# Patient Record
Sex: Female | Born: 1955 | Race: White | Hispanic: No | Marital: Married | Smoking: Former smoker
Health system: Southern US, Community
[De-identification: ages and names within clinical notes are randomized; demographics above are authoritative.]

## PROBLEM LIST (undated history)

## (undated) DIAGNOSIS — I4891 Unspecified atrial fibrillation: Secondary | ICD-10-CM

## (undated) DIAGNOSIS — J449 Chronic obstructive pulmonary disease, unspecified: Secondary | ICD-10-CM

---

## 2006-07-10 ENCOUNTER — Emergency Department: Payer: Self-pay | Admitting: Emergency Medicine

## 2006-08-14 ENCOUNTER — Emergency Department: Payer: Self-pay | Admitting: Emergency Medicine

## 2007-07-04 ENCOUNTER — Ambulatory Visit: Payer: Self-pay | Admitting: Emergency Medicine

## 2007-07-14 ENCOUNTER — Ambulatory Visit: Payer: Self-pay | Admitting: Internal Medicine

## 2007-10-04 ENCOUNTER — Ambulatory Visit: Payer: Self-pay | Admitting: Emergency Medicine

## 2007-10-07 ENCOUNTER — Ambulatory Visit: Payer: Self-pay | Admitting: Internal Medicine

## 2007-10-09 ENCOUNTER — Emergency Department: Payer: Self-pay | Admitting: Unknown Physician Specialty

## 2007-10-21 ENCOUNTER — Other Ambulatory Visit: Payer: Self-pay

## 2007-10-21 ENCOUNTER — Inpatient Hospital Stay: Payer: Self-pay | Admitting: Internal Medicine

## 2007-10-25 ENCOUNTER — Ambulatory Visit: Payer: Self-pay | Admitting: Gynecology

## 2007-11-05 ENCOUNTER — Ambulatory Visit: Payer: Self-pay | Admitting: Pulmonary Disease

## 2007-11-05 ENCOUNTER — Ambulatory Visit (HOSPITAL_COMMUNITY): Admission: RE | Admit: 2007-11-05 | Discharge: 2007-11-05 | Payer: Self-pay | Admitting: Obstetrics & Gynecology

## 2007-11-13 ENCOUNTER — Ambulatory Visit: Payer: Self-pay | Admitting: Pulmonary Disease

## 2007-11-28 DIAGNOSIS — R0989 Other specified symptoms and signs involving the circulatory and respiratory systems: Secondary | ICD-10-CM

## 2007-11-28 DIAGNOSIS — R0609 Other forms of dyspnea: Secondary | ICD-10-CM | POA: Insufficient documentation

## 2007-11-28 DIAGNOSIS — J45909 Unspecified asthma, uncomplicated: Secondary | ICD-10-CM | POA: Insufficient documentation

## 2007-11-28 DIAGNOSIS — E039 Hypothyroidism, unspecified: Secondary | ICD-10-CM | POA: Insufficient documentation

## 2007-12-18 ENCOUNTER — Ambulatory Visit: Payer: Self-pay | Admitting: Internal Medicine

## 2008-01-28 ENCOUNTER — Encounter (INDEPENDENT_AMBULATORY_CARE_PROVIDER_SITE_OTHER): Payer: Self-pay | Admitting: Gynecology

## 2008-01-28 ENCOUNTER — Inpatient Hospital Stay (HOSPITAL_COMMUNITY): Admission: RE | Admit: 2008-01-28 | Discharge: 2008-01-30 | Payer: Self-pay | Admitting: Gynecology

## 2008-01-28 ENCOUNTER — Ambulatory Visit: Payer: Self-pay | Admitting: Gynecology

## 2008-02-07 ENCOUNTER — Ambulatory Visit: Payer: Self-pay | Admitting: Gynecology

## 2008-02-14 ENCOUNTER — Ambulatory Visit: Payer: Self-pay | Admitting: Gynecology

## 2008-03-02 ENCOUNTER — Ambulatory Visit: Payer: Self-pay | Admitting: Internal Medicine

## 2008-04-15 ENCOUNTER — Ambulatory Visit: Payer: Self-pay | Admitting: Family Medicine

## 2008-06-04 IMAGING — CR DG CHEST 2V
1 series · 2 of 2 positions shown · non-contrast
Comparison: none

REASON FOR EXAM: short of breath
COMMENTS:

[Series 1: view not recorded · 0.17mm/px · 2 of 2 slices shown]
[im 1/2]
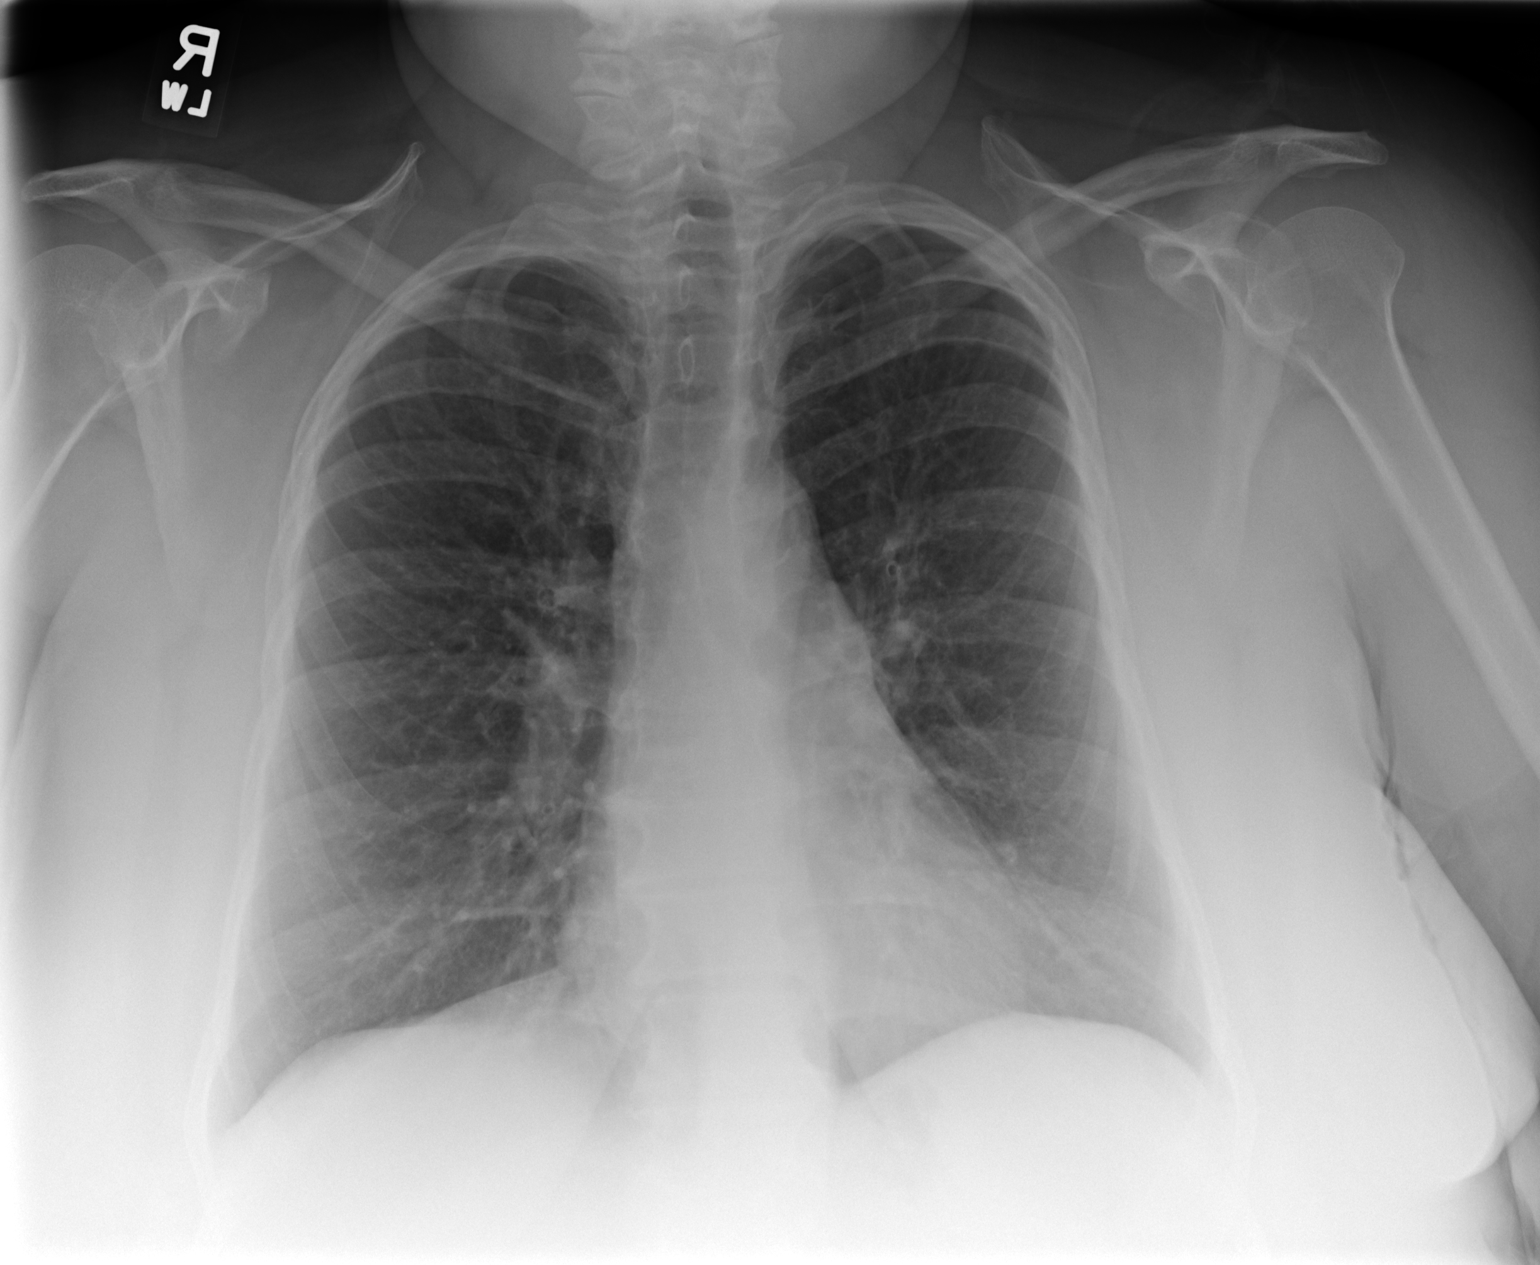
[im 2/2]
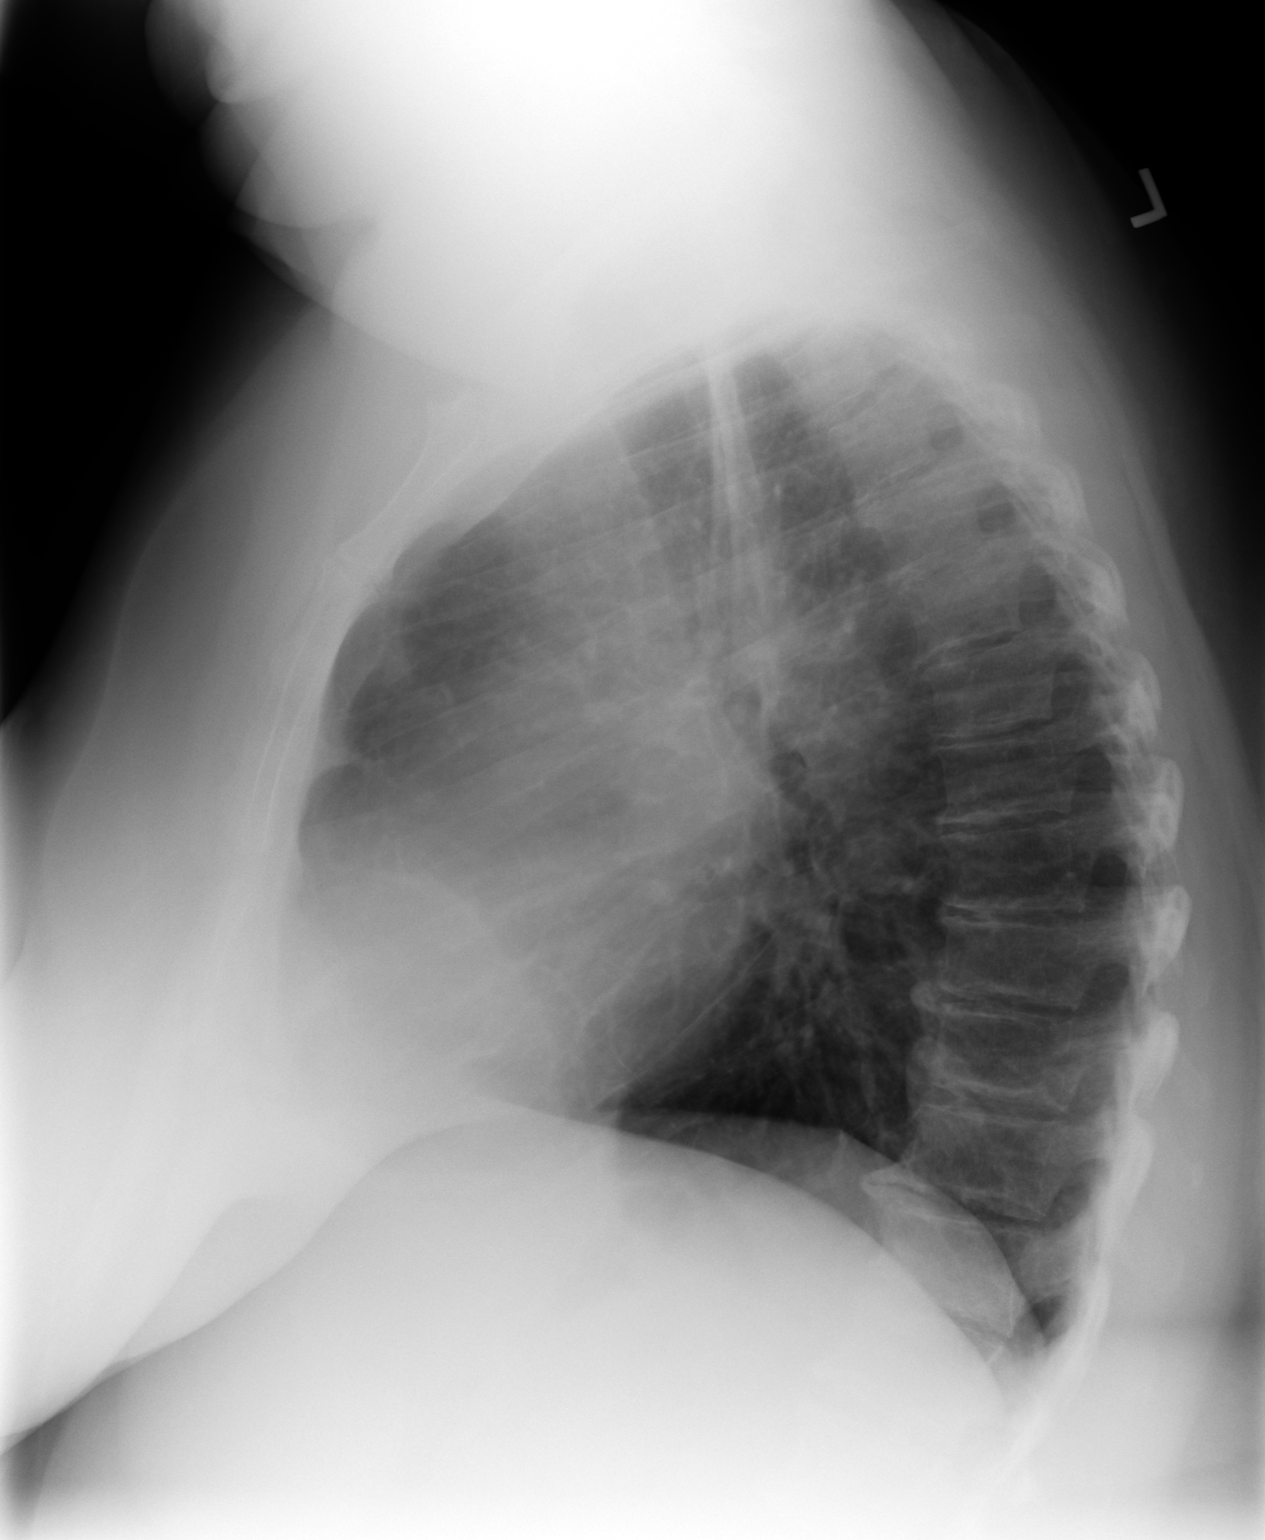

[2 of 2 positions shown; findings below may reference images not displayed]

PROCEDURE:     MDR - MDR CHEST PA(OR AP) AND LATERAL  - March 02, 2008 [DATE]

RESULT:     There is no previous exam for comparison. There is mild
hyperinflation. There are degenerative changes in the spine. The lungs are
clear. The heart and pulmonary vessels are normal. The bony and mediastinal
structures are unremarkable. There is no effusion. There is no pneumothorax
or evidence of congestive failure.
IMPRESSION: No acute cardiopulmonary disease.

## 2008-06-22 ENCOUNTER — Ambulatory Visit: Payer: Self-pay | Admitting: Family Medicine

## 2008-07-21 ENCOUNTER — Ambulatory Visit: Payer: Self-pay | Admitting: Internal Medicine

## 2009-05-17 ENCOUNTER — Emergency Department: Payer: Self-pay | Admitting: Emergency Medicine

## 2010-09-29 ENCOUNTER — Ambulatory Visit: Payer: Self-pay | Admitting: Family Medicine

## 2011-03-08 ENCOUNTER — Ambulatory Visit: Payer: Self-pay | Admitting: Internal Medicine

## 2011-04-26 NOTE — Op Note (Signed)
Tasha [name], Tasha [name]           ACCOUNT NO.:  [id number]   MEDICAL RECORD NO.:  [id number]          PATIENT TYPE:  INP   LOCATION:  [id number]                          FACILITY:  [facility]   PHYSICIAN:  Willey Blade, MD  DATE OF BIRTH:  16-Mar-1956   DATE OF PROCEDURE:  01/28/2008  DATE OF DISCHARGE:                               OPERATIVE REPORT   PREOPERATIVE DIAGNOSES:  Chronic pelvic pain and metrorrhagia.   POSTOPERATIVE DIAGNOSES:  Chronic pelvic pain and metrorrhagia.  Bilateral endometriosis of ovaries and tubes.  Appendiceal to right  ovarian adhesions.  Right ovarian cystic masses.   PROCEDURE:  Supracervical hysterectomy, bilateral salpingo-oophorectomy  and appendectomy.  Peritoneal washings.   SURGEON:  Dr. Burke Keels.   ASSISTANT:  Dr. Kalman Shan.   ESTIMATED BLOOD LOSS:  -200 mL.   COMPLICATIONS:  None immediate.   SPECIMEN:  Uterus, portion of cervix, right and left tubes, and ovaries  and appendix to pathology.   OPERATIVE FINDINGS:  Upon opening the abdomen, there was no ascitic  fluid noted.  Two multicystic lesions coming off the right ovary were  noted measuring 5 x 5 cm each.  There was endometriotic lesions on the  surface of the right ovary.  The left ovary demonstrated endometrioma  which had opened during the course of the procedure.  The appendix was  adherent to the right ovary and had to be dissected away.  Due to the  patient's significantly elevated BMI, a supracervical hysterectomy was  performed; due to restrictions in safe exposure deep in the pelvis and  the risk of heavy bleeding that could not be controlled, had the  procedure continued to a total abdominal hysterectomy.  Upper abdomen  was normal.  Gallbladder previously removed.  Periaortic and pelvic  lymph nodes were palpated and found to be normal.  Both ureters were  identified throughout their pelvic course during the procedure.  The two  ovarian cystic lesions did not demonstrate surface  excrescences and  appeared to be clear, and frozen section demonstrated fibroadenoma.  This was performed on the right ovary.  The left ovary was an  endometrioma and pathologist deferred frozen section.  The uterus  appeared to be slightly enlarged.   OPERATIVE PROCEDURE:  The patient was prepped and draped in usual  fashion and placed in the supine position.  Betadine solution used for  antiseptic, and the patient was catheterized prior to procedure.  After  adequate general anesthesia, a vertical infraumbilical incision was made  and the abdomen opened.  Self-retaining retractors using a Bookwalter  were utilized.  At this point, the round ligaments were clamped, cut and  ligated with 0 Vicryl suture.  The lateral peritoneal reflection was  opened and the ureters identified.  Both infundibulopelvic ligaments  were bilaterally clamped, cut and ligated with 0 Vicryl suture.  At this  point, the anterior and posterior peritoneal reflections were opened.  Bladder was developed in a standard Richardson fashion.  The uterine  vasculature was clamped and ligated with 0 Vicryl suture.  As described  above due to restriction in visualization, a supracervical hysterectomy  was performed.  The cervix was oversewn with 0 Vicryl running  interlocking suture.  Bleeding points hemostatically checked.  Copious  irrigation utilized.  No active bleeding noted.   Appendectomy performed by clamping and cutting and tying the  mesoappendix to the base.  At this point, the base of the appendix was  tied with 0 Vicryl suture x2, and afterwards, the appendix was cut above  said suture knots, leaving a small tip.  Tip was cleansed with iodine  and then copious irrigation with lactated Ringer's followed.  No active  bleeding in the mesoappendix was noted.  Reinspection of the pelvis  revealed no active bleeding.  All irrigant removed.  Closure of the  fascia with a double loop of 0 PDS running from either end  to midline.  A Jackson-Pratt drain was utilized for the subcutaneous layer.  Subcutaneous layer closed with 0 plain catgut suture and skin staples  for the skin.  Instrument and sponge count were correct.  The patient  tolerated the procedure well and returned to the post-anesthesia  recovery room in excellent condition.      Willey Blade, MD  Electronically Signed     SHB/MEDQ  D:  01/28/2008  T:  01/29/2008  Job:  [id number]

## 2011-04-26 NOTE — Assessment & Plan Note (Signed)
Tasha [name]                             PULMONARY OFFICE NOTE   OLUWASEUN, TANDE                    MRN:          [id number]  DATE:11/05/2007                            DOB:          11-10-1956    HISTORY OF PRESENT ILLNESS:  The patient is a 55 year old white female,  whom I have been asked to see for preop pulmonary clearance for total  abdominal hysterectomy.  The patient has a long history of smoking as  well as asthma and also morbid obesity.  By the patient's history, she  had an episode of pneumonia approximately 3 weeks ago and was  hospitalized at [facility].  We do have a copy of an x-ray that showed a  right middle lobe infiltrate per report.  The patient feels that she is  much improved and is almost back to her usual baseline.  She is having  no fevers, chills, sweats, or purulence.  The patient, at baseline, has  less than 1 block dyspnea on exertion at a moderate pace.  She does not  get short of breath vacuuming her house but will bringing groceries in  from the car.  She has no significant cough or mucus production at this  time.  She does have chronic lower extremity edema for which she is on  diuretics.   PAST MEDICAL HISTORY:  1. Asthma.  2. History of multiple orthopedic procedures.  3. History of hypothyroidism for which she is on replacement.   CURRENT MEDICATIONS:  1. Advair 250/50, 1 b.i.d.  2. Ipratropium and albuterol nebulizer treatments q.i.d. p.r.n.  3. Nexium 40 mg daily.  4. Lasix 40 mg b.i.d.  5. Synthroid of unknown dose daily.   She has no known drug allergies.   SOCIAL HISTORY:  She is separated.  She has a history of smoking 1-2  packs per day for approximately 18 years.  She is currently smoking 1/2  pack a day of cigarettes.  She lives alone.   FAMILY HISTORY:  Remarkable for her father having asthma and otherwise  noncontributory in first-degree relatives.   REVIEW OF SYSTEMS:  As per history of  present illness.  Also, see  patient intake form documented in the chart.   PHYSICAL EXAM:  GENERAL:  She is a morbidly obese white female in no  acute distress.  Blood pressure is 128/74, pulse 82, temperature 97.8, weight 344 pounds.  She is 5 feet 1 inch tall.  O2 saturation on room air is 96%.  HEENT:  Pupils equal, round, and reactive to light and accommodation.  Extraocular muscles are intact.  Nares are patent without discharge but  narrowed.  Oropharynx is clear.  NECK:  Supple without JVD or lymphadenopathy.  There is no palpable  thyromegaly.  CHEST:  Clear to auscultation except faint basilar crackles.  CARDIAC:  Regular rate and rhythm.  ABDOMEN:  Large but nontender with good bowel sounds.  GENITAL/RECTAL/BREAST EXAM:  Not done and not indicated.  LOWER EXTREMITIES:  2+ edema.  Pulses are intact distally.  NEUROLOGIC:  Alert and oriented with no obvious motor deficits.  IMPRESSION:  Dyspnea on exertion secondary to morbid obesity and  probable underlying obstructive lung disease.  In terms of her  postoperative risks, I am most concerned about her morbid obesity more  so than her smoking and possible obstructive lung disease.  I do think  she needs PFTs to give me some idea as to her risks from a pulmonary  standpoint.  I have explained to the patient that it is absolutely  essential that she quit smoking 2 weeks prior to any form of general  anesthesia or other surgeries.  I have also asked her to work  aggressively on losing weight.  Again, I think her morbid obesity is  probably more of a risk than her actual pulmonary status.   PLAN:  1. Full PFTs as soon as possible.  2. Work on weight loss.  3. Stop smoking at least 2 weeks prior to surgery.  4. The patient will obviously need early out-of-bed, work with      incentive spirometry, as well as aggressive deep venous thrombosis      prophylaxis.  5. The patient will follow up as soon as her PFTs are done.      Kathee Delton, MD,FCCP  Electronically Signed    [facility]/MedQ  DD: 11/05/2007  DT: 11/05/2007  Job #: [id number]   cc:   Willey Blade, MD

## 2011-04-26 NOTE — H&P (Signed)
Tasha, [name]           ACCOUNT NO.:  [id number]   MEDICAL RECORD NO.:  [id number]          PATIENT TYPE:  AMB   LOCATION:  [place]                           FACILITY:  [facility]   PHYSICIAN:  Willey Blade, MD  DATE OF BIRTH:  [birth year]   DATE OF ADMISSION:  12/20/2007  DATE OF DISCHARGE:                              HISTORY & PHYSICAL   REASON FOR ADMISSION:  Leiomyomatous uterus and menometrorrhagia.   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  The patient states  that her menses last anywhere from 7-14 days over a 30-day cycle.  Because of her hypertension and heavy smoking history, she is not a  candidate for oral contraceptives.  She takes no medication to enhance  her bleeding propensity and has no personal family history of bleeding  diatheses.  Endometrial biopsy reveals no evidence of neoplasia or  hyperplasia and ultrasound confirms a 14-16-week leiomyomatous uterus.   OB/GYN HISTORY:  The patient has had three vaginal deliveries in the  past and currently has no method of contraception.   MEDICAL HISTORY:  The patient has a known asthma and hypertension   PAST SURGICAL HISTORY:  Right arm surgery, right foot surgery and open  cholecystectomy and skin graft on her right arm.   ALLERGIES:  None.   CURRENT MEDICATIONS:  1. Nexium 40 mg daily.  2. Albuterol inhaler 2 puffs IV 6 hours as needed.  3. Furosemide 40 mg per day.  4. Advair inhaler use this p.r.n.  5. Prednisone dose unknown.  She takes one daily on a tapered dose.  6. Ventolin inhaler 1-2 puffs up to four times a day.  7. Hydrochlorothiazide 50 mg daily.   SOCIAL HISTORY:  The patient is separated. She smokes about a half-pack  of cigarettes a day down from two packs daily. She  drinks rarely alcohol  and does not abuse illicit drugs.   FAMILY HISTORY:  Positive for coronary artery disease and her mother  having had a myocardial infarction in her 71s.   PHYSICAL EXAMINATION:  A pleasant-appearing female in no acute distress.  Weight is 342 pounds, blood pressure 160/90, heart rate is 70 and  regular.  HEENT:  Grossly normal.  BREASTS:  Examined without mass or discharge, thickenings or tenderness.  CHEST:  Clear to percussion and auscultation.  CARDIOVASCULAR:  Exam without murmurs or enlargements.  Regular rate and  rhythm.  Extremity, lymphatic, skin, neurological and musculoskeletal  systems  within normal limits.  ABDOMEN:  Soft without gross hepatosplenomegaly. Palpable  lower pelvic  mass consistent with leiomyomata at about 16 weeks size. Pelvic exam  normal Pap smear.  External genitalia, vulva and vagina normal.  Cervix smooth without  erosions or lesions.  The uterus is about 16 weeks in size, difficult to  evaluate due to her size.  Both adnexa palpable, again difficult to  evaluate but otherwise appears to be  within normal limits.   CONSULTATIONS:  The patient was referred to Dr. Verlon Setting from [place] for clearance for surgery.  The patient underwent  a technetium nuclear cardiology report with an overall impression of a  low-risk stress nuclear study, no evidence of inducible ischemia and  normal left ventricular systolic function with an ejection fraction of  63%. The patient denies chest pain on exertion.  She also underwent  pulmonary function testing which demonstrated mild air flow obstruction  with no change bronchodilators. Lung volumes were normal with mild air  trapping.   The patient was advised by pulmonary to stop smoking and also was asked  to refrain from smoking postoperatively.   IMPRESSION:  1. Abnormal uterine bleeding.  2. Leiomyomatous uterus.   PLAN:  The patient is not a candidate for  endometrial ablation due to an  enlarged uterus and is not a candidate for oral contraception secondary  to her hypertension and smoking history. In addition, she declined would  not be a candidate for Mirena intrauterine device due to her uterine  size.  The patient accepted and agreed and was cleared for a total  abdominal hysterectomy and bilateral salpingo-oophorectomy.  The nature  of said procedure discussed in detail. The risks including possible  injuries to ureter, bowel and bladder, hemorrhage possibly requiring a  blood transfusion, infection, cardiopulmonary complications  intraoperatively or postoperatively and infection were discussed and  understood by said patient.  The patient verbalized understanding of  said procedure, nature of risks and benefits.      Willey Blade, MD  Electronically Signed     SHB/MEDQ  D:  12/17/2007  T:  12/17/2007  Job:  [id number] [id number]

## 2011-04-26 NOTE — Discharge Summary (Signed)
Tasha [name], [name]           ACCOUNT NO.:  [id number]   MEDICAL RECORD NO.:  [id number]          PATIENT TYPE:  INP   LOCATION:  [id number]                          FACILITY:  [facility]   PHYSICIAN:  Willey Blade, MD  DATE OF BIRTH:  03-08-1956   DATE OF ADMISSION:  01/28/2008  DATE OF DISCHARGE:  01/30/2008                               DISCHARGE SUMMARY   REASON FOR HOSPITALIZATION:  Chronic pelvic pain, menometrorrhagia.   IN-HOSPITAL PROCEDURES:  1. Supracervical hysterectomy, bilateral salpingo-oophorectomy.  2. Appendectomy.   FINAL DIAGNOSIS:  Chronic pelvic pain, menometrorrhagia, left ovarian  endometrioma, right ovarian cysts and appendiceal and right ovarian  adhesive disease.   HOSPITAL COURSE:  This patient is a 55 year old Caucasian female who  underwent the aforementioned procedure on January 28, 2008.  Of note is  that the patient demonstrated evidence for two large cystic masses  measuring about 4 to 5 cm attached to the right ovary.  The frozen  section return as benign tissue without evidence for borderline or  malignant tumor.  An endometrioma was noted in the left ovary after an  inadvertent rupture and dark chocolate cystic material was noted.  Her  appendix was affixed to the right ovary and was therefore removed.  Her  postoperative course was uneventful.  She was afebrile.  Creatinine 0.87  postoperatively and her H and H was 36.2 and 12.1.  She had minimal  Jackson-Pratt drainage from the subcutaneous site.  The abdomen was  soft.  The incisions were dry.  Calves without tenderness.  The patient  was passing flatus at the time of discharge.   The discharge planning included sending the patient home with Percocet  5/325 1-2 every 4-6 hours and Keflex  500 mg twice daily.  She will continue her home medications including  Lasix 40 mg daily, Nexium 40 mg daily, Advair 250/50 daily, Albuterol,  Ipratropium nebulizer treatments as needed, K-Lor/Con 20 mEq  daily,  Hydroxyzine 500 mg daily, __________ 30 mg daily.  The patient was  advised to contact the office for temperature elevation above 100.4  degrees Fahrenheit, increasing abdominal or incisional pain, increasing  Jackson-Pratt drainage, vaginal bleeding and/or any other concerns.  She  is scheduled to return to the [facility] office on the 26th of  February for removal of her Jackson-Pratt drain and staples.  Patient  verbalized understanding of all instructions and understood instructions  as well.      Willey Blade, MD  Electronically Signed     SHB/MEDQ  D:  01/30/2008  T:  01/30/2008  Job:  [id number]

## 2011-05-09 ENCOUNTER — Ambulatory Visit: Payer: Self-pay | Admitting: Internal Medicine

## 2011-05-09 ENCOUNTER — Ambulatory Visit: Payer: Self-pay | Admitting: Family Medicine

## 2011-07-19 ENCOUNTER — Ambulatory Visit: Payer: Self-pay | Admitting: Internal Medicine

## 2011-08-03 ENCOUNTER — Ambulatory Visit: Payer: Self-pay | Admitting: Family Medicine

## 2011-08-03 ENCOUNTER — Inpatient Hospital Stay: Payer: Self-pay | Admitting: Internal Medicine

## 2011-09-02 LAB — COMPREHENSIVE METABOLIC PANEL
ALT: 17
AST: 16
Alkaline Phosphatase: 42
BUN: 10
CO2: 25
Calcium: 9.1
Chloride: 102
GFR calc Af Amer: >60
GFR calc non Af Amer: >60
Sodium: 136
Total Protein: 7.1

## 2011-09-02 LAB — CBC
HCT: 36.2
MCHC: 33.2
MCHC: 33.4
RBC: 5.29 — ABNORMAL HIGH
RDW: 16.8 — ABNORMAL HIGH
RDW: 16.6 — ABNORMAL HIGH
WBC: 10

## 2011-09-02 LAB — BASIC METABOLIC PANEL
BUN: 10
Creatinine, Ser: 0.87
GFR calc non Af Amer: >60

## 2011-09-02 LAB — HCG, SERUM, QUALITATIVE: Preg, Serum: NEGATIVE

## 2011-09-02 LAB — TYPE AND SCREEN: ABO/RH(D): A POS

## 2012-10-28 ENCOUNTER — Emergency Department: Payer: Self-pay | Admitting: Emergency Medicine

## 2012-12-02 ENCOUNTER — Emergency Department: Payer: Self-pay | Admitting: Emergency Medicine

## 2012-12-02 LAB — CBC
HCT: 39.7 % (ref 35.0–47.0)
HGB: 12.6 g/dL (ref 12.0–16.0)
MCHC: 31.7 g/dL — ABNORMAL LOW (ref 32.0–36.0)
RDW: 16.2 % — ABNORMAL HIGH (ref 11.5–14.5)

## 2012-12-02 LAB — BASIC METABOLIC PANEL
Calcium, Total: 9.3 mg/dL (ref 8.5–10.1)
Co2: 27 mmol/L (ref 21–32)
EGFR (Non-African Amer.): 55 — ABNORMAL LOW
Potassium: 4.2 mmol/L (ref 3.5–5.1)
Sodium: 139 mmol/L (ref 136–145)

## 2013-03-28 ENCOUNTER — Inpatient Hospital Stay: Payer: Self-pay | Admitting: Internal Medicine

## 2013-03-28 LAB — DIFFERENTIAL: Monocytes: 3 %

## 2013-03-28 LAB — URINALYSIS, COMPLETE
Bilirubin,UR: NEGATIVE
Ph: 5 (ref 4.5–8.0)
RBC,UR: 4 /HPF (ref 0–5)
Squamous Epithelial: 24
WBC UR: 14 /HPF (ref 0–5)

## 2013-03-28 LAB — COMPREHENSIVE METABOLIC PANEL
Anion Gap: 10 (ref 7–16)
Bilirubin,Total: 0.6 mg/dL (ref 0.2–1.0)
Calcium, Total: 8.9 mg/dL (ref 8.5–10.1)
Co2: 24 mmol/L (ref 21–32)
EGFR (African American): 21 — ABNORMAL LOW
EGFR (Non-African Amer.): 18 — ABNORMAL LOW
Osmolality: 274 (ref 275–301)
SGPT (ALT): 9 U/L — ABNORMAL LOW (ref 12–78)
Sodium: 134 mmol/L — ABNORMAL LOW (ref 136–145)

## 2013-03-28 LAB — CK TOTAL AND CKMB (NOT AT ARMC)
CK, Total: 345 U/L — ABNORMAL HIGH (ref 21–215)
CK-MB: 1.9 ng/mL (ref 0.5–3.6)

## 2013-03-28 LAB — CBC
HCT: 34.5 % — ABNORMAL LOW (ref 35.0–47.0)
MCH: 24.4 pg — ABNORMAL LOW (ref 26.0–34.0)
RBC: 4.44 10*6/uL (ref 3.80–5.20)
RDW: 16.8 % — ABNORMAL HIGH (ref 11.5–14.5)

## 2013-03-28 LAB — TROPONIN I: Troponin-I: <0.02 ng/mL

## 2013-03-29 LAB — CBC WITH DIFFERENTIAL/PLATELET
Basophil #: 0 10*3/uL (ref 0.0–0.1)
Eosinophil %: 0.4 %
MCHC: 31.2 g/dL — ABNORMAL LOW (ref 32.0–36.0)
MCV: 78 fL — ABNORMAL LOW (ref 80–100)
Monocyte %: 2.3 %
Platelet: 311 10*3/uL (ref 150–440)
RBC: 4.05 10*6/uL (ref 3.80–5.20)
WBC: 29.6 10*3/uL — ABNORMAL HIGH (ref 3.6–11.0)

## 2013-03-29 LAB — BASIC METABOLIC PANEL
Anion Gap: 8 (ref 7–16)
Calcium, Total: 8.5 mg/dL (ref 8.5–10.1)
Co2: 26 mmol/L (ref 21–32)
EGFR (African American): 20 — ABNORMAL LOW
EGFR (Non-African Amer.): 17 — ABNORMAL LOW
Potassium: 4.1 mmol/L (ref 3.5–5.1)

## 2013-03-30 LAB — BASIC METABOLIC PANEL
Anion Gap: 14 (ref 7–16)
Calcium, Total: 7.7 mg/dL — ABNORMAL LOW (ref 8.5–10.1)
Chloride: 103 mmol/L (ref 98–107)
Co2: 21 mmol/L (ref 21–32)
Creatinine: 2.27 mg/dL — ABNORMAL HIGH (ref 0.60–1.30)
EGFR (African American): 27 — ABNORMAL LOW
EGFR (Non-African Amer.): 24 — ABNORMAL LOW
Glucose: 159 mg/dL — ABNORMAL HIGH (ref 65–99)
Sodium: 138 mmol/L (ref 136–145)

## 2013-03-30 LAB — CBC WITH DIFFERENTIAL/PLATELET
Basophil #: 0 10*3/uL (ref 0.0–0.1)
Basophil %: 0 %
Lymphocyte #: 1.5 10*3/uL (ref 1.0–3.6)
Lymphocyte %: 4.8 %
MCH: 24.7 pg — ABNORMAL LOW (ref 26.0–34.0)
Monocyte %: 2 %
RDW: 17.3 % — ABNORMAL HIGH (ref 11.5–14.5)
WBC: 31.5 10*3/uL — ABNORMAL HIGH (ref 3.6–11.0)

## 2013-03-30 LAB — URINE CULTURE

## 2013-04-03 LAB — CULTURE, BLOOD (SINGLE)

## 2013-05-15 ENCOUNTER — Ambulatory Visit: Payer: Self-pay | Admitting: Family Medicine

## 2013-08-28 ENCOUNTER — Encounter: Payer: Self-pay | Admitting: Surgery

## 2013-12-10 ENCOUNTER — Ambulatory Visit: Payer: Self-pay | Admitting: Internal Medicine

## 2014-12-08 ENCOUNTER — Ambulatory Visit: Payer: Self-pay | Admitting: Family Medicine

## 2015-01-08 ENCOUNTER — Encounter: Payer: Self-pay | Admitting: Surgery

## 2015-01-12 ENCOUNTER — Encounter: Payer: Self-pay | Admitting: Surgery

## 2015-01-13 ENCOUNTER — Encounter: Payer: Self-pay | Admitting: Surgery

## 2015-02-10 ENCOUNTER — Encounter: Payer: Self-pay | Admitting: Surgery

## 2015-04-03 NOTE — H&P (Signed)
PATIENT NAME:  Tasha Long, Tasha Long MR#:  [id number] DATE OF BIRTH:  Dec 06, 1956  DATE OF ADMISSION:  03/28/2013  CHIEF COMPLAINT: The patient presented to her primary care doctor's office and she was sent over here due to acute kidney injury, increased shortness of breath.   REFERRING PHYSICIAN:  Dr. Renard Hamper   PRIMARY CARE PHYSICIAN:  [facility] HISTORY OF PRESENT ILLNESS: The patient is a 59 year old female with history of multiple problems including chronic obstructive pulmonary disease, chronic respiratory failure, anxiety disorder, morbid obesity, sleep apnea, GERD, hypertension, who comes with history of having diarrhea for the past 2 nights, 5 times last night for which she was concerned of being dehydrated. She started having some significant shortness of breath and cough with sputum last night, so she went to visit her primary care physician. During the primary care physician evaluation, it was determined the patient was dehydrated and had acute kidney injury for which she was referred to the ER. Here in the ER, the patient is telling me that  for the past 24 hours to 48 hours, she has been feeling more short of breath with increased cough and significant sputum, which is thick and she has not been able to see it because she swallows, but it feel really thick and difficult to cough up. The patient has no fever, but she is feeling occasional chills and she is concerned because she has not urinated much within the past 48 hours. The patient denies any significant chest pain. She is here in company of her daughter and she is a very poor historian. She is hard to hear, but overall she is very cooperative. I was asked to admit the patient because of mostly severe symptoms of hypoxemia. Her oxygen saturation apparently at the clinic was on the low 80s. Here, the lowest we are seeing is around 90 on the monitor while I am talking to her. She is on 4 liters of oxygen at home. She is supposed to be taking  0.5 liters of oxygen continuously, but the patient does not. She only uses it occasionally at night. She states she has been very compliant with the CPAP.   REVIEW OF SYSTEMS: CONSTITUTIONAL: Denies any fever, but the patient is having occasional chills. No significant weakness or weight loss or weight gain.  EYES: No blurry vision, inflammation or changes in eyesight.  ENT: No tinnitus. No ear pain. No significant difficulty swallowing.   RESPIRATORY: Positive cough. Positive sputum, positive wheezing. Negative hemoptysis. Positive dyspnea. Apparently the patient cannot get out of bed without being short of breath, huffing and puffing all the time. She gets around with no walker or cane, but she is always short of breath.  CARDIOVASCULAR:  No orthopnea, no chest pain. No palpitations. No syncopal episodes. No significant edema, although the patient has chronic lymphedema. She has not noticed any changes in that.   GASTROINTESTINAL: No nausea, vomiting. Positive diarrhea, 5 times last night. No abdominal pain. No jaundice. No rectal bleeding or melena.  GENITOURINARY: No dysuria or hematuria. Positive decreased urinary output.  GYNECOLOGIC: No breast masses. No vaginal bleeding.  ENDOCRINE: No polyuria, polydipsia or polyphagia. No cold or heat intolerance.  HEMATOLOGIC AND LYMPHATIC: No anemia, easy bruising or bleeding.  INTEGUMENTARY:  No rashes or petechiae. She does have some changes of chronic vascular disease and lymphedema of the lower extremities.  MUSCULOSKELETAL: No significant gout, knee pain or edema of joints.  NEUROLOGIC: No numbness, tingling. No CVA or transient ischemic attack.  PSYCHIATRIC: No insomnia, depression or agitation   PAST MEDICAL HISTORY: 1.  Hearing loss.  2.  GERD.  3.  Hypothyroidism.  4.  Osteoarthritis.  5.  Chronic back pain.  6.  Vascular insufficiency.  7.  Morbid obesity.  8.  Hypertension.  9.  Chronic obstructive pulmonary disease.  10.  Chronic  respiratory failure, supposed to be on oxygen 1.5 liters continuously.  11.  Hyperlipidemia.  12.  Lower extremity lymphedema.  13.  Tobacco abuse.  14.  Irritable bowel syndrome.  15.  Obstructive sleep apnea.  16.  Bipolar disorder.  17.  Chronic kidney disease stage III,  18.  Anxiety.    PAST SURGICAL HISTORY: 1.  Appendectomy.  2.  Cholecystectomy.  3.  Hysterectomy.  4.  Right arm surgery.   ALLERGIES: THE PATIENT STATES SHE IS ALLERGIC TO ASPIRIN, MORPHINE.   SOCIAL HISTORY: The patient continues to smoke. She smokes 1/2 pack a day. Counseling for smoking cessation was given for over 5 minutes. The patient states that she is going to quit today. She states she does not require any help at this moment. Denies any alcohol use. Denies any illicit drug use. The patient is on home oxygen, but she does not use it, she has a CPAP. She uses it every night. The patient ambulates without any walkers or canes. She denies any falls, and she is disabled.  Her daughter states she has been disabled since she was a little.   FAMILY HISTORY: Positive for hypertension, but no MIs or coronary artery disease or cancer.   CURRENT MEDICATIONS:  1.  Albuterol, Atrovent nebs.  2.  Symbicort 160/4.5 mg 2 puffs twice daily.  3.  Cholestyramine.  4.  Voltaren 75 mg 2 times a day.  5.  Synthroid 50 mcg once daily.  6.  Lisinopril/hydrochlorothiazide 20/25 once a day.  7.  Omeprazole 20 mg once a day.  8.  Paroxetine 40 mg once a day.  9.  Spiriva 18 mcg once a day.  10.  Trazodone 150 mg once a day.  11.  Triamcinolone cream for skin.     PHYSICAL EXAMINATION: VITAL SIGNS: Blood pressure 89/44, respirations of 36, pulse 115, temperature 98, oxygen saturation 92% on 4 liters nasal cannula, but it was down to the mid-80's to primary care.   GENERAL:  Alert, oriented x3. Positive respiratory distress with tachypnea. Positive occasional use of accessory muscles, especially when she talks. The patient is  cooperative and very hard to hear.  HEENT:  Pupils are equal and reactive. Extraocular movements are intact. Anicteric sclerae. Pink conjunctivae. Mucosa are dry. No oral lesions. No thrush. No oropharyngeal exudates.  NECK: Supple. No JVD. No thyromegaly. No adenopathy. Obese. Trachea central. No palpable masses. No rigidity.  CARDIOVASCULAR: Regular rate and rhythm. No murmurs, rubs or gallops are appreciated at this moment, but the patient is tachycardic. No displacement of PMI, although it is difficult to palpate due to severe obesity. No tenderness to palpation anterior chest wall.  LUNGS: Decreased respiratory sounds on bilateral bases. This is likely due to body habitus, but  I can hear some rales, mostly on the right middle lobe. There is also some rhonchi, diffuse in both lungs. Positive use of accessory muscles.  ABDOMEN: Soft, nontender, nondistended. No hepatosplenomegaly, obese abdomen.  GENITAL: Negative for external lesions.  EXTREMITIES: Positive chronic lymphedema, no pitting edema. Normal pulses +2. There are some changes of the skin of the lower extremities  related to lymphedema with  lichenification and also due to chronic venous insufficiency.  NEUROLOGIC: Cranial nerves II through XII intact. Strength is 5/5 on 4 extremities.  PSYCHIATRIC: Mood is normal without any signs of agitation. The patient is alert and oriented x 3. Very hard to hear, difficult communication, but overall she communicates well with her daughter and she "translates."  LYMPHATICS: Negative for lymphadenopathy in neck or supraclavicular areas.  MUSCULOSKELETAL: No significant joint effusions or exudates in joints.   LABORATORY, DIAGNOSTIC AND RADIOLOGICAL DATA:  EKG: Normal sinus rhythm. No ST depression or elevation.   Chest x-ray: Difficult to evaluate due to body habitus, although the patient has significant patient changes that are maybe equivalent to beginning infiltrate of the left lower lung, could be  also atelectasis.   Glucose 98, BUN 28, creatinine 2.8, sodium 134, potassium 4.0. GFR is around 20. Albumin 2.2, AST 18, alkaline phosphatase is 44, total CK 345. Troponin 0.02. White count 33,000. The patient states she has not gotten any steroids in months. Hemoglobin is 10.9, hematocrit 34, platelets 367.   ASSESSMENT AND PLAN: A 59 year old female with history of chronic obstructive pulmonary disease, obstructive sleep apnea, morbid obesity, hypertension, gastroesophageal reflux disease, hypothyroidism, hearing loss, comes with worsening shortness of breath, diarrhea and acute kidney injury.  1.  Systemic inflammatory response syndrome, likely due to pneumonia elevated white blood count is evidence of it with  33,000 white blood cells.  She is also tachypneic, tachycardic and hypotensive with blood pressures at the beginning in the 40s/ 60s. IV fluids have been given and now are better. The patient started on broad-spectrum antibiotics. Blood cultures, sputum cultures, urine cultures have been taken.  2.  Acute on chronic respiratory failure. The patient is supposed to be taking 1.5 liters of oxygen for baseline now, but she is not taking it. The patient is now on 4 liters of oxygen. Her oxygen saturations are around 90. We will titrate to keep oxygen saturations between 88 to 92, due to possible chronic retention.  3.  Pneumonia. The patient has severe chronic obstructive pulmonary disease, elevation of white blood cells. She has been given Levaquin but based on risk factors, I am going to add on Zosyn. She is getting  blood sputum and blood cultures, and we are going to do streptococcus antibodies of urine. Continue nebulizers and pulmonary started. 4.  Chronic obstructive pulmonary disease. We are going to add on steroids for this patient and continue Symbicort.  5.  Acute kidney injury. The patient needs to stop taking Voltaren.  IV fluids given. The patient was hypotensive, now her blood pressure  is better. We continue NS at 90 an hour. The patient already got 1 liter.  6.  Obstructive sleep apnea. Continue CPAP.  7.  Hypertension. Continue to watch at this moment. I am going to hold on her medications, which are an ACE inhibitor and thiazide diuretics for what this could worsen her kidney injury. 8. Diarrhea. The patient has beginning of diarrhea. We are going to get white blood cells on a stool and Clostridium difficile testing right now. We are going to hold on her cholestyramine as this could cause worsening of diarrhea.   CODE STATUS: The patient is no code DO NOT RESUSCITATE. She does not have any documents, but she would like to do that. So, we asked social worker to help [place] with documentation.   TIME SPENT: I spent about 50 minutes with this admission.     ____________________________  Sink, MD rsg:cc  D: 03/28/2013 18:24:01 ET T: 03/28/2013 19:18:35 ET JOB#: [id number]  cc: Felt Sink, MD, <Dictator> Mai Longnecker America Brown MD ELECTRONICALLY SIGNED 03/29/2013 13:39

## 2015-04-03 NOTE — Discharge Summary (Signed)
PATIENT NAME:  Tasha Long, Tasha Long MR#:  [id number] DATE OF BIRTH:  1956-06-01  DATE OF ADMISSION:  03/28/2013 DATE OF DISCHARGE:  03/30/2013  DISCHARGE DIAGNOSES:  1. Pneumonia.  2. Acute on chronic respiratory failure due to chronic obstructive pulmonary disease exacerbation.  3. Acute on chronic renal failure due to dehydration, improving.  4. Leukocytosis.  5. Morbid obesity.   CODE STATUS ON DISCHARGE: Do not resuscitate.  MEDICATION ON DISCHARGE: 1. Ventolin 2 puff inhalation 4 times a day. 2. Spiriva 18 mcg inhalation capsule once a day. 3. Symbicort 2 puff inhalation 2 times a day.  4. Albuterol and ipratropium 3 mL inhalation 4 times a day.  5. Prilosec 20 mg oral delayed-release capsule once a day.  6. Trazodone 150 mg oral tablet once a day.  7. Triamcinolone apply topically to affected area 2 times a day.  8. Synthroid 50 mcg 1 tablet once a day. 9. Paxil 40 mg oral tablet once a day. 10. Questran 4 grams oral powder for reconstitution once a day. 11. Diclofenac sodium 75 mg oral delayed-release tablet 2 times a day. 12. Prednisone 10 mg oral tablet, start 60 mg daily and taper by 10 mg until complete. 14. Cephazolin 500 mg oral tablet 2 times a day for 4 days. 15. Nicotine patch 21 mg 1 patch transdermal once a day for 15 days.   DIET ON DISCHARGE: Low sodium, low fat, low cholesterol diet.   DIET CONSISTENCY: Regular.   ACTIVITY LIMITATION: As tolerated.   TIMEFRAME TO FOLLOWUP: In 1 to 2 weeks with primary care physician about kidney function.   PRIMARY CARE PHYSICIAN: [name], MD   HISTORY OF PRESENTING ILLNESS: A 59 year old female with multiple medical problems, chronic obstructive pulmonary disease, chronic respiratory failure, anxiety, morbid obesity, sleep apnea, gastroesophageal reflux disease, hypertension, who came with history of having diarrhea for 2 nights, 5 times on the previous night. She was concerned of being dehydrated. She started having  some significant shortness of breath, cough with sputum, so she went to visit her primary care physician. During her primary care physician visit, she was dehydrated and had kidney injuries. She was referred to the Emergency Room, feeling more shortness of breath and cough and significant sputum which was thick. She was feeling occasional chills and was not urinating much for the last 48 hours before presentation. Denied any significant chest pain. Oxygen saturation in the clinic was lower 80. She was advised to have supplemental oxygen at home, but she was not using it, and she was only using CPAP at night at home.   HOSPITAL COURSE AND STAY:  1. Systemic inflammatory response syndrome due to pneumonia, evident by elevated white cell count, tachypnea, tachycardia. She received IV fluids. Blood pressure became stable and broad-spectrum antibiotic to cover for pneumonia.  2. Acute on chronic respiratory failure. She was initially on 4 liters oxygen supplementation, which came down to 2 to 3 liters after treatment of pneumonia.  3. Pneumonia. She was on Zosyn, and after improvement, discharged on cephazolin.   4. Chronic obstructive pulmonary disease. She was on IV steroids and Symbicort and inhalers. 5. Acute kidney injury due to dehydration secondary to pneumonia. IV fluid was given, and her kidney function was improving slowly and gradually. She was advised to follow with her primary care physician after discharge for kidney function. 6. Obstructive sleep apnea. Continued CPAP in the hospital at night.  7. Hypertension. No medication for hypertension and as she was hypotensive and she had  kidney injury.  8. Diarrhea. This complaint resolved after presentation in the hospital, and no intervention was done for that.   IMPORTANT LABORATORY RESULTS IN THE HOSPITAL: On presentation, her creatinine was 2.81, which went up to 2.94 and came down to 2.27 after hydration. Total white cell count was 33,000, which  came down to 31,000 on discharge. Hemoglobin was 10.9, came down to 9, possibly due to dehydration and dilation. Platelet count remained stable at 367. Blood cultures negative. Urine culture was suggestive of possible contamination. She had 14 WBCs on urinalysis and 1+ leukocyte esterase. Chest x-ray: Left lower lobe atelectasis, left pleural effusion and borderline cardiomegaly on presentation. On the day of discharge, probable left lower lobe pneumonia.  TOTAL TIME SPENT ON THIS DISCHARGE: 45 minutes.   ____________________________ Ceasar Lund Anselm Jungling, MD vgv:OSi D: 04/01/2013 20:42:37 ET T: 04/02/2013 06:04:56 ET JOB#: [id number]  cc: Ceasar Lund. Anselm Jungling, MD, <Dictator> [name], MD Vaughan Basta MD ELECTRONICALLY SIGNED 04/06/2013 22:46

## 2015-04-20 ENCOUNTER — Ambulatory Visit: Admission: RE | Admit: 2015-04-20 | Payer: Self-pay | Source: Ambulatory Visit | Admitting: Gastroenterology

## 2015-04-20 ENCOUNTER — Encounter: Admission: RE | Payer: Self-pay | Source: Ambulatory Visit

## 2015-04-20 SURGERY — COLONOSCOPY
Anesthesia: Monitor Anesthesia Care

## 2015-04-29 ENCOUNTER — Ambulatory Visit: Payer: Medicaid Other | Admitting: Occupational Therapy

## 2015-05-20 ENCOUNTER — Ambulatory Visit: Payer: Medicaid Other | Attending: Family Medicine | Admitting: Occupational Therapy

## 2015-05-20 DIAGNOSIS — I890 Lymphedema, not elsewhere classified: Secondary | ICD-10-CM | POA: Diagnosis not present

## 2015-05-20 NOTE — Therapy (Signed)
[place] MAIN [facility] SERVICES [address] [place], [place], [address] Phone: [phone]   Fax:  [phone]  Occupational Therapy Evaluation  Patient Details  Name: Tasha Long MRN: [id number] Date of Birth: [birth year] Referring Provider:  Lawanna Kobus, MD  Encounter Date: 05/20/2015      OT End of Session - 05/20/15 1808    Visit Number 1   Number of Visits 3   Date for OT Re-Evaluation 08/18/15   OT Start Time [id number]   OT Stop Time 1520   OT Time Calculation (min) 75 min   Activity Tolerance Patient tolerated treatment well   Behavior During Therapy [facility] for tasks assessed/performed      No past medical history on file.  No past surgical history on file.  There were no vitals filed for this visit.  Visit Diagnosis:  Lymphedema      Subjective Assessment - 05/20/15 1730    Subjective  Pt presents with daughter, Tasha Long, for OT lymphedema evaluation. Pt reports onset of BLE when she was a child of ~ 89 yo. She reports her mother had a hx of severe leg swelling, and both daughters have swelling in their legs.   Patient is accompained by: Family member   Pertinent History hx non healing LLE wounds   Patient Stated Goals get the swelling in my legs down and keep it from getting worse so I dont get an infection or another wound.   Currently in Pain? No/denies             LYMPHEDEMA/ONCOLOGY QUESTIONNAIRE - 05/20/15 1800    What other symptoms do you have   Are you Having Heaviness or Tightness Yes   Are you having pitting edema No   Do you have infections Yes   Is there Decreased scar mobility Yes   Other Symptoms elephantiasis, hx lymphorrhea and non healing BLE wounds   Lymphedema Stage   Stage STAGE 3 ELEPHANTIASIS   Lymphedema Assessments   Lymphedema Assessments Lower extremities                       OT Education - 05/20/15 1804    Education provided Yes   Person(s) Educated  Patient;Child(ren)   Methods Explanation;Demonstration;Tactile cues;Verbal cues;Handout   Comprehension Verbalized understanding;Need further instruction;Tactile cues required             OT Long Term Goals - 05/20/15 1826    OT LONG TERM GOAL #1   Title Pt min A w/ LE precautions by DC  as evidenced by ability to teach back using printed resource and VC by DC ( 3 vsists)   Baseline Dependent   Time 3   Period Months   Status New   OT LONG TERM GOAL #2   Title Caregiver independent with assiting Pt to perform simple self MLD by DC ( 3 visits) to necessary for optimal LE self care.   Baseline Dependent   Time 3   Period Months   Status New   OT LONG TERM GOAL #3   Title Pt to perfrom LE self care, including LE pumping ther ex and skin care daily as instructed for 3 months to build optimal LE self care habits and routines.    Baseline Dependent   Time 3   Period Months   Status New   OT LONG TERM GOAL #4   Title Pt to tolerate custom compression garments full time daily wwithin 2 weeks  of issue date.   Baseline Dependent   Time 3   Period Months   Status New               Plan - 05/24/2015 1814    Clinical Impression Statement Pt presents with sever, stage 3, BLE lymphedema/ Elephantiasis which limits ambulation and functional mobility at home and in the community, limits ability to perform self care and most instrumental ADLs, and limits ability for family and community participation. Pt is currently utilizing a sequential pneumatic pum p daily for 1 hr on each leg as prescribed by her referring vascular MD, but as been unable to access critical custom compression garments/ devices and access to Complete Decongestive Therapy due to financial hardshhip and limited insurance benefits. 1Pt will benefit from skilled Occupational Therapy for lymphedema care to reduce swelling, limit infection risk, improve ambulation, functional mobility and B/IADL performance.  Without skilled  OT for LE care, this chronic, progressive condition is expected to worsen and Pt will likely experience further functional decline.   Pt will benefit from skilled therapeutic intervention in order to improve on the following deficits (Retired) Decreased activity tolerance;Decreased knowledge of precautions;Decreased knowledge of use of DME;Decreased mobility;Decreased skin integrity;Difficulty walking;Impaired flexibility;Obesity;Impaired perceived functional ability;Improper body mechanics;Increased edema;Other (comment);Pain   Rehab Potential Fair   Clinical Impairments Affecting Rehab Potential high infection risk   OT Frequency Monthly   OT Duration Other (comment)   OT Treatment/Interventions Self-care/ADL training;DME and/or AE instruction;Patient/family education;Therapist, nutritional;Therapeutic exercise   Plan 1.  3 visits total as allowed by Medicaid to include 1.) return visit to teach Pt and caregiver to perform simple self MLD and to reinforces education for LE precautions and LE pumping therex taught today. 2 Follow up visit after vendor fiits w/ recommended custom, [employer] "cut and sew" compression garments- capri panti ovewr BLE knee highs and custom toe caps. Vendor is researching funding and will notify me ASAP if benefits will cover.          G-Codes - 05-24-15 1719    Functional Limitation Self care   Self Care Current Status [phone]) At least 80 percent but less than 100 percent impaired, limited or restricted   Self Care Goal Status [id number]) At least 40 percent but less than 60 percent impaired, limited or restricted      Problem List Patient Active Problem List   Diagnosis Date Noted  . HYPOTHYROIDISM 11/28/2007  . OBESITY, MORBID 11/28/2007  . ASTHMA 11/28/2007  . DYSPNEA ON EXERTION 11/28/2007    Tasha Long 2015-05-24, 6:32 PM  [place] MAIN [facility] SERVICES [address] [place], [place], [address] Phone:  [phone]   Fax:  [phone]

## 2015-05-20 NOTE — Patient Instructions (Addendum)
1. Ipeccable daily skin care to limit infection risk. Bathe daily w/ soap and water and apply low pH lotion/ cream, aka Eucerine 3 x daily to improve hydration. 2. Perform lymphedema pumping there ex 2 x daily, 10 reps each exercise in sequence bilaterally. See Pt EDU 3. Continue to use pneumatic pump as directed by provider 4. Caregiver to assist w/ simple self-MLD to BLE daily as directed 5. eport any sign/ symptoms of suspected infections to MD immediately. DC MLD and compression until 72 s/p antibiotic started PRN  ex

## 2015-06-17 ENCOUNTER — Ambulatory Visit: Payer: Medicaid Other | Admitting: Occupational Therapy

## 2015-10-13 ENCOUNTER — Encounter: Payer: Medicaid Other | Attending: Surgery | Admitting: Surgery

## 2015-10-13 DIAGNOSIS — F17200 Nicotine dependence, unspecified, uncomplicated: Secondary | ICD-10-CM | POA: Insufficient documentation

## 2015-10-13 DIAGNOSIS — K219 Gastro-esophageal reflux disease without esophagitis: Secondary | ICD-10-CM | POA: Insufficient documentation

## 2015-10-13 DIAGNOSIS — F419 Anxiety disorder, unspecified: Secondary | ICD-10-CM | POA: Insufficient documentation

## 2015-10-13 DIAGNOSIS — I10 Essential (primary) hypertension: Secondary | ICD-10-CM | POA: Diagnosis not present

## 2015-10-13 DIAGNOSIS — I2510 Atherosclerotic heart disease of native coronary artery without angina pectoris: Secondary | ICD-10-CM | POA: Diagnosis not present

## 2015-10-13 DIAGNOSIS — L97211 Non-pressure chronic ulcer of right calf limited to breakdown of skin: Secondary | ICD-10-CM | POA: Insufficient documentation

## 2015-10-13 DIAGNOSIS — L97221 Non-pressure chronic ulcer of left calf limited to breakdown of skin: Secondary | ICD-10-CM | POA: Diagnosis not present

## 2015-10-13 DIAGNOSIS — M1990 Unspecified osteoarthritis, unspecified site: Secondary | ICD-10-CM | POA: Diagnosis not present

## 2015-10-13 DIAGNOSIS — G4730 Sleep apnea, unspecified: Secondary | ICD-10-CM | POA: Diagnosis not present

## 2015-10-13 DIAGNOSIS — Z993 Dependence on wheelchair: Secondary | ICD-10-CM | POA: Diagnosis not present

## 2015-10-13 DIAGNOSIS — J449 Chronic obstructive pulmonary disease, unspecified: Secondary | ICD-10-CM | POA: Insufficient documentation

## 2015-10-13 DIAGNOSIS — J45909 Unspecified asthma, uncomplicated: Secondary | ICD-10-CM | POA: Diagnosis not present

## 2015-10-13 DIAGNOSIS — I890 Lymphedema, not elsewhere classified: Secondary | ICD-10-CM | POA: Insufficient documentation

## 2015-10-13 DIAGNOSIS — Z880 Allergy status to penicillin: Secondary | ICD-10-CM | POA: Insufficient documentation

## 2015-10-14 NOTE — Progress Notes (Addendum)
Tasha Long, Tasha Long ([id number]) Visit Report for 10/13/2015 Chief Complaint Document Details Tasha Long, Tasha Long 10/13/2015 10:15 Patient Name: Date of Service: Tasha Long Medical Record Patient Account Number: [id number] Number: Treating RN: Montey Hora Jan 19, 1956 (58 y.o. Other Clinician: Date of Birth/Sex: Female) Treating Eastin Swing Primary Care Physician: [name] Physician/Extender: Referring Physician: Suella Grove in Treatment: 0 Information Obtained from: Patient Chief Complaint She has bilateral lower extremities swelling has been there for several years. she was seen here at the wound center between January and March 2016. She has been very noncompliant about her treatment and now has recurrent problems. She is morbidly obese and has gradually developed bilateral lower extremity massive lymphedema. She is wheelchair bound. Her caregiver is concerned regarding some blisters on both lower legs. Electronic Signature(s) Signed: 10/13/2015 11:18:38 Long By: Christin Fudge MD, FACS Entered By: Christin Fudge on 10/13/2015 11:18:37 Mccombs, Sakai D. ([id number]) -------------------------------------------------------------------------------- HPI Details Tasha Long, Tasha Long 10/13/2015 10:15 Patient Name: Date of Service: Tasha Long Medical Record Patient Account Number: [id number] Number: Treating RN: Montey Hora 1956-03-04 (58 y.o. Other Clinician: Date of Birth/Sex: Female) Treating Jhayla Podgorski Primary Care Physician: [name] Physician/Extender: Referring Physician: Suella Grove in Treatment: 0 History of Present Illness Location: bilateral lower extremities Quality: the patient does have intermittent throbbing pain in her lower extremities Severity: the patient has severe bilateral lower extremity lymphedema Duration: she's had these problems for many years Modifying Factors: the patient is morbidly obese Associated Signs and Symptoms: she  denies any fevers. She does have associated open wounds on her lower extremities HPI Description: The patient is a 60 year old obese female with bilateral lower extremity lymphedema. She also has associated open wounds over the anterior aspects of both lower legs. The last time she was seen here in the wound clinic was in March 2016 and she was supposed to get lymphedema pumps.The lymphedema pumps are not with her yet but the paperwork has started. She has much less weeping and pain. he has no fever or any other issues. she has also been seen by her PCP and been put on a couple of antibiotics for what seems like upper respiratory tract infection. She also may be having an element of CHF. Electronic Signature(s) Signed: 10/13/2015 11:20:29 Long By: Christin Fudge MD, FACS Entered By: Christin Fudge on 10/13/2015 11:20:29 ESTELA, BECKENDORF ([id number]) -------------------------------------------------------------------------------- Physical Exam Details Tasha Long, Tasha Long 10/13/2015 10:15 Patient Name: Date of Service: Tasha Long Medical Record Patient Account Number: [id number] Number: Treating RN: Montey Hora 1956-09-12 (58 y.o. Other Clinician: Date of Birth/Sex: Female) Treating Janai Brannigan Primary Care Physician: [name] Physician/Extender: Referring Physician: Suella Grove in Treatment: 0 Constitutional . Pulse regular. Respirations normal and unlabored. Afebrile. . Eyes Nonicteric. Reactive to light. Ears, Nose, Mouth, and Throat Lips, teeth, and gums WNL.Marland Kitchen Moist mucosa without lesions . Neck supple and nontender. No palpable supraclavicular or cervical adenopathy. Normal sized without goiter. Respiratory WNL. No retractions.. Breath sounds WNL, No rubs, rales, rhonchi, or wheeze.. Cardiovascular . Pedal Pulses WNL. ABI could not be measured due to her massive lymphedema.. she has got massive stage III lymphedema. Lymphatic No adneopathy. No adenopathy. No  adenopathy. Musculoskeletal Adexa without tenderness or enlargement.. Digits and nails w/o clubbing, cyanosis, infection, petechiae, ischemia, or inflammatory conditions.. Integumentary (Hair, Skin) No suspicious lesions. No crepitus or fluctuance. No peri-wound warmth or erythema. No masses.Marland Kitchen Psychiatric Judgement and insight Intact.. No evidence of depression, anxiety, or agitation.. Notes the patient has bilateral stage III lymphedema and has some micro-ulcerations on the anterior lateral  part of both lower extremities. There is no evidence of cellulitis or any other evidence of purulent drainage. Electronic Signature(s) Signed: 10/13/2015 11:30:25 Long By: Christin Fudge MD, FACS Entered By: Christin Fudge on 10/13/2015 11:30:23 Tasha Long, Tasha Long ([id number]) -------------------------------------------------------------------------------- Physician Orders Details Tasha Long, Tasha Long 10/13/2015 10:15 Patient Name: Date of Service: Tasha Long Medical Record Patient Account Number: [id number] Number: Treating RN: Montey Hora Apr 05, 1956 (58 y.o. Other Clinician: Date of Birth/Sex: Female) Treating Elma Shands Primary Care Physician: [name] Physician/Extender: Referring Physician: Suella Grove in Treatment: 0 Verbal / Phone Orders: Yes Clinician: Montey Hora Read Back and Verified: Yes Diagnosis Coding ICD-10 Coding Code Description I89.0 Lymphedema, not elsewhere classified E66.01 Morbid (severe) obesity due to excess calories Wound Cleansing Wound #7 Left,Circumferential Lower Leg o Clean wound with Normal Saline. Wound #8 Right,Circumferential Lower Leg o Clean wound with Normal Saline. Anesthetic Wound #7 Left,Circumferential Lower Leg o Topical Lidocaine 4% cream applied to wound bed prior to debridement Wound #8 Right,Circumferential Lower Leg o Topical Lidocaine 4% cream applied to wound bed prior to debridement Primary Wound Dressing Wound  #7 Left,Circumferential Lower Leg o Aquacel Ag Wound #8 Right,Circumferential Lower Leg o Aquacel Ag Secondary Dressing Wound #7 Left,Circumferential Lower Leg o ABD pad Wound #8 Right,Circumferential Lower Leg o ABD pad Kennerly, Ece D. ([id number]) Dressing Change Frequency Wound #7 Left,Circumferential Lower Leg o Change dressing every week Wound #8 Right,Circumferential Lower Leg o Change dressing every week Follow-up Appointments Wound #7 Left,Circumferential Lower Leg o Return Appointment in 1 week. Wound #8 Right,Circumferential Lower Leg o Return Appointment in 1 week. Edema Control Wound #7 Left,Circumferential Lower Leg o Unna Boots Bilaterally Wound #8 Right,Circumferential Lower Leg o Unna Boots Bilaterally Additional Orders / Instructions o Other: - Please see your PCP ASAP for your lungs Electronic Signature(s) Signed: 10/13/2015 11:57:04 Long By: Christin Fudge MD, FACS Signed: 10/13/2015 5:15:29 PM By: Montey Hora Entered By: Montey Hora on 10/13/2015 11:16:53 Tasha Long, [name] ([id number]) -------------------------------------------------------------------------------- Problem List Details Tasha Long, Tasha Long 10/13/2015 10:15 Patient Name: Date of Service: Tasha Long Medical Record Patient Account Number: [id number] Number: Treating RN: Montey Hora January 13, 1956 (58 y.o. 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 ([id number]) -------------------------------------------------------------------------------- Progress Note Details Tasha Long, Tasha Long 10/13/2015 10:15 Patient Name: Date of Service: Tasha Long Medical Record Patient Account Number: [id number] Number: Treating RN: Montey Hora November 15, 1956 (58 y.o. Other Clinician: Date of Birth/Sex: Female) Treating Carmon Brigandi Primary Care Physician: [name] Physician/Extender: Referring Physician: Suella Grove in Treatment: 0 Subjective Chief Complaint Information obtained from Patient She has bilateral lower extremities swelling has been there for several years. she was seen here at the wound center between January and March 2016. She has been very noncompliant about her treatment and now has recurrent problems. She is morbidly obese and has gradually developed bilateral lower extremity massive lymphedema. She is wheelchair bound. Her caregiver is concerned regarding some blisters on both  lower legs. History of Present Illness (HPI) The following HPI elements were documented for the patient's wound: Location: bilateral lower extremities Quality: the patient does have intermittent throbbing pain in her lower extremities Severity: the patient has severe bilateral lower extremity lymphedema Duration: she's had these problems for many years Modifying Factors: the patient is morbidly obese Associated Signs and Symptoms: she denies any fevers. She does have associated open wounds on her lower extremities The patient is a 59 year old obese female with bilateral lower extremity lymphedema. She also has associated open wounds over the anterior aspects of both lower legs. The  last time she was seen here in the wound clinic was in March 2016 and she was supposed to get lymphedema pumps.The lymphedema pumps are not with her yet but the paperwork has started. She has much less weeping and pain. he has no fever or any other issues. she has also been seen by her PCP and been put on a couple of antibiotics for what seems like upper respiratory tract infection. She also may be having an element of CHF. Wound History Patient presents with 2 open wounds that have been present for approximately 2-3 weeks. Patient has been treating wounds in the following manner: open to air, doxycycine, clindamycin. Laboratory tests have not been performed in the last month. Patient reportedly has not tested positive for an antibiotic resistant organism. Patient reportedly has not tested positive for osteomyelitis. Patient reportedly has not had testing performed to evaluate circulation in the legs. Patient experiences the following problems associated with their wounds: infection, swelling. Tasha Long, Tasha Long ([id number]) Patient History Information obtained from Patient. Allergies amoxicillin, Zithromax Z-Pak Family History Cancer - Father, Hypertension - Mother, Thyroid Problems - Mother, Child, No family history of Diabetes, Heart Disease, Hereditary Spherocytosis, Kidney Disease, Lung Disease, Seizures, Stroke, Tuberculosis. Social History Current every day smoker, Marital Status - Separated, Alcohol Use - Never, Drug Use - No History, Caffeine Use - Daily - mountain dew. Medical And Surgical History Notes Ear/Nose/Mouth/Throat hard of hearing Gastrointestinal gerd Medications inhaler inhalation unspecified inhalation nebulizer inhalation solution inhalation oxygen nasal gas nasal synthroid oral 1 1 tablet oral Spiriva with HandiHaler 18 mcg and inhalation capsules inhalation 1 1 capsule, w/inhalation device inhalation lisinopril 20 mg tablet oral 1 1 tablet  oral Symbicort 160 mcg-4.5 mcg/actuation HFA aerosol inhaler inhalation HFA aerosol inhaler inhalation Objective Constitutional Pulse regular. Respirations normal and unlabored. Afebrile. Vitals Time Taken: 10:29 Long, Height: 62 in, Source: Stated, Weight: 375 lbs, Source: Stated, BMI: 68.6, Temperature: 97.5 F, Pulse: 75 bpm, Respiratory Rate: 26 breaths/min, Blood Pressure: 134/59 mmHg. Tasha Long, [name] ([id number]) Eyes Nonicteric. Reactive to light. Ears, Nose, Mouth, and Throat Lips, teeth, and gums WNL.Marland Kitchen Moist mucosa without lesions . Neck supple and nontender. No palpable supraclavicular or cervical adenopathy. Normal sized without goiter. Respiratory WNL. No retractions.. Breath sounds WNL, No rubs, rales, rhonchi, or wheeze.. Cardiovascular Pedal Pulses WNL. ABI could not be measured due to her massive lymphedema.. she has got massive stage III lymphedema. Lymphatic No adneopathy. No adenopathy. No adenopathy. Musculoskeletal Adexa without tenderness or enlargement.. Digits and nails w/o clubbing, cyanosis, infection, petechiae, ischemia, or inflammatory conditions.Marland Kitchen Psychiatric Judgement and insight Intact.. No evidence of depression, anxiety, or agitation.. General Notes: the patient has bilateral stage III lymphedema and has some micro-ulcerations on the anterior lateral part of both lower extremities. There is no evidence of cellulitis or any other evidence of purulent drainage. Integumentary (Hair, Skin) No suspicious lesions. No crepitus or fluctuance. No peri-wound  warmth or erythema. No masses.. Wound #7 status is Open. Original cause of wound was Gradually Appeared. The wound is located on the Left,Circumferential Lower Leg. The wound measures 10cm length x 27cm width x 0.1cm depth; 212.058cm^2 area and 21.206cm^3 volume. The wound is limited to skin breakdown. There is no tunneling or undermining noted. There is a large amount of serous drainage noted. The  wound margin is flat and intact. There is large (67-100%) pink granulation within the wound bed. There is no necrotic tissue within the wound bed. The periwound skin appearance did not exhibit: Callus, Crepitus, Excoriation, Fluctuance, Friable, Induration, Localized Edema, Rash, Scarring, Dry/Scaly, Maceration, Moist, Atrophie Blanche, Cyanosis, Ecchymosis, Hemosiderin Staining, Mottled, Pallor, Rubor, Erythema. Periwound temperature was noted as No Abnormality. The periwound has tenderness on palpation. Wound #8 status is Open. Original cause of wound was Gradually Appeared. The wound is located on the Right,Circumferential Lower Leg. The wound measures 11cm length x 44cm width x 0.1cm depth; 380.133cm^2 area and 38.013cm^3 volume. The wound is limited to skin breakdown. There is no tunneling or undermining noted. There is a large amount of serous drainage noted. The wound margin is flat and intact. There is large (67-100%) pink granulation within the wound bed. There is no necrotic tissue within the wound bed. The periwound skin appearance did not exhibit: Callus, Crepitus, Excoriation, Fluctuance, Goldwasser, Tais D. ([id number]) Friable, Induration, Localized Edema, Rash, Scarring, Dry/Scaly, Maceration, Moist, Atrophie Blanche, Cyanosis, Ecchymosis, Hemosiderin Staining, Mottled, Pallor, Rubor, Erythema. Periwound temperature was noted as No Abnormality. The periwound has tenderness on palpation. 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 She is recently been on antibiotics for upper respiratory infection and has taken  both clindamycin and doxycycline recently and may still be on it. It is extremely hard to communicate with the patient and hopefully we will have her daughter at the bedside next week. I understand her lymphedema pumps have not yet arrived for some reason but they're actively in the process of getting them. I have recommended silver alginate and Unna's boots to both lower extremities. We will see her back next week. Plan Wound Cleansing: Wound #7 Left,Circumferential Lower Leg: Clean wound with Normal Saline. Wound #8 Right,Circumferential Lower Leg: Clean wound with Normal Saline. Anesthetic: Wound #7 Left,Circumferential Lower Leg: Topical Lidocaine 4% cream applied to wound bed prior to debridement Wound #8 Right,Circumferential Lower Leg: Tasha Long, DETORRES. ([id number]) Topical Lidocaine 4% cream applied to wound bed prior to debridement Primary Wound Dressing: Wound #7 Left,Circumferential Lower Leg: Aquacel Ag Wound #8 Right,Circumferential Lower Leg: Aquacel Ag Secondary Dressing: Wound #7 Left,Circumferential Lower Leg: ABD pad Wound #8 Right,Circumferential Lower Leg: ABD pad Dressing Change Frequency: Wound #7 Left,Circumferential Lower Leg: Change dressing every week Wound #8 Right,Circumferential Lower Leg: Change dressing every week Follow-up Appointments: Wound #7 Left,Circumferential Lower Leg: Return Appointment in 1 week. Wound #8 Right,Circumferential Lower Leg: Return Appointment in 1 week. 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 She is recently been on antibiotics for upper respiratory infection and has taken both clindamycin and doxycycline  recently and may still be on it. It is extremely hard to communicate with the patient and hopefully we will have her daughter at the bedside next week. I understand her lymphedema pumps have not yet arrived for some reason but they're actively in the process of getting them. I have recommended silver alginate and Unna's boots to both lower extremities. We will see her back next week. Tasha Long, LIPS ([id number]) Electronic Signature(s) Signed: 10/15/2015 3:38:57 PM By: Christin Fudge MD, FACS Previous Signature: 10/13/2015 11:32:38 Long Version By: Christin Fudge MD, FACS Entered By: Christin Fudge on 10/15/2015 15:38:57 Reddin, Tacori DMarland Kitchen ([id number]) -------------------------------------------------------------------------------- ROS/PFSH Details Topp, Savi 10/13/2015 10:15 Patient Name: Date of Service: Tasha Long Medical Record Patient Account Number: [id number] Number: Treating RN: Montey Hora Aug 07, 1956 (58 y.o. Other Clinician: Date of Birth/Sex: Female) Treating Oz Gammel Primary Care Physician: [name] Physician/Extender: Referring Physician: Suella Grove in Treatment: 0 Information Obtained From Patient Wound History Do you currently have one or more open woundso Yes How many open wounds do you currently haveo 2 Approximately how long have you had your woundso 2-3 weeks How have you been treating your wound(s) until nowo open to air, doxycycine, clindamycin Has your wound(s) ever healed and then re-openedo No Have you had any lab work done in the past montho No Have you tested positive for an antibiotic resistant organism No (MRSA, VRE)o Have you tested positive for osteomyelitis (bone infection)o No Have you had any tests for circulation on your legso No Have you had other problems associated with your woundso Infection, Swelling Eyes Medical History: Negative for: Cataracts; Glaucoma; Optic Neuritis Ear/Nose/Mouth/Throat Medical  History: Positive for: Middle ear problems Past Medical History Notes: hard of hearing Hematologic/Lymphatic Medical History: Positive for: Lymphedema Respiratory Medical History: Positive for: Asthma; Chronic Obstructive Pulmonary Disease (COPD); Sleep Apnea Cardiovascular ALLIRA, BUXBAUM ([id number]) Medical History: Positive for: Coronary Artery Disease; Hypertension Gastrointestinal Medical History: Past Medical History Notes: gerd Endocrine Medical History: Negative for: Type I Diabetes; Type II Diabetes Immunological Medical History: Negative for: Lupus Erythematosus; Raynaudos; Scleroderma Integumentary (Skin) Medical History: Negative for: History of Burn; History of pressure wounds Musculoskeletal Medical History: Positive for: Osteoarthritis Neurologic Medical History: Negative for: Dementia; Neuropathy; Quadriplegia; Paraplegia; Seizure Disorder Oncologic Medical History: Negative for: Received Chemotherapy; Received Radiation Psychiatric Medical History: Positive for: Confinement Anxiety HBO Extended History Items Ear/Nose/Mouth/Throat: Middle ear problems Family and Social History Cancer: Yes - Father; Diabetes: No; Heart Disease: No; Hereditary Spherocytosis: No; Hypertension: Yes - Mother; Kidney Disease: No; Lung Disease: No; Seizures: No; Stroke: No; Thyroid Problems: Yes - Mother, Child; Tuberculosis: No; Current every day smoker; Marital Status - Separated; Alcohol Use: ZACORIA, REZNICEK D. ([id number]) Never; Drug Use: No History; Caffeine Use: Daily - mountain dew; Financial Concerns: No; Food, Clothing or Shelter Needs: No; Support System Lacking: No; Transportation Concerns: No; Advanced Directives: No; Patient does not want information on Advanced Directives; Living Will: No Physician Affirmation I have reviewed and agree with the above information. Electronic Signature(s) Signed: 10/13/2015 11:02:25 Long By: Christin Fudge MD,  FACS Signed: 10/13/2015 5:15:29 PM By: Montey Hora Entered By: Christin Fudge on 10/13/2015 11:02:24 Setters, Georgeanna Lea ([id number]) -------------------------------------------------------------------------------- SuperBill Details Patient Name: Alric Ran D. Date of Service: 10/13/2015 Medical Record Number: [id number] Patient Account Number: [id number] Date of Birth/Sex: [birth year] (59 y.o. 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 Physician Procedures CPT4 Code Description: [id number] - WC PHYS LEVEL 4 - EST PT ICD-10 Description Diagnosis I89.0 Lymphedema, not elsewhere classified E66.01 Morbid (severe) obesity due to excess calories L97.211 Non-pressure chronic ulcer of right calf limited to  L97.221 Non-pressure chronic ulcer of left calf limited to Modifier: breakdown of s breakdown of sk Quantity: 1 kin in Electronic Signature(s) Signed: 10/13/2015 11:33:08 Long By: Christin Fudge MD, FACS Entered By: Christin Fudge on 10/13/2015 11:33:07

## 2015-10-14 NOTE — Progress Notes (Signed)
Tasha Long, Tasha Long ([id number]) Visit Report for 10/13/2015 Abuse/Suicide Risk Screen Details Tasha Long, Tasha Long 10/13/2015 10:15 Patient Name: Date of Service: Tasha Long Medical Record Patient Account Number: [id number] Number: Treating RN: Montey Hora 1956-04-02 (58 y.o. Other Clinician: Date of Birth/Sex: Female) Treating Britto, Errol Primary Care Physician: [name] Physician/Extender: Referring Physician: Suella Grove in Treatment: 0 Abuse/Suicide Risk Screen Items Answer ABUSE/SUICIDE RISK SCREEN: Has anyone close to you tried to hurt or harm you recentlyo No Do you feel uncomfortable with anyone in your familyo No Has anyone forced you do things that you didnot want to doo No Do you have any thoughts of harming yourselfo No Patient displays signs or symptoms of abuse and/or neglect. No Electronic Signature(s) Signed: 10/13/2015 5:15:29 PM By: Montey Hora Entered By: Montey Hora on 10/13/2015 10:35:33 Tasha Long, Tasha Long Kitchen ([id number]) -------------------------------------------------------------------------------- Activities of Daily Living Details Tasha Long, Tasha Long 10/13/2015 10:15 Patient Name: Date of Service: Tasha Long Medical Record Patient Account Number: [id number] Number: Treating RN: Montey Hora September 02, 1956 (58 y.o. Other Clinician: Date of Birth/Sex: Female) Treating Britto, Errol Primary Care Physician: [name] Physician/Extender: Referring Physician: Suella Grove in Treatment: 0 Activities of Daily Living Items Answer Activities of Daily Living (Please select one for each item) Drive Automobile Not Able Take Medications Completely Able Use Telephone Completely Able Care for Appearance Completely Able Use Toilet Completely Able Bath / Shower Need Assistance Dress Self Need Assistance Feed Self Completely Able Walk Need Assistance Get In / Out Bed Completely [place] for Self Need Assistance Electronic Signature(s) Signed: 10/13/2015 5:15:29 PM By: Montey Hora Entered By: Montey Hora on 10/13/2015 10:36:16 Tasha Long ([id number]) -------------------------------------------------------------------------------- Education Assessment Details Tasha Long, Tasha Long 10/13/2015 10:15 Patient Name: Date of Service: Tasha Long Medical Record Patient Account Number: [id number] Number: Treating RN: Montey Hora 10/15/1956 (58 y.o. Other Clinician: Date of Birth/Sex: Female) Treating Britto, Errol Primary Care Physician: [name] Physician/Extender: Referring Physician: Suella Grove in Treatment: 0 Primary Learner Assessed: Patient Learning Preferences/Education Level/Primary Language Learning Preference: Explanation, Demonstration Highest Education Level: High School Preferred Language: English Cognitive Barrier Assessment/Beliefs Language Barrier: No Translator Needed: No Memory Deficit: No Emotional Barrier: No Cultural/Religious Beliefs Affecting Medical No Care: Physical Barrier Assessment Impaired Vision: No Impaired Hearing: Yes HOH Decreased Hand dexterity: No Knowledge/Comprehension Assessment Knowledge Level: Medium Comprehension Level: Medium Ability to understand written Medium instructions: Ability to understand verbal Medium instructions: Motivation Assessment Anxiety Level: Calm Cooperation: Cooperative Education Importance: Acknowledges Need Interest in Health Problems: Asks Questions Perception: Coherent Willingness to Engage in Self- Medium Management Activities: Tasha Long, Tasha Long ([id number]) Readiness to Engage in Self- Management Activities: Electronic Signature(s) Signed: 10/13/2015 5:15:29 PM By: Montey Hora Entered By: Montey Hora on 10/13/2015 10:36:46 Tasha Long, Tasha [name]  ([id number]) -------------------------------------------------------------------------------- Fall Risk Assessment Details Tasha Long, Tasha Long 10/13/2015 10:15 Patient Name: Date of Service: Tasha Long Medical Record Patient Account Number: [id number] Number: Treating RN: Montey Hora 10/05/1956 (58 y.o. Other Clinician: Date of Birth/Sex: Female) Treating Britto, Errol Primary Care Physician: [name] Physician/Extender: Referring Physician: Suella Grove in Treatment: 0 Fall Risk Assessment Items FALL RISK ASSESSMENT: History of falling - immediate or within 3 months 0 No Secondary diagnosis 0 No Ambulatory aid None/bed rest/wheelchair/nurse 0 No Crutches/cane/walker 15 Yes Furniture 0 No IV Access/Saline Lock 0 No Gait/Training Normal/bed rest/immobile 0 No Weak 10 Yes Impaired 0 No Mental Status Oriented to own ability 0 Yes Electronic Signature(s) Signed: 10/13/2015 5:15:29 PM By: Montey Hora Entered By: Tasha Long on 10/13/2015 10:37:14 Tasha Long, Tasha [name] ([id number]) -------------------------------------------------------------------------------- Foot Assessment Details Tasha Long, Tasha Long 10/13/2015 10:15 Patient Name: Date of Service: Tasha Long Medical Record Patient Account Number: [id number] Number: Treating RN: Montey Hora 1956-04-18 (58 y.o. Other Clinician: Date of Birth/Sex: Female) Treating Britto, Errol Primary Care Physician: [name] Physician/Extender: Referring Physician: Suella Grove in Treatment: 0 Foot Assessment Items Site Locations + = Sensation present, - = Sensation absent, C = Callus, U = Ulcer R = Redness, W = Warmth, M = Maceration, PU = Pre-ulcerative lesion F = Fissure, S = Swelling, Long = Dryness Assessment Right: Left: Other Deformity: No No Prior Foot Ulcer: No No Prior Amputation: No No Charcot Joint: No No Ambulatory Status: Ambulatory With Help Assistance Device: Wheelchair Gait: Steady Electronic  Signature(s) Signed: 10/13/2015 5:15:29 PM By: Tasha Long [name] ([id number]) Entered By: Montey Hora on 10/13/2015 10:37:45 Tasha Long, Tasha Long [name] ([id number]) -------------------------------------------------------------------------------- Nutrition Risk Assessment Details Tasha Long, Tasha Long 10/13/2015 10:15 Patient Name: Date of Service: Tasha Long Medical Record Patient Account Number: [id number] Number: Treating RN: Montey Hora 03-Nov-1956 (58 y.o. Other Clinician: Date of Birth/Sex: Female) Treating Britto, Errol Primary Care Physician: [name] Physician/Extender: Referring Physician: Suella Grove in Treatment: 0 Height (in): 62 Weight (lbs): 375 Body Mass Index (BMI): 68.6 Nutrition Risk Assessment Items NUTRITION RISK SCREEN: I have an illness or condition that made me change the kind and/or 0 No amount of food I eat I eat fewer than two meals per day 0 No I eat few fruits and vegetables, or milk products 0 No I have three or more drinks of beer, liquor or wine almost every day 0 No I have tooth or mouth problems that make it hard for me to eat 0 No I don't always have enough money to buy the food I need 0 No I eat alone most of the time 0 No I take three or more different prescribed or over-the-counter drugs a 1 Yes day Without wanting to, I have lost or gained 10 pounds in the last six 0 No months I Long not always physically able to shop, cook and/or feed myself 0 No Nutrition Protocols Good Risk Protocol 0 No interventions needed Moderate Risk Protocol Electronic Signature(s) Signed: 10/13/2015 5:15:29 PM By: Montey Hora Entered By: Montey Hora on 10/13/2015 10:37:22

## 2015-10-14 NOTE — Progress Notes (Addendum)
Tasha Long, Tasha Long ([id number]) Visit Report for 10/13/2015 Allergy List Details Patient Name: Tasha Long, Tasha [name] Date of Service: 10/13/2015 10:15 AM Medical Record Number: [id number] Patient Account Number: [id number] Date of Birth/Sex: May 16, 1956 (59 y.o. Female) Treating RN: Montey Hora Primary Care Physician: [name] Other Clinician: Referring Physician: Treating Physician/Extender: Frann Rider in Treatment: 0 Allergies Active Allergies amoxicillin Zithromax Z-Pak Allergy Notes Electronic Signature(s) Signed: 10/13/2015 5:15:29 PM By: Montey Hora Entered By: Montey Hora on 10/13/2015 10:33:25 Hockett, Tasha Long ([id number]) -------------------------------------------------------------------------------- Arrival Information Details Patient Name: Tasha Ran D. Date of Service: 10/13/2015 10:15 AM Medical Record Number: [id number] Patient Account Number: [id number] Date of Birth/Sex: October 05, 1956 (59 y.o. Female) Treating RN: Montey Hora Primary Care Physician: [name] Other Clinician: Referring Physician: Treating Physician/Extender: Frann Rider in Treatment: 0 Visit Information Patient Arrived: Wheel Chair Arrival Time: 10:27 Accompanied By: self Transfer Assistance: None Patient Identification Verified: Yes Secondary Verification Process Yes Completed: History Since Last Visit Added or deleted any medications: No Any new allergies or adverse reactions: No Had a fall or experienced change in activities of daily living that may affect risk of falls: No Signs or symptoms of abuse/neglect since last visito No Hospitalized since last visit: No Electronic Signature(s) Signed: 10/13/2015 5:15:29 PM By: Montey Hora Entered By: Montey Hora on 10/13/2015 10:28:20 [name], [name] ([id number]) -------------------------------------------------------------------------------- Clinic Level of Care Assessment  Details Patient Name: Tasha Ran D. Date of Service: 10/13/2015 10:15 AM Medical Record Number: [id number] Patient Account Number: [id number] Date of Birth/Sex: May 25, 1956 (59 y.o. Female) Treating RN: Montey Hora Primary Care Physician: [name] Other Clinician: Referring Physician: Treating Physician/Extender: Frann Rider in Treatment: 0 Clinic Level of Care Assessment Items TOOL 1 Quantity Score []  - Use when EandM and Procedure is performed on INITIAL visit 0 ASSESSMENTS - Nursing Assessment / Reassessment X - General Physical Exam (combine w/ comprehensive assessment (listed just 1 20 below) when performed on new pt. evals) X - Comprehensive Assessment (HX, ROS, Risk Assessments, Wounds Hx, etc.) 1 25 ASSESSMENTS - Wound and Skin Assessment / Reassessment []  - Dermatologic / Skin Assessment (not related to wound area) 0 ASSESSMENTS - Ostomy and/or Continence Assessment and Care []  - Incontinence Assessment and Management 0 []  - Ostomy Care Assessment and Management (repouching, etc.) 0 PROCESS - Coordination of Care X - Simple Patient / Family Education for ongoing care 1 15 []  - Complex (extensive) Patient / Family Education for ongoing care 0 X - Staff obtains Programmer, systems, Records, Test Results / Process Orders 1 10 []  - Staff telephones HHA, Nursing Homes / Clarify orders / etc 0 []  - Routine Transfer to another Facility (non-emergent condition) 0 []  - Routine Hospital Admission (non-emergent condition) 0 X - New Admissions / Biomedical engineer / Ordering NPWT, Apligraf, etc. 1 15 []  - Emergency Hospital Admission (emergent condition) 0 PROCESS - Special Needs []  - Pediatric / Minor Patient Management 0 []  - Isolation Patient Management 0 [name], [name] ([id number]) []  - Hearing / Language / Visual special needs 0 []  - Assessment of Community assistance (transportation, D/C planning, etc.) 0 []  - Additional assistance / Altered mentation  0 []  - Support Surface(s) Assessment (bed, cushion, seat, etc.) 0 INTERVENTIONS - Miscellaneous []  - External ear exam 0 []  - Patient Transfer (multiple staff / Civil Service fast streamer / Similar devices) 0 []  - Simple Staple / Suture removal (25 or less) 0 []  - Complex Staple / Suture removal (26 or more) 0 []  - Hypo/Hyperglycemic Management (do not check  if billed separately) 0 []  - Ankle / Brachial Index (ABI) - do not check if billed separately 0 Has the patient been seen at the hospital within the last three years: Yes Total Score: 85 Level Of Care: New/Established - Level 3 Electronic Signature(s) Signed: 10/13/2015 5:15:29 PM By: Montey Hora Entered By: Montey Hora on 10/13/2015 11:17:18 [name], [name] ([id number]) -------------------------------------------------------------------------------- Encounter Discharge Information Details Patient Name: Tasha Ran D. Date of Service: 10/13/2015 10:15 AM Medical Record Number: [id number] Patient Account Number: [id number] Date of Birth/Sex: 02-10-1956 (59 y.o. Female) Treating RN: Montey Hora Primary Care Physician: [name] Other Clinician: Referring Physician: Treating Physician/Extender: Frann Rider in Treatment: 0 Encounter Discharge Information Items Discharge Pain Level: 0 Discharge Condition: Stable Ambulatory Status: Wheelchair Discharge Destination: Home Transportation: Private Auto Accompanied By: brother Schedule Follow-up Appointment: Yes Medication Reconciliation completed and provided to Patient/Care No Daivion Pape: Provided on Clinical Summary of Care: 10/13/2015 Form Type Recipient Paper Patient [facility] Electronic Signature(s) Signed: 10/14/2015 11:38:42 AM By: Ruthine Dose Previous Signature: 10/13/2015 11:57:55 AM Version By: Montey Hora Previous Signature: 10/13/2015 11:44:18 AM Version By: Ruthine Dose Entered By: Ruthine Dose on 10/14/2015 11:38:42 [name], [name]  ([id number]) -------------------------------------------------------------------------------- Lower Extremity Assessment Details Patient Name: Tasha Ran D. Date of Service: 10/13/2015 10:15 AM Medical Record Number: [id number] Patient Account Number: [id number] Date of Birth/Sex: [birth year] (59 y.o. Female) Treating RN: Montey Hora Primary Care Physician: [name] Other Clinician: Referring Physician: Treating Physician/Extender: Frann Rider in Treatment: 0 Edema Assessment Assessed: [Left: No] [Right: No] Edema: [Left: Yes] [Right: Yes] Calf Left: Right: Point of Measurement: 35 cm From Medial Instep 76.3 cm 82 cm Ankle Left: Right: Point of Measurement: 6 cm From Medial Instep 32 cm 33.5 cm Vascular Assessment Pulses: Posterior Tibial Palpable: [Left:Yes] [Right:Yes] Doppler: [Left:Monophasic] [Right:Monophasic] Dorsalis Pedis Palpable: [Left:Yes] [Right:Yes] Doppler: [Left:Monophasic] [Right:Monophasic] Extremity colors, hair growth, and conditions: Extremity Color: [Left:Red] [Right:Red] Hair Growth on Extremity: [Left:Yes] [Right:Yes] Temperature of Extremity: [Left:Warm] [Right:Warm] Capillary Refill: [Left:< 3 seconds] [Right:< 3 seconds] Toe Nail Assessment Left: Right: Thick: No No Discolored: No No Deformed: No No Improper Length and Hygiene: No No Notes no ABI R/T leg size and lymphedema VIKKIE, Tasha Long ([id number]) Electronic Signature(s) Signed: 10/13/2015 5:15:29 PM By: Montey Hora Entered By: Montey Hora on 10/13/2015 10:58:09 Milanes, Gena D. ([id number]) -------------------------------------------------------------------------------- Multi Wound Chart Details Patient Name: Tasha Ran D. Date of Service: 10/13/2015 10:15 AM Medical Record Number: [id number] Patient Account Number: [id number] Date of Birth/Sex: [birth year] (58 y.o. Female) Treating RN: Montey Hora Primary Care Physician: [name] Other Clinician: Referring Physician: Treating Physician/Extender: Frann Rider in Treatment: 0 Vital Signs Height(in): 62 Pulse(bpm): 75 Weight(lbs): 375 Blood Pressure 134/59 (mmHg): Body Mass Index(BMI): 69 Temperature(F): 97.5 Respiratory Rate 26 (breaths/min): Photos: [7:No Photos] [8:No Photos] [N/A:N/A] Wound Location: [7:Left, Circumferential Lower Leg] [8:Right, Circumferential Lower Leg] [N/A:N/A] Wounding Event: [7:Gradually Appeared] [8:Gradually Appeared] [N/A:N/A] Primary Etiology: [7:Lymphedema] [8:Lymphedema] [N/A:N/A] Comorbid History: [7:Middle ear problems, Lymphedema, Asthma, Chronic Obstructive Pulmonary Disease (COPD), Sleep Apnea, Coronary Artery Disease, Hypertension, Osteoarthritis, Confinement Anxiety] [8:Middle ear problems, Lymphedema, Asthma, Chronic  Obstructive Pulmonary Disease (COPD), Sleep Apnea, Coronary Artery Disease, Hypertension, Osteoarthritis, Confinement Anxiety] [N/A:N/A] Date Acquired: [7:09/28/2015] [8:09/28/2015] [N/A:N/A] Weeks of Treatment: [7:0] [8:0] [N/A:N/A] Wound Status: [7:Open] [8:Open] [N/A:N/A] Clustered Wound: [7:Yes] [8:Yes] [N/A:N/A] Measurements L x W x D 10x27x0.1 [8:11x44x0.1] [N/A:N/A] (cm) Area (cm) : [7:212.058] [id number] [N/A:N/A] Volume (cm) : [7:21.206] [8:38.013] [N/A:N/A] Classification: [7:Partial Thickness] [8:Partial Thickness] [N/A:N/A] Exudate Amount: [7:Large] [8:Large] [N/A:N/A] Exudate Type: [7:Serous] [  8:Serous] [N/A:N/A] Exudate Color: [7:amber] [8:amber] [N/A:N/A] Wound Margin: [7:Flat and Intact] [8:Flat and Intact] [N/A:N/A] Granulation Amount: [7:Large (67-100%)] [8:Large (67-100%)] [N/A:N/A] Granulation Quality: [7:Pink] [8:Pink] [N/A:N/A] Necrotic Amount: [7:None Present (0%)] [8:None Present (0%)] [N/A:N/A] Exposed Structures: Fascia: No Fascia: No N/A Fat: No Fat: No Tendon: No Tendon: No Muscle: No Muscle: No Joint: No Joint: No Bone: No Bone: No Limited  to Skin Limited to Skin Breakdown Breakdown Epithelialization: None None N/A Periwound Skin Texture: Edema: No Edema: No N/A Excoriation: No Excoriation: No Induration: No Induration: No Callus: No Callus: No Crepitus: No Crepitus: No Fluctuance: No Fluctuance: No Friable: No Friable: No Rash: No Rash: No Scarring: No Scarring: No Periwound Skin Maceration: No Maceration: No N/A Moisture: Moist: No Moist: No Dry/Scaly: No Dry/Scaly: No Periwound Skin Color: Atrophie Blanche: No Atrophie Blanche: No N/A Cyanosis: No Cyanosis: No Ecchymosis: No Ecchymosis: No Erythema: No Erythema: No Hemosiderin Staining: No Hemosiderin Staining: No Mottled: No Mottled: No Pallor: No Pallor: No Rubor: No Rubor: No Temperature: No Abnormality No Abnormality N/A Tenderness on Yes Yes N/A Palpation: Wound Preparation: Ulcer Cleansing: Ulcer Cleansing: N/A Rinsed/Irrigated with Rinsed/Irrigated with Saline Saline Topical Anesthetic Topical Anesthetic Applied: Other: lidocaine Applied: Other: lidocaine 4% 4% Treatment Notes Electronic Signature(s) Signed: 10/13/2015 5:15:29 PM By: Montey Hora Entered By: Montey Hora on 10/13/2015 11:15:34 Callan, Tasha Detour. ([id number]) -------------------------------------------------------------------------------- [place] Details Patient Name: Tasha Ran D. Date of Service: 10/13/2015 10:15 AM Medical Record Number: [id number] Patient Account Number: [id number] Date of Birth/Sex: 1956/06/08 (59 y.o. Female) Treating RN: Montey Hora Primary Care Physician: [name] Other Clinician: Referring Physician: Treating Physician/Extender: Frann Rider in Treatment: 0 Active Inactive Electronic Signature(s) Signed: 10/26/2015 5:01:16 PM By: Montey Hora Signed: 11/09/2015 5:17:02 PM By: Gretta Cool RN, BSN, Kim RN, BSN Previous Signature: 10/13/2015 5:15:29 PM Version By: Montey Hora Entered  By: Gretta Cool RN, BSN, Kim on 10/22/2015 09:49:30 Lofaro, Tasha Long ([id number]) -------------------------------------------------------------------------------- Patient/Caregiver Education Details Patient Name: Tasha Ran D. Date of Service: 10/13/2015 10:15 AM Medical Record Number: [id number] Patient Account Number: [id number] Date of Birth/Gender: 02-Oct-1956 (59 y.o. Female) Treating RN: Montey Hora Primary Care Physician: [name] Other Clinician: Referring Physician: Treating Physician/Extender: Frann Rider in Treatment: 0 Education Assessment Education Provided To: Patient Education Topics Provided Venous: Controlling Swelling with Multilayered Compression Wraps, Other: do not remove compression Handouts: wraps, we will remove next visit Methods: Explain/Verbal, Printed Responses: State content correctly Electronic Signature(s) Signed: 10/13/2015 11:58:30 AM By: Montey Hora Entered By: Montey Hora on 10/13/2015 11:58:30 Kosiba, [name] ([id number]) -------------------------------------------------------------------------------- Wound Assessment Details Patient Name: Tasha Ran D. Date of Service: 10/13/2015 10:15 AM Medical Record Number: [id number] Patient Account Number: [id number] Date of Birth/Sex: [birth year] (59 y.o. Female) Treating RN: Montey Hora Primary Care Physician: [name] Other Clinician: Referring Physician: Treating Physician/Extender: Frann Rider in Treatment: 0 Wound Status Wound Number: 7 Primary Lymphedema Etiology: Wound Location: Left Lower Leg - Circumfernential Wound Open Status: Wounding Event: Gradually Appeared Comorbid Middle ear problems, Lymphedema, Date Acquired: 09/28/2015 History: Asthma, Chronic Obstructive Pulmonary Weeks Of Treatment: 0 Disease (COPD), Sleep Apnea, Clustered Wound: Yes Coronary Artery Disease, Hypertension, Osteoarthritis, Confinement  Anxiety Photos Wound Measurements Length: (cm) 10 % Reduction in Width: (cm) 27 % Reduction in Depth: (cm) 0.1 Epithelializati Area: (cm) 212.058 Tunneling: Volume: (cm) 21.206 Undermining: Area: 0% Volume: 0% on: None No No Wound Description Classification: Partial Thickness Swindell, [name] ([id number]) Foul Odor After Cleansing: No Wound Margin: Flat and Intact Exudate Amount: Large Exudate Type: Serous  Exudate Color: amber Wound Bed Granulation Amount: Large (67-100%) Exposed Structure Granulation Quality: Pink Fascia Exposed: No Necrotic Amount: None Present (0%) Fat Layer Exposed: No Tendon Exposed: No Muscle Exposed: No Joint Exposed: No Bone Exposed: No Limited to Skin Breakdown Periwound Skin Texture Texture Color No Abnormalities Noted: No No Abnormalities Noted: No Callus: No Atrophie Blanche: No Crepitus: No Cyanosis: No Excoriation: No Ecchymosis: No Fluctuance: No Erythema: No Friable: No Hemosiderin Staining: No Induration: No Mottled: No Localized Edema: No Pallor: No Rash: No Rubor: No Scarring: No Temperature / Pain Moisture Temperature: No Abnormality No Abnormalities Noted: No Tenderness on Palpation: Yes Dry / Scaly: No Maceration: No Moist: No Wound Preparation Ulcer Cleansing: Rinsed/Irrigated with Saline Topical Anesthetic Applied: Other: lidocaine 4%, Electronic Signature(s) Signed: 10/13/2015 12:18:04 PM By: Montey Hora Entered By: Montey Hora on 10/13/2015 12:18:04 Haughey, Tasha Long ([id number]) -------------------------------------------------------------------------------- Wound Assessment Details Patient Name: Tasha Ran D. Date of Service: 10/13/2015 10:15 AM Medical Record Number: [id number] Patient Account Number: [id number] Date of Birth/Sex: [birth year] (59 y.o. Female) Treating RN: Montey Hora Primary Care Physician: [name] Other Clinician: Referring Physician: Treating  Physician/Extender: Frann Rider in Treatment: 0 Wound Status Wound Number: 8 Primary Lymphedema Etiology: Wound Location: Right Lower Leg - Circumfernential Wound Open Status: Wounding Event: Gradually Appeared Comorbid Middle ear problems, Lymphedema, Date Acquired: 09/28/2015 History: Asthma, Chronic Obstructive Pulmonary Weeks Of Treatment: 0 Disease (COPD), Sleep Apnea, Clustered Wound: Yes Coronary Artery Disease, Hypertension, Osteoarthritis, Confinement Anxiety Photos Wound Measurements Length: (cm) 11 % Reduction in Width: (cm) 44 % Reduction in Depth: (cm) 0.1 Epithelializati Area: (cm) 380.133 Tunneling: Volume: (cm) 38.013 Undermining: Area: 0% Volume: 0% on: None No No Wound Description Classification: Partial Thickness Corvera, [name] ([id number]) Foul Odor After Cleansing: No Wound Margin: Flat and Intact Exudate Amount: Large Exudate Type: Serous Exudate Color: amber Wound Bed Granulation Amount: Large (67-100%) Exposed Structure Granulation Quality: Pink Fascia Exposed: No Necrotic Amount: None Present (0%) Fat Layer Exposed: No Tendon Exposed: No Muscle Exposed: No Joint Exposed: No Bone Exposed: No Limited to Skin Breakdown Periwound Skin Texture Texture Color No Abnormalities Noted: No No Abnormalities Noted: No Callus: No Atrophie Blanche: No Crepitus: No Cyanosis: No Excoriation: No Ecchymosis: No Fluctuance: No Erythema: No Friable: No Hemosiderin Staining: No Induration: No Mottled: No Localized Edema: No Pallor: No Rash: No Rubor: No Scarring: No Temperature / Pain Moisture Temperature: No Abnormality No Abnormalities Noted: No Tenderness on Palpation: Yes Dry / Scaly: No Maceration: No Moist: No Wound Preparation Ulcer Cleansing: Rinsed/Irrigated with Saline Topical Anesthetic Applied: Other: lidocaine 4%, Electronic Signature(s) Signed: 10/13/2015 12:18:44 PM By: Montey Hora Entered By:  Montey Hora on 10/13/2015 12:18:44 Pridmore, Shipman. ([id number]) -------------------------------------------------------------------------------- Vitals Details Patient Name: Tasha Ran D. Date of Service: 10/13/2015 10:15 AM Medical Record Number: [id number] Patient Account Number: [id number] Date of Birth/Sex: [birth year] (59 y.o. Female) Treating RN: Montey Hora Primary Care Physician: [name] Other Clinician: Referring Physician: Treating Physician/Extender: Frann Rider in Treatment: 0 Vital Signs Time Taken: 10:29 Temperature (F): 97.5 Height (in): 62 Pulse (bpm): 75 Source: Stated Respiratory Rate (breaths/min): 26 Weight (lbs): 375 Blood Pressure (mmHg): 134/59 Source: Stated Reference Range: 80 - 120 mg / dl Body Mass Index (BMI): 68.6 Electronic Signature(s) Signed: 10/13/2015 5:15:29 PM By: Montey Hora Entered By: Montey Hora on 10/13/2015 10:31:08

## 2015-10-20 ENCOUNTER — Ambulatory Visit: Payer: Medicaid Other | Admitting: Surgery

## 2016-03-25 ENCOUNTER — Inpatient Hospital Stay: Payer: Medicaid Other

## 2016-03-25 ENCOUNTER — Encounter: Payer: Self-pay | Admitting: Student

## 2016-03-25 ENCOUNTER — Inpatient Hospital Stay
Admission: EM | Admit: 2016-03-25 | Discharge: 2016-04-15 | DRG: 291 | Disposition: A | Discharge status: Other Acute Inpatient Hospital | Payer: Medicaid Other | Attending: Pulmonary Disease | Admitting: Pulmonary Disease

## 2016-03-25 ENCOUNTER — Emergency Department: Payer: Medicaid Other

## 2016-03-25 ENCOUNTER — Inpatient Hospital Stay
Admit: 2016-03-25 | Discharge: 2016-03-25 | Disposition: A | Discharge status: Home / Self Care | Payer: Medicaid Other | Attending: Pulmonary Disease | Admitting: Pulmonary Disease

## 2016-03-25 DIAGNOSIS — F419 Anxiety disorder, unspecified: Secondary | ICD-10-CM | POA: Diagnosis present

## 2016-03-25 DIAGNOSIS — Z1612 Extended spectrum beta lactamase (ESBL) resistance: Secondary | ICD-10-CM | POA: Diagnosis present

## 2016-03-25 DIAGNOSIS — N390 Urinary tract infection, site not specified: Secondary | ICD-10-CM | POA: Diagnosis present

## 2016-03-25 DIAGNOSIS — F439 Reaction to severe stress, unspecified: Secondary | ICD-10-CM | POA: Diagnosis present

## 2016-03-25 DIAGNOSIS — Z751 Person awaiting admission to adequate facility elsewhere: Secondary | ICD-10-CM

## 2016-03-25 DIAGNOSIS — K219 Gastro-esophageal reflux disease without esophagitis: Secondary | ICD-10-CM | POA: Diagnosis present

## 2016-03-25 DIAGNOSIS — R1310 Dysphagia, unspecified: Secondary | ICD-10-CM | POA: Diagnosis present

## 2016-03-25 DIAGNOSIS — I878 Other specified disorders of veins: Secondary | ICD-10-CM | POA: Diagnosis present

## 2016-03-25 DIAGNOSIS — R718 Other abnormality of red blood cells: Secondary | ICD-10-CM | POA: Diagnosis present

## 2016-03-25 DIAGNOSIS — K580 Irritable bowel syndrome with diarrhea: Secondary | ICD-10-CM | POA: Diagnosis present

## 2016-03-25 DIAGNOSIS — I469 Cardiac arrest, cause unspecified: Secondary | ICD-10-CM

## 2016-03-25 DIAGNOSIS — Z90710 Acquired absence of both cervix and uterus: Secondary | ICD-10-CM | POA: Diagnosis not present

## 2016-03-25 DIAGNOSIS — J9600 Acute respiratory failure, unspecified whether with hypoxia or hypercapnia: Secondary | ICD-10-CM

## 2016-03-25 DIAGNOSIS — G4733 Obstructive sleep apnea (adult) (pediatric): Secondary | ICD-10-CM | POA: Diagnosis present

## 2016-03-25 DIAGNOSIS — I5021 Acute systolic (congestive) heart failure: Principal | ICD-10-CM | POA: Diagnosis present

## 2016-03-25 DIAGNOSIS — E662 Morbid (severe) obesity with alveolar hypoventilation: Secondary | ICD-10-CM | POA: Diagnosis present

## 2016-03-25 DIAGNOSIS — K567 Ileus, unspecified: Secondary | ICD-10-CM

## 2016-03-25 DIAGNOSIS — J440 Chronic obstructive pulmonary disease with acute lower respiratory infection: Secondary | ICD-10-CM | POA: Diagnosis present

## 2016-03-25 DIAGNOSIS — Z886 Allergy status to analgesic agent status: Secondary | ICD-10-CM | POA: Diagnosis not present

## 2016-03-25 DIAGNOSIS — R401 Stupor: Secondary | ICD-10-CM | POA: Diagnosis not present

## 2016-03-25 DIAGNOSIS — R092 Respiratory arrest: Secondary | ICD-10-CM | POA: Diagnosis present

## 2016-03-25 DIAGNOSIS — F17200 Nicotine dependence, unspecified, uncomplicated: Secondary | ICD-10-CM | POA: Diagnosis present

## 2016-03-25 DIAGNOSIS — I130 Hypertensive heart and chronic kidney disease with heart failure and stage 1 through stage 4 chronic kidney disease, or unspecified chronic kidney disease: Secondary | ICD-10-CM | POA: Diagnosis present

## 2016-03-25 DIAGNOSIS — E872 Acidosis: Secondary | ICD-10-CM | POA: Diagnosis present

## 2016-03-25 DIAGNOSIS — Z882 Allergy status to sulfonamides status: Secondary | ICD-10-CM

## 2016-03-25 DIAGNOSIS — Z6841 Body Mass Index (BMI) 40.0 and over, adult: Secondary | ICD-10-CM

## 2016-03-25 DIAGNOSIS — L0390 Cellulitis, unspecified: Secondary | ICD-10-CM | POA: Diagnosis present

## 2016-03-25 DIAGNOSIS — E039 Hypothyroidism, unspecified: Secondary | ICD-10-CM | POA: Diagnosis present

## 2016-03-25 DIAGNOSIS — Z8614 Personal history of Methicillin resistant Staphylococcus aureus infection: Secondary | ICD-10-CM | POA: Diagnosis not present

## 2016-03-25 DIAGNOSIS — E1165 Type 2 diabetes mellitus with hyperglycemia: Secondary | ICD-10-CM | POA: Diagnosis present

## 2016-03-25 DIAGNOSIS — J15212 Pneumonia due to Methicillin resistant Staphylococcus aureus: Secondary | ICD-10-CM | POA: Diagnosis present

## 2016-03-25 DIAGNOSIS — E785 Hyperlipidemia, unspecified: Secondary | ICD-10-CM | POA: Diagnosis present

## 2016-03-25 DIAGNOSIS — E875 Hyperkalemia: Secondary | ICD-10-CM | POA: Diagnosis present

## 2016-03-25 DIAGNOSIS — J9622 Acute and chronic respiratory failure with hypercapnia: Secondary | ICD-10-CM | POA: Diagnosis present

## 2016-03-25 DIAGNOSIS — I429 Cardiomyopathy, unspecified: Secondary | ICD-10-CM | POA: Diagnosis present

## 2016-03-25 DIAGNOSIS — J9690 Respiratory failure, unspecified, unspecified whether with hypoxia or hypercapnia: Secondary | ICD-10-CM

## 2016-03-25 DIAGNOSIS — J189 Pneumonia, unspecified organism: Secondary | ICD-10-CM | POA: Diagnosis not present

## 2016-03-25 DIAGNOSIS — E669 Obesity, unspecified: Secondary | ICD-10-CM

## 2016-03-25 DIAGNOSIS — G931 Anoxic brain damage, not elsewhere classified: Secondary | ICD-10-CM | POA: Diagnosis present

## 2016-03-25 DIAGNOSIS — J449 Chronic obstructive pulmonary disease, unspecified: Secondary | ICD-10-CM | POA: Diagnosis not present

## 2016-03-25 DIAGNOSIS — R600 Localized edema: Secondary | ICD-10-CM

## 2016-03-25 DIAGNOSIS — F319 Bipolar disorder, unspecified: Secondary | ICD-10-CM | POA: Diagnosis present

## 2016-03-25 DIAGNOSIS — R4182 Altered mental status, unspecified: Secondary | ICD-10-CM | POA: Insufficient documentation

## 2016-03-25 DIAGNOSIS — Z452 Encounter for adjustment and management of vascular access device: Secondary | ICD-10-CM

## 2016-03-25 DIAGNOSIS — N170 Acute kidney failure with tubular necrosis: Secondary | ICD-10-CM | POA: Diagnosis present

## 2016-03-25 DIAGNOSIS — D649 Anemia, unspecified: Secondary | ICD-10-CM | POA: Diagnosis present

## 2016-03-25 DIAGNOSIS — Z992 Dependence on renal dialysis: Secondary | ICD-10-CM | POA: Diagnosis not present

## 2016-03-25 DIAGNOSIS — R34 Anuria and oliguria: Secondary | ICD-10-CM | POA: Diagnosis present

## 2016-03-25 DIAGNOSIS — J9621 Acute and chronic respiratory failure with hypoxia: Secondary | ICD-10-CM | POA: Diagnosis not present

## 2016-03-25 DIAGNOSIS — I462 Cardiac arrest due to underlying cardiac condition: Secondary | ICD-10-CM | POA: Diagnosis present

## 2016-03-25 DIAGNOSIS — E1122 Type 2 diabetes mellitus with diabetic chronic kidney disease: Secondary | ICD-10-CM | POA: Diagnosis present

## 2016-03-25 DIAGNOSIS — R632 Polyphagia: Secondary | ICD-10-CM | POA: Diagnosis present

## 2016-03-25 DIAGNOSIS — R6521 Severe sepsis with septic shock: Secondary | ICD-10-CM | POA: Diagnosis present

## 2016-03-25 DIAGNOSIS — B9620 Unspecified Escherichia coli [E. coli] as the cause of diseases classified elsewhere: Secondary | ICD-10-CM | POA: Diagnosis present

## 2016-03-25 DIAGNOSIS — Z9911 Dependence on respirator [ventilator] status: Secondary | ICD-10-CM | POA: Diagnosis not present

## 2016-03-25 DIAGNOSIS — N183 Chronic kidney disease, stage 3 (moderate): Secondary | ICD-10-CM | POA: Diagnosis present

## 2016-03-25 DIAGNOSIS — Z9049 Acquired absence of other specified parts of digestive tract: Secondary | ICD-10-CM

## 2016-03-25 DIAGNOSIS — Z4659 Encounter for fitting and adjustment of other gastrointestinal appliance and device: Secondary | ICD-10-CM

## 2016-03-25 DIAGNOSIS — R109 Unspecified abdominal pain: Secondary | ICD-10-CM

## 2016-03-25 DIAGNOSIS — A419 Sepsis, unspecified organism: Secondary | ICD-10-CM

## 2016-03-25 DIAGNOSIS — N179 Acute kidney failure, unspecified: Secondary | ICD-10-CM | POA: Insufficient documentation

## 2016-03-25 DIAGNOSIS — R0600 Dyspnea, unspecified: Secondary | ICD-10-CM

## 2016-03-25 LAB — COMPREHENSIVE METABOLIC PANEL
ALBUMIN: 3.5 g/dL (ref 3.5–5.0)
ALK PHOS: 45 U/L (ref 38–126)
ALK PHOS: 31 U/L — AB (ref 38–126)
ALT: 10 U/L — AB (ref 14–54)
ALT: 12 U/L — AB (ref 14–54)
AST: 30 U/L (ref 15–41)
AST: 30 U/L (ref 15–41)
Albumin: 2.6 g/dL — ABNORMAL LOW (ref 3.5–5.0)
Anion gap: 9 (ref 5–15)
Anion gap: 7 (ref 5–15)
BILIRUBIN TOTAL: 0.4 mg/dL (ref 0.3–1.2)
BUN: 15 mg/dL (ref 6–20)
BUN: 22 mg/dL — AB (ref 6–20)
CALCIUM: 9 mg/dL (ref 8.9–10.3)
CALCIUM: 8.4 mg/dL — AB (ref 8.9–10.3)
CHLORIDE: 99 mmol/L — AB (ref 101–111)
CO2: 28 mmol/L (ref 22–32)
CO2: 28 mmol/L (ref 22–32)
CREATININE: 1.2 mg/dL — AB (ref 0.44–1.00)
CREATININE: 1.55 mg/dL — AB (ref 0.44–1.00)
Chloride: 98 mmol/L — ABNORMAL LOW (ref 101–111)
GFR calc Af Amer: 57 mL/min — ABNORMAL LOW (ref >60)
GFR calc non Af Amer: 49 mL/min — ABNORMAL LOW (ref >60)
GFR, EST AFRICAN AMERICAN: 42 mL/min — AB (ref >60)
GFR, EST NON AFRICAN AMERICAN: 36 mL/min — AB (ref >60)
GLUCOSE: 126 mg/dL — AB (ref 65–99)
Glucose, Bld: 97 mg/dL (ref 65–99)
Potassium: 6.2 mmol/L — ABNORMAL HIGH (ref 3.5–5.1)
Potassium: 4.2 mmol/L (ref 3.5–5.1)
SODIUM: 135 mmol/L (ref 135–145)
Sodium: 134 mmol/L — ABNORMAL LOW (ref 135–145)
TOTAL PROTEIN: 8.2 g/dL — AB (ref 6.5–8.1)
Total Bilirubin: 0.6 mg/dL (ref 0.3–1.2)
Total Protein: 6.3 g/dL — ABNORMAL LOW (ref 6.5–8.1)

## 2016-03-25 LAB — URINALYSIS COMPLETE WITH MICROSCOPIC (ARMC ONLY)
BILIRUBIN URINE: NEGATIVE
Glucose, UA: NEGATIVE mg/dL
HGB URINE DIPSTICK: NEGATIVE
Ketones, ur: NEGATIVE mg/dL
LEUKOCYTES UA: NEGATIVE
Nitrite: POSITIVE — AB
PH: 5 (ref 5.0–8.0)
PROTEIN: 100 mg/dL — AB
Specific Gravity, Urine: 1.02 (ref 1.005–1.030)

## 2016-03-25 LAB — HEPARIN LEVEL (UNFRACTIONATED)
HEPARIN UNFRACTIONATED: 0.43 [IU]/mL (ref 0.30–0.70)
HEPARIN UNFRACTIONATED: 0.34 [IU]/mL (ref 0.30–0.70)

## 2016-03-25 LAB — CBC WITH DIFFERENTIAL/PLATELET
BASOS PCT: 0 %
Basophils Absolute: 0 10*3/uL (ref 0–0.1)
EOS ABS: 0 10*3/uL (ref 0–0.7)
EOS PCT: 0 %
HEMATOCRIT: 32.8 % — AB (ref 35.0–47.0)
HEMOGLOBIN: 9.8 g/dL — AB (ref 12.0–16.0)
LYMPHS PCT: 48 %
Lymphs Abs: 8.6 10*3/uL — ABNORMAL HIGH (ref 1.0–3.6)
MCH: 22.8 pg — ABNORMAL LOW (ref 26.0–34.0)
MCHC: 29.8 g/dL — ABNORMAL LOW (ref 32.0–36.0)
MCV: 76.6 fL — ABNORMAL LOW (ref 80.0–100.0)
Monocytes Absolute: 1.1 10*3/uL — ABNORMAL HIGH (ref 0.2–0.9)
Monocytes Relative: 6 %
NEUTROS PCT: 46 %
Neutro Abs: 8.2 10*3/uL — ABNORMAL HIGH (ref 1.4–6.5)
Platelets: 320 10*3/uL (ref 150–440)
RBC: 4.29 MIL/uL (ref 3.80–5.20)
RDW: 18.9 % — ABNORMAL HIGH (ref 11.5–14.5)
WBC: 17.9 10*3/uL — AB (ref 3.6–11.0)

## 2016-03-25 LAB — APTT: aPTT: 79 seconds — ABNORMAL HIGH (ref 24–36)

## 2016-03-25 LAB — BLOOD GAS, ARTERIAL
Acid-Base Excess: 4.4 mmol/L — ABNORMAL HIGH (ref 0.0–3.0)
Allens test (pass/fail): POSITIVE — AB
BICARBONATE: 31.7 meq/L — AB (ref 21.0–28.0)
FIO2: 0.4
LHR: 16 {breaths}/min
Mechanical Rate: 16
O2 SAT: 97.4 %
PATIENT TEMPERATURE: 37
PCO2 ART: 63 mmHg — AB (ref 32.0–48.0)
PEEP/CPAP: 5 cmH2O
PH ART: 7.31 — AB (ref 7.350–7.450)
PO2 ART: 103 mmHg (ref 83.0–108.0)
VT: 550 mL

## 2016-03-25 LAB — TROPONIN I
Troponin I: 0.56 ng/mL — ABNORMAL HIGH (ref <0.031)
Troponin I: 0.81 ng/mL — ABNORMAL HIGH (ref <0.031)
Troponin I: 0.42 ng/mL — ABNORMAL HIGH (ref <0.031)

## 2016-03-25 LAB — BASIC METABOLIC PANEL
Anion gap: 5 (ref 5–15)
BUN: 20 mg/dL (ref 6–20)
CHLORIDE: 100 mmol/L — AB (ref 101–111)
CO2: 30 mmol/L (ref 22–32)
CREATININE: 1.59 mg/dL — AB (ref 0.44–1.00)
Calcium: 8.7 mg/dL — ABNORMAL LOW (ref 8.9–10.3)
GFR calc Af Amer: 40 mL/min — ABNORMAL LOW (ref >60)
GFR calc non Af Amer: 35 mL/min — ABNORMAL LOW (ref >60)
GLUCOSE: 100 mg/dL — AB (ref 65–99)
Potassium: 4.3 mmol/L (ref 3.5–5.1)
SODIUM: 135 mmol/L (ref 135–145)

## 2016-03-25 LAB — GLUCOSE, CAPILLARY
GLUCOSE-CAPILLARY: 129 mg/dL — AB (ref 65–99)
GLUCOSE-CAPILLARY: 96 mg/dL (ref 65–99)
Glucose-Capillary: 122 mg/dL — ABNORMAL HIGH (ref 65–99)
Glucose-Capillary: 99 mg/dL (ref 65–99)
Glucose-Capillary: 90 mg/dL (ref 65–99)

## 2016-03-25 LAB — PROTIME-INR
INR: 1.36
PROTHROMBIN TIME: 16.9 s — AB (ref 11.4–15.0)

## 2016-03-25 LAB — LACTIC ACID, PLASMA
LACTIC ACID, VENOUS: 1.4 mmol/L (ref 0.5–2.0)
Lactic Acid, Venous: 4.1 mmol/L (ref 0.5–2.0)

## 2016-03-25 LAB — PROCALCITONIN: Procalcitonin: <0.1 ng/mL

## 2016-03-25 LAB — LIPASE, BLOOD: Lipase: 18 U/L (ref 11–51)

## 2016-03-25 LAB — BRAIN NATRIURETIC PEPTIDE: B Natriuretic Peptide: 406 pg/mL — ABNORMAL HIGH (ref 0.0–100.0)

## 2016-03-25 LAB — TRIGLYCERIDES: Triglycerides: 115 mg/dL (ref <150)

## 2016-03-25 LAB — MRSA PCR SCREENING: MRSA by PCR: POSITIVE — AB

## 2016-03-25 MED ORDER — METRONIDAZOLE IN NACL 5-0.79 MG/ML-% IV SOLN
500.0000 mg | Freq: Three times a day (TID) | INTRAVENOUS | Status: DC
  Filled 2016-03-25 (×4): qty 100

## 2016-03-25 MED ORDER — FENTANYL 2500MCG IN NS 250ML (10MCG/ML) PREMIX INFUSION
25.0000 ug/h | INTRAVENOUS | Status: DC
  Administered 2016-03-26: 50 ug/h via INTRAVENOUS
  Administered 2016-03-27 – 2016-03-28 (×5): 400 ug/h via INTRAVENOUS
  Administered 2016-03-28: 250 ug/h via INTRAVENOUS
  Administered 2016-03-28: 400 ug/h via INTRAVENOUS
  Filled 2016-03-25 (×9): qty 250

## 2016-03-25 MED ORDER — PROPOFOL 1000 MG/100ML IV EMUL
INTRAVENOUS | Status: DC | PRN
  Administered 2016-03-25: 30 ug/kg/min via INTRAVENOUS

## 2016-03-25 MED ORDER — PROPOFOL 1000 MG/100ML IV EMUL
0.0000 ug/kg/min | INTRAVENOUS | Status: DC
  Administered 2016-03-25: 40 ug/kg/min via INTRAVENOUS
  Administered 2016-03-25: 42 ug/kg/min via INTRAVENOUS
  Administered 2016-03-25: 35 ug/kg/min via INTRAVENOUS
  Administered 2016-03-25 (×3): 42 ug/kg/min via INTRAVENOUS
  Administered 2016-03-26: 30 ug/kg/min via INTRAVENOUS
  Administered 2016-03-26: 25 ug/kg/min via INTRAVENOUS
  Administered 2016-03-26: 30 ug/kg/min via INTRAVENOUS
  Administered 2016-03-26: 34 ug/kg/min via INTRAVENOUS
  Administered 2016-03-26: 30 ug/kg/min via INTRAVENOUS
  Filled 2016-03-25 (×12): qty 100

## 2016-03-25 MED ORDER — PIPERACILLIN-TAZOBACTAM 3.375 G IVPB 30 MIN
3.3750 g | Freq: Once | INTRAVENOUS | Status: AC
  Administered 2016-03-25: 3.375 g via INTRAVENOUS
  Filled 2016-03-25: qty 50

## 2016-03-25 MED ORDER — BUDESONIDE 0.25 MG/2ML IN SUSP
0.2500 mg | Freq: Four times a day (QID) | RESPIRATORY_TRACT | Status: DC
  Administered 2016-03-25 – 2016-04-15 (×84): 0.25 mg via RESPIRATORY_TRACT
  Filled 2016-03-25 (×86): qty 2

## 2016-03-25 MED ORDER — LEVOTHYROXINE SODIUM 100 MCG IV SOLR
37.5000 ug | Freq: Every day | INTRAVENOUS | Status: DC
  Administered 2016-03-25 – 2016-04-09 (×16): 37.5 ug via INTRAVENOUS
  Filled 2016-03-25 (×17): qty 5

## 2016-03-25 MED ORDER — HEPARIN BOLUS VIA INFUSION
6000.0000 [IU] | Freq: Once | INTRAVENOUS | Status: AC
  Administered 2016-03-25: 6000 [IU] via INTRAVENOUS
  Filled 2016-03-25: qty 6000

## 2016-03-25 MED ORDER — ATROPINE SULFATE 1 MG/ML IJ SOLN
INTRAMUSCULAR | Status: AC | PRN
  Administered 2016-03-25: 1 mg via INTRAVENOUS

## 2016-03-25 MED ORDER — INSULIN ASPART 100 UNIT/ML ~~LOC~~ SOLN
0.0000 [IU] | SUBCUTANEOUS | Status: DC
  Filled 2016-03-25: qty 10

## 2016-03-25 MED ORDER — INSULIN ASPART 100 UNIT/ML ~~LOC~~ SOLN
0.0000 [IU] | SUBCUTANEOUS | Status: DC
  Administered 2016-03-26 – 2016-04-09 (×6): 2 [IU] via SUBCUTANEOUS
  Filled 2016-03-25 (×6): qty 2

## 2016-03-25 MED ORDER — DEXTROSE-NACL 5-0.9 % IV SOLN
INTRAVENOUS | Status: DC
  Administered 2016-03-25: 13:00:00 via INTRAVENOUS

## 2016-03-25 MED ORDER — IPRATROPIUM-ALBUTEROL 0.5-2.5 (3) MG/3ML IN SOLN
3.0000 mL | Freq: Four times a day (QID) | RESPIRATORY_TRACT | Status: DC
  Administered 2016-03-25 – 2016-04-15 (×82): 3 mL via RESPIRATORY_TRACT
  Filled 2016-03-25 (×83): qty 3

## 2016-03-25 MED ORDER — EPINEPHRINE HCL 0.1 MG/ML IJ SOSY
PREFILLED_SYRINGE | INTRAMUSCULAR | Status: AC | PRN
  Administered 2016-03-25 (×3): 1 via INTRAVENOUS

## 2016-03-25 MED ORDER — SODIUM CHLORIDE 0.9 % IV BOLUS (SEPSIS)
1000.0000 mL | Freq: Once | INTRAVENOUS | Status: AC
  Administered 2016-03-25: 500 mL via INTRAVENOUS

## 2016-03-25 MED ORDER — VANCOMYCIN HCL 10 G IV SOLR
1500.0000 mg | Freq: Two times a day (BID) | INTRAVENOUS | Status: DC
  Administered 2016-03-25 – 2016-03-26 (×3): 1500 mg via INTRAVENOUS
  Filled 2016-03-25 (×4): qty 1500

## 2016-03-25 MED ORDER — HEPARIN (PORCINE) IN NACL 100-0.45 UNIT/ML-% IJ SOLN
1600.0000 [IU]/h | INTRAMUSCULAR | Status: DC
  Administered 2016-03-25 – 2016-03-26 (×3): 1600 [IU]/h via INTRAVENOUS
  Filled 2016-03-25 (×8): qty 250

## 2016-03-25 MED ORDER — ANTISEPTIC ORAL RINSE SOLUTION (CORINZ)
7.0000 mL | OROMUCOSAL | Status: DC
  Administered 2016-03-25 – 2016-04-10 (×155): 7 mL via OROMUCOSAL
  Filled 2016-03-25 (×167): qty 7

## 2016-03-25 MED ORDER — NOREPINEPHRINE 4 MG/250ML-% IV SOLN
INTRAVENOUS | Status: AC
Start: 2016-03-25 — End: 2016-03-25
  Filled 2016-03-25: qty 250

## 2016-03-25 MED ORDER — ONDANSETRON HCL 4 MG/2ML IJ SOLN: 4.0000 mg | Freq: Four times a day (QID) | INTRAMUSCULAR | Status: DC | PRN

## 2016-03-25 MED ORDER — FENTANYL CITRATE (PF) 100 MCG/2ML IJ SOLN
50.0000 ug | Freq: Once | INTRAMUSCULAR | Status: DC
  Filled 2016-03-25 (×2): qty 2

## 2016-03-25 MED ORDER — DEXTROSE 5 % IV SOLN
2.0000 g | Freq: Three times a day (TID) | INTRAVENOUS | Status: DC
  Administered 2016-03-25 – 2016-03-28 (×9): 2 g via INTRAVENOUS
  Filled 2016-03-25 (×11): qty 2

## 2016-03-25 MED ORDER — ALBUTEROL SULFATE (2.5 MG/3ML) 0.083% IN NEBU
2.5000 mg | INHALATION_SOLUTION | RESPIRATORY_TRACT | Status: DC | PRN
  Administered 2016-04-03 – 2016-04-12 (×2): 2.5 mg via RESPIRATORY_TRACT
  Filled 2016-03-25: qty 3

## 2016-03-25 MED ORDER — METRONIDAZOLE IN NACL 5-0.79 MG/ML-% IV SOLN
500.0000 mg | Freq: Three times a day (TID) | INTRAVENOUS | Status: DC
  Administered 2016-03-25 – 2016-03-28 (×10): 500 mg via INTRAVENOUS
  Filled 2016-03-25 (×11): qty 100

## 2016-03-25 MED ORDER — FENTANYL CITRATE (PF) 100 MCG/2ML IJ SOLN
50.0000 ug | INTRAMUSCULAR | Status: DC | PRN
  Administered 2016-03-25 – 2016-03-26 (×3): 50 ug via INTRAVENOUS
  Filled 2016-03-25 (×3): qty 2

## 2016-03-25 MED ORDER — VANCOMYCIN HCL 10 G IV SOLR
1500.0000 mg | INTRAVENOUS | Status: AC
  Administered 2016-03-25: 1500 mg via INTRAVENOUS
  Filled 2016-03-25 (×2): qty 1500

## 2016-03-25 MED ORDER — ACETAMINOPHEN 325 MG PO TABS: 650.0000 mg | ORAL_TABLET | ORAL | Status: DC | PRN

## 2016-03-25 MED ORDER — PANTOPRAZOLE SODIUM 40 MG IV SOLR
40.0000 mg | Freq: Every day | INTRAVENOUS | Status: DC
  Administered 2016-03-25 – 2016-04-08 (×15): 40 mg via INTRAVENOUS
  Filled 2016-03-25 (×15): qty 40

## 2016-03-25 MED ORDER — CHLORHEXIDINE GLUCONATE 0.12% ORAL RINSE (MEDLINE KIT)
15.0000 mL | Freq: Two times a day (BID) | OROMUCOSAL | Status: DC
  Administered 2016-03-25 – 2016-04-10 (×32): 15 mL via OROMUCOSAL
  Filled 2016-03-25 (×34): qty 15

## 2016-03-25 MED ORDER — SODIUM CHLORIDE 0.9 % IV SOLN
250.0000 mL | INTRAVENOUS | Status: DC | PRN
Start: 2016-03-25 — End: 2016-04-15

## 2016-03-25 NOTE — ED Provider Notes (Signed)
[facility]  Department of Emergency Medicine   Code Blue CONSULT NOTE  Chief Complaint: Cardiac arrest/unresponsive   Level V Caveat: Unresponsive  History of present illness: I was contacted by the ICU for a CODE BLUE cardiac arrest upstairs and presented to the patient's bedside.    ROS: Unable to obtain, Level V caveat  Scheduled Meds: . antiseptic oral rinse  7 mL Mouth Rinse 10 times per day  . budesonide (PULMICORT) nebulizer solution  0.25 mg Nebulization 4 times per day  . ceFEPime (MAXIPIME) IV  2 g Intravenous 3 times per day  . chlorhexidine gluconate (SAGE KIT)  15 mL Mouth Rinse BID  . fentaNYL (SUBLIMAZE) injection  50 mcg Intravenous Once  . insulin aspart  0-20 Units Subcutaneous 6 times per day  . ipratropium-albuterol  3 mL Nebulization Q6H  . metronidazole  500 mg Intravenous Q8H  . norepinephrine      . pantoprazole (PROTONIX) IV  40 mg Intravenous QHS  . vancomycin  1,500 mg Intravenous Q12H   Continuous Infusions: . dextrose 5 % and 0.9% NaCl 50 mL/hr at 03/25/16 1253  . fentaNYL infusion INTRAVENOUS    . heparin 1,600 Units/hr (03/25/16 5956)  . propofol 30 mcg/kg/min (03/25/16 0730)  . propofol (DIPRIVAN) infusion 42 mcg/kg/min (03/25/16 1253)   PRN Meds:.sodium chloride, acetaminophen, albuterol, fentaNYL, ondansetron (ZOFRAN) IV, propofol No past medical history on file. No past surgical history on file. Social History   Social History  . Marital Status: Married    Spouse Name: N/A  . Number of Children: N/A  . Years of Education: N/A   Occupational History  . Not on file.   Social History Main Topics  . Smoking status: Not on file  . Smokeless tobacco: Not on file  . Alcohol Use: Not on file  . Drug Use: Not on file  . Sexual Activity: Not on file   Other Topics Concern  . Not on file   Social History Narrative  . No narrative on file   Allergies  Allergen Reactions  . Codeine Hives  . Sulfa Antibiotics Hives     Last set of Vital Signs (not current) Filed Vitals:   03/25/16 1300 03/25/16 1400  BP:  141/73  Pulse: 73 73  Temp:    Resp: 29 27      Physical Exam Gen: unresponsive Cardiovascular: Pulse present, sinus tachycardia. Non-shockable rhythm. Resp: Breath sounds equal bilateral bagging. No hypoxia. Normal end-tidal waveform in the 40s. Endotracheally intubated prior to my arrival. Abd: nondistended  Neuro: GCS 3, unresponsive to pain  Neck: No crepitus  Musculoskeletal: No deformity no significant lower extremity edema bilateral Skin: warm   CRITICAL CARE Performed by: Delman Kitten Total critical care time: 15 min Critical care time was exclusive of separately billable procedures and treating other patients. Critical care was necessary to treat or prevent imminent or life-threatening deterioration. Critical care was time spent personally by me on the following activities: development of treatment plan with patient and/or surrogate as well as nursing, discussions with consultants, evaluation of patient's response to treatment, examination of patient, obtaining history from patient or surrogate, ordering and performing treatments and interventions, ordering and review of laboratory studies, ordering and review of radiographic studies, pulse oximetry and re-evaluation of patient's condition.  Cardiopulmonary Resuscitation (CPR) Procedure Note   Medical Decision making  Patient had a brief cardiac arrest which improved after 2 minutes of CPR. Never any shockable rhythm. Return of spontaneous circulation without defibrillation.  Airway managed and affirmed, confirmed.  Nursing reports that the patient's potassium was 6.2.  I ordered 1 amp of bicarbonate, calcium chloride. Within just a few minutes Dr. Jamal Collin of the ICU arrived and took over care. Patient with pulse present, ventilation confirmed at the time of transfer to Dr. Jamal Collin.   Assessment and Plan  Pulseless electrical  activity with brief cardiac arrest. Now return of spontaneous circulation. Postarrest transfered and assume to Dr. Jamal Collin at bedside.   Delman Kitten, MD 03/25/16 [phone]

## 2016-03-25 NOTE — H&P (Addendum)
PULMONARY / CRITICAL CARE MEDICINE   Name: Tasha Long MRN: [id number] DOB: Oct 06, 1956    ADMISSION DATE:  03/25/2016   PT PROFILE:   89 F in chronically very poor health developed abdominal pain and EMS was dispatched. Suffered respiratory arrest and brief cardiac arrest in the transfer from her bed to wheelchair and into ambulance. Intubated and transported to ED. Suffered brief PEA arrest again in ICU shortly after arrival  MAJOR EVENTS/TEST RESULTS: 04/14 TTE:  04/14 LE venous US:   INDWELLING DEVICES:: ETT 04/14 >>  R Odin CVL 04/14 >>   MICRO DATA: Urine 04/14 >>  Resp 04/14 >>  Blood 04/14 >>   PCT algorithm 04/14:   ANTIMICROBIALS:  Vanc 04/14 >>  Metronidazole 04/14 >>  Cefepime 04/14 >>   HISTORY OF PRESENT ILLNESS:   As above. Pt unable to provide history. Family reports that she was recently hospitalized for 3 weeks @ [facility] for respiratory failure  PAST MEDICAL HISTORY :  Chronic respiratory failure COPD - Symbicort, Spiriva, albuterol, duoneb Obesity OSA Bipolar disorder, anxiety - Celexa, buspirone H/O MRSA cellulitis IBS, GERD - Omeprazole Htn - Lisinopril Hyperlipidemia - cholestyramine  Hypothyroidism - Levothyroxine 75 mcg daily Severe lymphedema with stasis dermatitis  PAST SURGICAL HISTORY: C section, Cholecystectomy, Hysterectomy,   Allergies  Allergen Reactions  . Codeine Hives  . Sulfa Antibiotics Hives    No current facility-administered medications on file prior to encounter.   No current outpatient prescriptions on file prior to encounter.    FAMILY HISTORY:  N/C  SOCIAL HISTORY: Smoker Profoundly impaired @ baseline but lives at home with daughter  REVIEW OF SYSTEMS:   Level 5 caveat   SUBJECTIVE:    VITAL SIGNS: BP 137/67 mmHg  Pulse 66  Temp(Src) 97.6 F (36.4 C) (Oral)  Resp 16  Ht 5\' 5"  (1.651 m)  Wt 174 kg (383 lb 9.6 oz)  BMI 63.83 kg/m2  SpO2 99%  HEMODYNAMICS:    VENTILATOR  SETTINGS: Vent Mode:  [-] PRVC FiO2 (%):  [35 %-100 %] 35 % Set Rate:  [16 bmp-18 bmp] 16 bmp Vt Set:  [500 mL-550 mL] 500 mL PEEP:  [5 cmH20] 5 cmH20  INTAKE / OUTPUT:    PHYSICAL EXAMINATION: General: Morbidly obese, RASS -5  Neuro: Minimally responsive, PERRL, EOMI, MAEs HEENT: NCAT Cardiovascular: distant HS, regular, no M noted Lungs: No wheezes, prolonged exp time Abdomen: Severely obese, absent BS Ext: severe LE lymphedema with severe chronic stasis changes  LABS:  BMET  Recent Labs Lab 03/25/16 0719  NA 135  K 6.2*  CL 98*  CO2 28  BUN 15  CREATININE 1.20*  GLUCOSE 126*    Electrolytes  Recent Labs Lab 03/25/16 0719  CALCIUM 9.0    CBC  Recent Labs Lab 03/25/16 0719  WBC 17.9*  HGB 9.8*  HCT 32.8*  PLT 320    Coag's  Recent Labs Lab 03/25/16 0719 03/25/16 1303  APTT SPECIMEN CLOTTED 79*  INR SPECIMEN CLOTTED 1.36    Sepsis Markers  Recent Labs Lab 03/25/16 0719 03/25/16 1303  LATICACIDVEN 4.1* 1.4  PROCALCITON <0.10  --     ABG  Recent Labs Lab 03/25/16 0808  PHART 7.31*  PCO2ART 63*  PO2ART 103    Liver Enzymes  Recent Labs Lab 03/25/16 0719  AST 30  ALT 10*  ALKPHOS 45  BILITOT 0.4  ALBUMIN 3.5    Cardiac Enzymes  Recent Labs Lab 03/25/16 0719  TROPONINI 0.56*    Glucose  Recent Labs Lab 03/25/16 0927 03/25/16 1156  GLUCAP 122* 99    CXR: CM, R>L interstitial prominence    DISCUSSION: Chronically ill, morbidly obese woman  ASSESSMENT / PLAN:  PULMONARY A: Respiratory arrest COPD OSA Smoker Concern for massive PE given numerous RFs - too unstable to obtain CTA chest P:   Vent settings established Vent bundle implemented Daily SBT as indicated Nebulized steroids and bronchodilators Empiric full dose heparin Consider CTA chest if able to stabilize  CARDIOVASCULAR A:  Cardiac arrest due to respiratory arrest Hypertension Hyperlipidemia Poor venous access P:  Cycle  cardiac markers Check echocardiogram MAP goal > 65 mmHg CVL placed  RENAL A:   AKI Hyperkalemia Very poor candidate for HD P:   Monitor BMET intermittently Monitor I/Os Correct electrolytes as indicated Recheck BMET PM 04/14  GASTROINTESTINAL A:   Morbid obesity Abdominal pain P:   SUP: IV PPI TF protocol Consider CTAP if able to stabilize  HEMATOLOGIC A:   Anemia with microcytosis - no overt bleeding P:  DVT px: full dose heparin heparin Monitor CBC intermittently Transfuse per usual guidelines  INFECTIOUS A:   Possible abdominal sepsis Possible PNA Possible LE cellulitis with prior hx of MRSA cellulitis P:   Monitor temp, WBC count Micro and abx as above  ENDOCRINE A:   Suspect insulin resistance though no history of DM Stress induced hyperglycemia Hypothyroidism P:   IV L-thyroxine. Transition to enteral when able SSI - mod scale  NEUROLOGIC A:   Post arrest anoxic encephalopathy ICU associated discomfort P:   RASS goal: -2, -3 PAD protocol Daily WUA   FAMILY  - Updates: daughter and son-in-law updated in detail. Guarded prognosis conveyed  CCM time: 70 mins The above time includes time spent in consultation with patient and/or family members and reviewing care plan on multidisciplinary rounds  Merton Border, MD PCCM service Mobile [phone] Pager [phone]     03/25/2016, 3:53 PM
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
## 2016-03-25 NOTE — Progress Notes (Signed)
Dr.Ram notified of pt emergency contact on record being the pt's husband who is living with her and helps takes care of her. Daughters informed staff that he will not be coming to the hospital and that they have been acting as Garment/textile technologist.

## 2016-03-25 NOTE — Progress Notes (Signed)
°   03/25/16 0800  Clinical Encounter Type  Visited With Family  Visit Type Initial  Referral From Nurse  Consult/Referral To Chaplain  Pastoral care visit with family.  Daughter Adonis Brook has been patient's caregiver and is a Office manager for her medical history and medications. [employer] Ext [phone]

## 2016-03-25 NOTE — Progress Notes (Signed)
ANTICOAGULATION CONSULT NOTE - Initial Consult  Pharmacy Consult for Heparin Indication: pulmonary embolus  Allergies  Allergen Reactions  . Codeine Hives  . Sulfa Antibiotics Hives   Patient Measurements: Height: 5' 5"  (165.1 cm) Weight: (!) 383 lb 9.6 oz (174 kg) IBW/kg (Calculated) : 57 Heparin Dosing Weight: 93.6 kg  Vital Signs: Temp: 96.8 F (36 C) (04/14 2300) Temp Source: Core (Comment) (04/14 2000) BP: 128/52 mmHg (04/14 2300) Pulse Rate: 70 (04/14 2300)  Labs:  Recent Labs  03/25/16 0719 03/25/16 1303 03/25/16 1529 03/25/16 1604 03/25/16 2228  HGB 9.8*  --   --   --   --   HCT 32.8*  --   --   --   --   PLT 320  --   --   --   --   APTT SPECIMEN CLOTTED 79*  --   --   --   LABPROT SPECIMEN CLOTTED 16.9*  --   --   --   INR SPECIMEN CLOTTED 1.36  --   --   --   HEPARINUNFRC  --   --   --  0.43 0.34  CREATININE 1.20*  --  1.59*  --  1.55*  TROPONINI 0.56*  --  0.81*  --  0.42*    Estimated Creatinine Clearance: 64.8 mL/min (by C-G formula based on Cr of 1.55).   Medical History: History reviewed. No pertinent past medical history.  Medications:  Scheduled:  . antiseptic oral rinse  7 mL Mouth Rinse 10 times per day  . budesonide (PULMICORT) nebulizer solution  0.25 mg Nebulization 4 times per day  . ceFEPime (MAXIPIME) IV  2 g Intravenous 3 times per day  . chlorhexidine gluconate (SAGE KIT)  15 mL Mouth Rinse BID  . fentaNYL (SUBLIMAZE) injection  50 mcg Intravenous Once  . insulin aspart  0-15 Units Subcutaneous 6 times per day  . ipratropium-albuterol  3 mL Nebulization Q6H  . levothyroxine  37.5 mcg Intravenous Daily  . metronidazole  500 mg Intravenous Q8H  . pantoprazole (PROTONIX) IV  40 mg Intravenous QHS  . sodium chloride  1,000 mL Intravenous Once  . vancomycin  1,500 mg Intravenous Q12H   Infusions:  . dextrose 5 % and 0.9% NaCl 50 mL/hr at 03/25/16 2300  . fentaNYL infusion INTRAVENOUS    . heparin 1,600 Units/hr (03/25/16 2300)   . propofol Stopped (03/25/16 1855)  . propofol (DIPRIVAN) infusion 30 mcg/kg/min (03/25/16 2300)    Assessment: 60 y/o F admitted with respiratory arrest ordered heparin for possible PE.   Goal of Therapy:  Heparin level 0.3-0.7 units/ml Monitor platelets by anticoagulation protocol: Yes   Plan:  Heparin level therapeutic x 2. Continue current rate. Pharmacy will continue to monitor daily.  Laural Benes, Pharm.D., BCPS Clinical Pharmacist 03/25/2016,11:53 PM

## 2016-03-25 NOTE — Progress Notes (Signed)
Pharmacy Antibiotic Note  Tasha Long is a 60 y.o. female admitted on 03/25/2016 with respiratory distress ordered empiric abx.  Pharmacy has been consulted for vancomycin, cefepime, and metronidazole dosing. Patient received Zosyn x 1.   Plan: Vancomycin 1500 IV every 12 hours with stacked dosing and a trough with the 5th total dose. Goal trough 15-20 mcg/mL.  Metronidazole 500 mg iv q 8 hours.   Cefepime 2 g iv q 8 hours starting 6 hours after [employer].   Height: 5\' 5"  (165.1 cm) Weight: (!) 383 lb 9.6 oz (174 kg) IBW/kg (Calculated) : 57  Temp (24hrs), Avg:99.1 F (37.3 C), Min:98.2 F (36.8 C), Max:99.6 F (37.6 C)   Recent Labs Lab 03/25/16 0719  WBC 17.9*  CREATININE 1.20*  LATICACIDVEN 4.1*    Estimated Creatinine Clearance: 83.7 mL/min (by C-G formula based on Cr of 1.2).    Allergies  Allergen Reactions  . Codeine Hives  . Sulfa Antibiotics Hives    Antimicrobials this admission: Zosyn 4/14 >> 4/14 vancomycin 4/14 >>  Cefepime 4/14 >> Metronidazole 4/14 >>  Dose adjustments this admission:   Microbiology results: 4/14 BCx: pending UCx: pending  Sputum: pending   Thank you for allowing pharmacy to be a part of this patient's care.  Ulice Dash D 03/25/2016 11:34 AM

## 2016-03-25 NOTE — Progress Notes (Signed)
*  PRELIMINARY RESULTS* Echocardiogram 2D Echocardiogram has been performed.  Laqueta Jean Hege 03/25/2016, 2:31 PM
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
## 2016-03-25 NOTE — Progress Notes (Signed)
Initial Nutrition Assessment  DOCUMENTATION CODES:   Morbid obesity  INTERVENTION:   -Recommend initiation of Adult Tube Feeding Protocol as soon as medically able  NUTRITION DIAGNOSIS:   Inadequate oral intake related to acute illness as evidenced by NPO status.  GOAL:   Provide needs based on ASPEN/SCCM guidelines  MONITOR:   Vent status, Labs, Weight trends, I & O's  REASON FOR ASSESSMENT:   Ventilator    ASSESSMENT:    Pt with abdominal pain with hx of chronic diarrhea with IBS, pt with respiratory arrest/cardiopulmonary arrest requiring intubation. No full history and physical in computer at time of assessment  S/p code blue this AM, pt remains on vent  No past medical history on file.  Diet Order:   NPO  Skin:  Reviewed, no issues  Last BM:  no BM documented   Digestive system: 14 french NG in place  Labs: potassium 6.2, Creatinine 1.2  Meds: D5-NS at 50 ml/hr, ss novolog, diprivan  Unable to complete Nutrition-Focused physical exam at this time.    Height:   Ht Readings from Last 1 Encounters:  03/25/16 5\' 5"  (1.651 m)    Weight:   Wt Readings from Last 1 Encounters:  03/25/16 383 lb 9.6 oz (174 kg)    BMI:  Body mass index is 63.83 kg/(m^2).  Estimated Nutritional Needs:   Kcal:  1254-1425 kcals (22-25 kcals/kg IBW per ASPEN guidelines for BMI >50)  Protein:  143 g (2.5 g/kg)   Fluid:  >1.7 L  EDUCATION NEEDS:   Education needs no appropriate at this time  [place], [place], [place] [phone] Pager  [phone] Weekend/On-Call Pager

## 2016-03-25 NOTE — Progress Notes (Signed)
Central line placed by Dr.Simonds. Chest xray taken. Verbal orders per Dr. Alva Garnet to use the Central line.  Will continue to assess.

## 2016-03-25 NOTE — Progress Notes (Signed)
ANTICOAGULATION CONSULT NOTE - Initial Consult  Pharmacy Consult for Heparin Indication: pulmonary embolus  Allergies  Allergen Reactions  . Codeine Hives  . Sulfa Antibiotics Hives   Patient Measurements: Height: _0  (165.1 cm) Weight: (!) 383 lb 9.6 oz (174 kg) IBW/kg (Calculated) : 57 Heparin Dosing Weight: 93.6 kg  Vital Signs: Temp: 97.4 F (36.3 C) (04/14 1600) Temp Source: Oral (04/14 1600) BP: 137/67 mmHg (04/14 1500) Pulse Rate: 66 (04/14 1500)  Labs:  Recent Labs  03/25/16 0719 03/25/16 1303 03/25/16 1529 03/25/16 1604  HGB 9.8*  --   --   --   HCT 32.8*  --   --   --   PLT 320  --   --   --   APTT SPECIMEN CLOTTED 79*  --   --   LABPROT SPECIMEN CLOTTED 16.9*  --   --   INR SPECIMEN CLOTTED 1.36  --   --   HEPARINUNFRC  --   --   --  0.43  CREATININE 1.20*  --  1.59*  --   TROPONINI 0.56*  --  0.81*  --     Estimated Creatinine Clearance: 63.2 mL/min (by C-G formula based on Cr of 1.59).   Medical History: No past medical history on file.  Medications:  Scheduled:  . antiseptic oral rinse  7 mL Mouth Rinse 10 times per day  . budesonide (PULMICORT) nebulizer solution  0.25 mg Nebulization 4 times per day  . ceFEPime (MAXIPIME) IV  2 g Intravenous 3 times per day  . chlorhexidine gluconate (SAGE KIT)  15 mL Mouth Rinse BID  . fentaNYL (SUBLIMAZE) injection  50 mcg Intravenous Once  . insulin aspart  0-15 Units Subcutaneous 6 times per day  . ipratropium-albuterol  3 mL Nebulization Q6H  . levothyroxine  37.5 mcg Intravenous Daily  . metronidazole  500 mg Intravenous Q8H  . norepinephrine      . pantoprazole (PROTONIX) IV  40 mg Intravenous QHS  . vancomycin  1,500 mg Intravenous Q12H   Infusions:  . dextrose 5 % and 0.9% NaCl 50 mL/hr at 03/25/16 1253  . fentaNYL infusion INTRAVENOUS    . heparin 1,600 Units/hr (03/25/16 1657)  . propofol 30 mcg/kg/min (03/25/16 0730)  . propofol (DIPRIVAN) infusion 42 mcg/kg/min (03/25/16 1526)     Assessment: 60 y/o F admitted with respiratory arrest ordered heparin for possible PE.   Goal of Therapy:  Heparin level 0.3-0.7 units/ml Monitor platelets by anticoagulation protocol: Yes   Plan:  First heparin level drawn at 1604 resulted within therapeutic range at 0.43. Will continue current infusion rate of 1600 units/hr. Confirmatory heparin level ordered in 6 hours at 2200 tonight.  CBC ordered with AM labs tomorrow. Thank you for the consult.  Lenis Noon, PharmD Clinical Pharmacist 03/25/2016,4:47 PM

## 2016-03-25 NOTE — Progress Notes (Signed)
Pt is in stable condition at this time. Pt remains compliant with the vent at 35% at this time. No signs of pain when using the CPOT pain assessment tool. Daughters at bedside most of the day. Pt had one brief episode of PEA this am. ROSC returned in less than 1 min.  Pt sedated on Propofol at 42 mcg/kg/ min, two PRN doses of 50 mcg of fentanyl needed for pt comfort during my shift.   Pt family educated on ICU.     Report given to oncoming nurse.

## 2016-03-25 NOTE — Progress Notes (Signed)
ANTICOAGULATION CONSULT NOTE - Initial Consult  Pharmacy Consult for Heparin Indication: pulmonary embolus  Allergies  Allergen Reactions  . Codeine Hives  . Sulfa Antibiotics Hives    Patient Measurements: Height: 5\' 5"  (165.1 cm) Weight: (!) 383 lb 9.6 oz (174 kg) IBW/kg (Calculated) : 57 Heparin Dosing Weight: 93.6 kg  Vital Signs: Temp: 98.2 F (36.8 C) (04/14 1000) Temp Source: Axillary (04/14 1000) BP: 125/92 mmHg (04/14 0900) Pulse Rate: 114 (04/14 0900)  Labs:  Recent Labs  03/25/16 0719  HGB 9.8*  HCT 32.8*  PLT 320  APTT SPECIMEN CLOTTED  LABPROT SPECIMEN CLOTTED  INR SPECIMEN CLOTTED  CREATININE 1.20*  TROPONINI 0.56*    Estimated Creatinine Clearance: 83.7 mL/min (by C-G formula based on Cr of 1.2).   Medical History: No past medical history on file.  Medications:  Scheduled:  . budesonide (PULMICORT) nebulizer solution  0.25 mg Nebulization 4 times per day  . ceFEPime (MAXIPIME) IV  2 g Intravenous 3 times per day  . fentaNYL (SUBLIMAZE) injection  50 mcg Intravenous Once  . insulin aspart  0-20 Units Subcutaneous 6 times per day  . ipratropium-albuterol  3 mL Nebulization Q6H  . metronidazole  500 mg Intravenous Q8H  . norepinephrine      . pantoprazole (PROTONIX) IV  40 mg Intravenous QHS  . vancomycin  1,500 mg Intravenous STAT  . vancomycin  1,500 mg Intravenous Q12H   Infusions:  . fentaNYL infusion INTRAVENOUS    . heparin 1,600 Units/hr (03/25/16 [id number])  . propofol 30 mcg/kg/min (03/25/16 0730)  . propofol (DIPRIVAN) infusion 42 mcg/kg/min (03/25/16 1124)    Assessment: 60 y/o F admitted with respiratory arrest ordered heparin for possible PE.   Goal of Therapy:  Heparin level 0.3-0.7 units/ml Monitor platelets by anticoagulation protocol: Yes   Plan:  Give 6000 units bolus x 1 Start heparin infusion at 1600 units/hr Check anti-Xa level in 6 hours and daily while on heparin Continue to monitor H&H and platelets  Ulice Dash D 03/25/2016,11:28 AM

## 2016-03-25 NOTE — Procedures (Signed)
PROCEDURE NOTE: CVL PLACEMENT  INDICATION:    Monitoring of central venous pressures and/or administration of medications optimally administered in central vein  CONSENT:   Risks of procedure as well as the alternatives were explained to the patient or surrogate. Consent for procedure obtained. A time out was performed.   PROCEDURE  Sterile technique was used including antiseptics, cap, gloves, gown, hand hygiene, mask and full body sheet.  Skin prep: Chlorhexidine; local anesthetic administered  A triple lumen catheter was placed in the R [place] vein using the Seldinger technique.   EVALUATION:  Blood flow good  Complications: No apparent complications  Patient tolerated the procedure well.  Chest X-ray revealed proper position and no pneumothorax   Merton Border, MD PCCM service Mobile [phone]

## 2016-03-26 ENCOUNTER — Inpatient Hospital Stay: Payer: Medicaid Other

## 2016-03-26 DIAGNOSIS — I469 Cardiac arrest, cause unspecified: Secondary | ICD-10-CM

## 2016-03-26 LAB — URINALYSIS COMPLETE WITH MICROSCOPIC (ARMC ONLY)
Bilirubin Urine: NEGATIVE
Glucose, UA: NEGATIVE mg/dL
Hgb urine dipstick: NEGATIVE
Leukocytes, UA: NEGATIVE
NITRITE: NEGATIVE
PH: 5 (ref 5.0–8.0)
PROTEIN: 100 mg/dL — AB
SPECIFIC GRAVITY, URINE: 1.014 (ref 1.005–1.030)

## 2016-03-26 LAB — ECHOCARDIOGRAM COMPLETE
Height: 65 in
Weight: 6137.61 oz

## 2016-03-26 LAB — BLOOD GAS, ARTERIAL
Acid-Base Excess: 5.2 mmol/L — ABNORMAL HIGH (ref 0.0–3.0)
Bicarbonate: 30.9 mEq/L — ABNORMAL HIGH (ref 21.0–28.0)
FIO2: 0.3
MECHANICAL RATE: 16
O2 SAT: 95.4 %
PCO2 ART: 51 mmHg — AB (ref 32.0–48.0)
PEEP: 5 cmH2O
PO2 ART: 79 mmHg — AB (ref 83.0–108.0)
Patient temperature: 37
VT: 550 mL
pH, Arterial: 7.39 (ref 7.350–7.450)

## 2016-03-26 LAB — BASIC METABOLIC PANEL
Anion gap: 4 — ABNORMAL LOW (ref 5–15)
BUN: 22 mg/dL — AB (ref 6–20)
CALCIUM: 8.3 mg/dL — AB (ref 8.9–10.3)
CO2: 30 mmol/L (ref 22–32)
CREATININE: 1.74 mg/dL — AB (ref 0.44–1.00)
Chloride: 99 mmol/L — ABNORMAL LOW (ref 101–111)
GFR calc non Af Amer: 31 mL/min — ABNORMAL LOW (ref >60)
GFR, EST AFRICAN AMERICAN: 36 mL/min — AB (ref >60)
Glucose, Bld: 86 mg/dL (ref 65–99)
Potassium: 4 mmol/L (ref 3.5–5.1)
SODIUM: 133 mmol/L — AB (ref 135–145)

## 2016-03-26 LAB — CBC
HCT: 27 % — ABNORMAL LOW (ref 35.0–47.0)
Hemoglobin: 8.4 g/dL — ABNORMAL LOW (ref 12.0–16.0)
MCH: 23.3 pg — AB (ref 26.0–34.0)
MCHC: 31 g/dL — AB (ref 32.0–36.0)
MCV: 75.2 fL — ABNORMAL LOW (ref 80.0–100.0)
PLATELETS: 195 10*3/uL (ref 150–440)
RBC: 3.59 MIL/uL — ABNORMAL LOW (ref 3.80–5.20)
RDW: 18.8 % — AB (ref 11.5–14.5)
WBC: 5.4 10*3/uL (ref 3.6–11.0)

## 2016-03-26 LAB — GLUCOSE, CAPILLARY
GLUCOSE-CAPILLARY: 75 mg/dL (ref 65–99)
GLUCOSE-CAPILLARY: 73 mg/dL (ref 65–99)
Glucose-Capillary: 83 mg/dL (ref 65–99)
Glucose-Capillary: 75 mg/dL (ref 65–99)
Glucose-Capillary: 79 mg/dL (ref 65–99)

## 2016-03-26 LAB — PHOSPHORUS: PHOSPHORUS: 4.6 mg/dL (ref 2.5–4.6)

## 2016-03-26 LAB — TSH: TSH: 2.374 u[IU]/mL (ref 0.350–4.500)

## 2016-03-26 LAB — PROCALCITONIN: PROCALCITONIN: 1.48 ng/mL

## 2016-03-26 LAB — HEPARIN LEVEL (UNFRACTIONATED): HEPARIN UNFRACTIONATED: 0.59 [IU]/mL (ref 0.30–0.70)

## 2016-03-26 MED ORDER — MIDAZOLAM HCL 2 MG/2ML IJ SOLN
2.0000 mg | Freq: Once | INTRAMUSCULAR | Status: AC
  Administered 2016-03-26: 2 mg via INTRAVENOUS
  Filled 2016-03-26: qty 2

## 2016-03-26 MED ORDER — MIDAZOLAM HCL 2 MG/2ML IJ SOLN
2.0000 mg | INTRAMUSCULAR | Status: DC | PRN
  Administered 2016-03-26 (×2): 2 mg via INTRAVENOUS
  Filled 2016-03-26 (×2): qty 2

## 2016-03-26 MED ORDER — POLYETHYLENE GLYCOL 3350 17 G PO PACK
17.0000 g | PACK | Freq: Two times a day (BID) | ORAL | Status: DC
  Administered 2016-03-26 – 2016-04-03 (×16): 17 g via ORAL
  Filled 2016-03-26 (×16): qty 1

## 2016-03-26 MED ORDER — DIATRIZOATE MEGLUMINE & SODIUM 66-10 % PO SOLN
15.0000 mL | ORAL | Status: AC
  Administered 2016-03-26 (×2): 15 mL via ORAL

## 2016-03-26 MED ORDER — MIDAZOLAM HCL 2 MG/2ML IJ SOLN: 2.0000 mg | INTRAMUSCULAR | Status: DC | PRN

## 2016-03-26 MED ORDER — FENTANYL CITRATE (PF) 100 MCG/2ML IJ SOLN
100.0000 ug | Freq: Once | INTRAMUSCULAR | Status: AC
Start: 2016-03-26 — End: 2016-03-26
  Administered 2016-03-26: 100 ug via INTRAVENOUS
  Filled 2016-03-26: qty 2

## 2016-03-26 MED ORDER — SENNOSIDES-DOCUSATE SODIUM 8.6-50 MG PO TABS
1.0000 | ORAL_TABLET | Freq: Two times a day (BID) | ORAL | Status: DC
  Administered 2016-03-26 – 2016-04-03 (×16): 1 via ORAL
  Filled 2016-03-26 (×18): qty 1

## 2016-03-26 MED ORDER — FENTANYL CITRATE (PF) 100 MCG/2ML IJ SOLN
100.0000 ug | INTRAMUSCULAR | Status: DC | PRN
  Administered 2016-03-27 – 2016-04-09 (×29): 100 ug via INTRAVENOUS
  Filled 2016-03-26 (×25): qty 2

## 2016-03-26 MED ORDER — MIDAZOLAM HCL 2 MG/2ML IJ SOLN
2.0000 mg | INTRAMUSCULAR | Status: AC | PRN
  Administered 2016-03-27: 4 mg via INTRAVENOUS
  Filled 2016-03-26: qty 4

## 2016-03-26 NOTE — Progress Notes (Signed)
eLink Physician-Brief Progress Note Patient Name: Tasha Long DOB: 09/22/1956 MRN: [id number]   Date of Service  03/26/2016  HPI/Events of Note  XR this am shows NG at [place] reports dark red   eICU Interventions  Advance 5 cm Aspirate NG to confirm no UGIB     Intervention Category Intermediate Interventions: Diagnostic test evaluation  Jymir Dunaj V. 03/26/2016, 4:40 PM

## 2016-03-26 NOTE — Progress Notes (Addendum)
[facility] Progess Note    ASSESSMENT/PLAN    PT PROFILE:  76 F in chronically very poor health developed abdominal pain and EMS was dispatched. Suffered respiratory arrest and brief cardiac arrest in the transfer from her bed to wheelchair and into ambulance. Intubated and transported to ED. Suffered brief PEA arrest again in ICU shortly after arrival   PULMONARY A: Respiratory arrest COPD OSA Smoker Concern for PE given numerous RFs - no CTA given AKI.  P:  Continue anticoagulation empirically.  Continue vent support, not weaning candidate at this time.  Elevated PCT concerning for sepsis.  Albuterol, pulmicort  Nebs.   CARDIOVASCULAR A:  Cardiac arrest due to respiratory arrest Hypertension Hyperlipidemia  P:  Cycle cardiac markers Echo completed,  Results pending.  MAP goal > 65 mmHg CVL placed  RENAL A:  AKI, increased creatinine today.  Hyperkalemia Very poor candidate for HD P:  Monitor I/O, monitor kidney function.  Continue IVF.   GASTROINTESTINAL A:  Morbid obesity Abdominal pain P:  SUP: IV PPI TF protocol Consider CTAP if able to stabilize-- if creat improves may consider IV contrast.   HEMATOLOGIC A:  Anemia with microcytosis - no overt bleeding P:  DVT px: full dose heparin heparin Monitor CBC intermittently Transfuse per usual guidelines  INFECTIOUS A:  Possible abdominal sepsis Possible PNA Possible LE cellulitis with prior hx of MRSA cellulitis P:  Monitor temp, WBC count Micro and abx as above  ENDOCRINE A:  Suspect insulin resistance though no history of DM Glucose controlled.  Hypothyroidism P:  IV L-thyroxine. Transition to enteral when able SSI - mod scale  NEUROLOGIC A:  Post arrest anoxic encephalopathy ICU associated discomfort P:  RASS goal: -2, -3 PAD protocol Daily WUA Continue fentanyl prn, propofol infusion.     MAJOR EVENTS/TEST  RESULTS: 04/14 TTE: pending.  04/14 LE venous US: negative.  03/25/16; abdomen US: negative, but incomplete exam.    INDWELLING DEVICES:: ETT 04/14 >>  R  CVL 04/14 >>   MICRO DATA: Urine 04/14 >> pending.  Resp 04/14 >> pending.  Blood 04/14 >> pending.  MRSA PCR positive.   PCT algorithm 04/14: 1.48  ANTIMICROBIALS:  Vanc 04/14 >>  Metronidazole 04/14 >>  Cefepime 04/14 >>   CXR reviewed images, ETT in position, continue right hilar prominence.  ---------------------------------------   ----------------------------------------   Name: Tasha Long MRN: [id number] DOB: 1956-11-27    ADMISSION DATE:  03/25/2016     SUBJECTIVE:   Pt currently on the ventilator, can not provide history or review of systems.     VITAL SIGNS: Temp:  [96.4 F (35.8 C)-99.6 F (37.6 C)] 97.7 F (36.5 C) (04/15 0600) Pulse Rate:  [66-114] 73 (04/15 0600) Resp:  [16-29] 25 (04/15 0600) BP: (84-148)/(43-92) 138/58 mmHg (04/15 0600) SpO2:  [94 %-100 %] 96 % (04/15 0600) FiO2 (%):  [30 %-50 %] 30 % (04/15 0600) Weight:  [383 lb 9.6 oz (174 kg)-388 lb 14.3 oz (176.4 kg)] 388 lb 14.3 oz (176.4 kg) (04/15 0500) HEMODYNAMICS: CVP:  [13 mmHg-19 mmHg] 13 mmHg VENTILATOR SETTINGS: Vent Mode:  [-] PRVC FiO2 (%):  [30 %-50 %] 30 % Set Rate:  [16 bmp] 16 bmp Vt Set:  [500 mL-550 mL] 550 mL PEEP:  [5 cmH20] 5 cmH20 Pressure Support:  [5 cmH20] 5 cmH20 INTAKE / OUTPUT:  Intake/Output Summary (Last 24 hours) at 03/26/16 0818 Last data filed at 03/26/16 0600  Gross per 24 hour  Intake 4673.91  ml  Output    525 ml  Net 4148.91 ml    PHYSICAL EXAMINATION: Physical Examination:   VS: BP 138/58 mmHg  Pulse 73  Temp(Src) 97.7 F (36.5 C) (Core (Comment))  Resp 25  Ht 5\' 5"  (1.651 m)  Wt 388 lb 14.3 oz (176.4 kg)  BMI 64.71 kg/m2  SpO2 96%  General Appearance: No distress  Neuro:without focal findings HEENT: PERRLA, EOM intact. Pulmonary: normal breath  sounds   CardiovascularNormal S1,S2.  No m/r/g.   Abdomen: Benign, minimal bowel sounds.  Renal:  No costovertebral tenderness  GU:  Not performed at this time. Endocrine: No evident thyromegaly. Skin:   warm, no rashes, no ecchymosis  Extremities: normal, no cyanosis, clubbing.   LABS:   LABORATORY PANEL:   CBC  Recent Labs Lab 03/26/16 0535  WBC 5.4  HGB 8.4*  HCT 27.0*  PLT 195    Chemistries   Recent Labs Lab 03/25/16 2228 03/26/16 0535  NA 134* 133*  K 4.2 4.0  CL 99* 99*  CO2 28 30  GLUCOSE 97 86  BUN 22* 22*  CREATININE 1.55* 1.74*  CALCIUM 8.4* 8.3*  PHOS  --  4.6  AST 30  --   ALT 12*  --   ALKPHOS 31*  --   BILITOT 0.6  --      Recent Labs Lab 03/25/16 1156 03/25/16 1635 03/25/16 2007 03/25/16 2357 03/26/16 0347 03/26/16 0716  GLUCAP 99 90 96 129* 75 83    Recent Labs Lab 03/25/16 0808 03/26/16 0427  PHART 7.31* 7.39  PCO2ART 63* 51*  PO2ART 103 79*    Recent Labs Lab 03/25/16 0719 03/25/16 2228  AST 30 30  ALT 10* 12*  ALKPHOS 45 31*  BILITOT 0.4 0.6  ALBUMIN 3.5 2.6*    Cardiac Enzymes  Recent Labs Lab 03/25/16 2228  TROPONINI 0.42*    RADIOLOGY:  Dg Chest 1 View  03/26/2016  CLINICAL DATA:  Dyspnea. EXAM: CHEST 1 VIEW COMPARISON:  03/25/2016. FINDINGS: Endotracheal tube in satisfactory position. Right jugular catheter tip in the proximal superior vena cava. The nasogastric tube is poorly visualized inferiorly. Stable enlarged cardiac silhouette and prominent interstitial markings. There is a persistent rounded density the in the inferior perihilar region on the right, measuring approximately 4.3 cm in maximum diameter. IMPRESSION: 1. Stable mass or rounded area of atelectasis or pneumonia in the right inferior perihilar region. 2. Stable cardiomegaly and chronic interstitial lung disease. Electronically Signed   By: Claudie Revering M.D.   On: 03/26/2016 07:37   Dg Chest 1 View  03/25/2016  CLINICAL DATA:  Central  line placement. EXAM: CHEST 1 VIEW COMPARISON:  03/25/2016.  05/15/2013 . FINDINGS: Endotracheal tube tip 4.3 cm above the carina. NG tube tip below left hemidiaphragm. Right central line noted with tip projected superior vena cava. Right perihilar rounded infiltrate. Pneumonia could present this fashion. Mass lesion cannot be excluded. Cardiomegaly with mild pulmonary venous interstitial prominence. Mild congestive heart failure cannot be excluded. Close follow-up chest x-rays are suggested demonstrate clearing of these findings. Degenerative changes thoracic spine. IMPRESSION: 1. Endotracheal tube 4.3 cm above the carina. NG tube tip below left hemidiaphragm. Right central line noted with tip projected superior vena cava. No complicating features. No pneumothorax. 2. Right perihilar infiltrate versus mass lesion. Close follow-up chest x-rays recommended to demonstrate complete clearing. Further evaluation with chest CT can be obtained as needed . 3. Cardiomegaly with mild pulmonary vascular prominence and interstitial prominence. A mild component congestive  heart failure cannot be excluded . Electronically Signed   By: Marcello Moores  Register   On: 03/25/2016 11:43   US Abdomen Complete  03/25/2016  CLINICAL DATA:  Abdominal pain EXAM: ABDOMEN ULTRASOUND COMPLETE COMPARISON:  None. FINDINGS: Gallbladder: Surgically absent Common bile duct: Diameter: 3 mm. Where visualized, no filling defect. Liver: No focal lesion identified. Within normal limits in parenchymal echogenicity. IVC: No abnormality visualized. Pancreas: Visualized portion unremarkable. Spleen: Size and appearance within normal limits. Right Kidney: Length: 10 cm. Echogenicity within normal limits. No mass or hydronephrosis visualized. Left Kidney: Length: 11 cm. Echogenicity within normal limits. No mass or hydronephrosis visualized. Abdominal aorta: Normal diameter proximal aorta at 22 mm. The mid and distal aorta was obscured by bowel gas. Other  findings: None. IMPRESSION: 1. No explanation for abdominal pain. 2. Bowel gas obscures the lower abdominal aorta. 3. Cholecystectomy. Electronically Signed   By: Monte Fantasia M.D.   On: 03/25/2016 08:25   US Venous Img Lower Bilateral  03/25/2016  CLINICAL DATA:  Bilateral lower extremity edema, morbid obesity EXAM: BILATERAL LOWER EXTREMITY VENOUS DOPPLER ULTRASOUND TECHNIQUE: Gray-scale sonography with graded compression, as well as color Doppler and duplex ultrasound were performed to evaluate the lower extremity deep venous systems from the level of the common femoral vein and including the common femoral, femoral, profunda femoral, popliteal and calf veins including the posterior tibial, peroneal and gastrocnemius veins when visible. The superficial great saphenous vein was also interrogated. Spectral Doppler was utilized to evaluate flow at rest and with distal augmentation maneuvers in the common femoral, femoral and popliteal veins. COMPARISON:  None. FINDINGS: Limited exam because of body habitus and condition. RIGHT LOWER EXTREMITY Common Femoral Vein: No evidence of thrombus. Normal compressibility, respiratory phasicity and response to augmentation. Saphenofemoral Junction: No evidence of thrombus. Normal compressibility and flow on color Doppler imaging. Profunda Femoral Vein: No evidence of thrombus. Normal compressibility and flow on color Doppler imaging. Femoral Vein: No evidence of thrombus. Normal compressibility, respiratory phasicity and response to augmentation. Popliteal Vein: No evidence of thrombus. Normal compressibility, respiratory phasicity and response to augmentation. Calf Veins: Very limited assessment, difficult to exclude small calf thrombus. Superficial Great Saphenous Vein: No evidence of thrombus. Normal compressibility and flow on color Doppler imaging. Venous Reflux:  None. Other Findings:  Peripheral subcutaneous edema noted. LEFT LOWER EXTREMITY Common Femoral Vein: No  evidence of thrombus. Normal compressibility, respiratory phasicity and response to augmentation. Saphenofemoral Junction: No evidence of thrombus. Normal compressibility and flow on color Doppler imaging. Profunda Femoral Vein: No evidence of thrombus. Normal compressibility and flow on color Doppler imaging. Femoral Vein: No evidence of thrombus. Normal compressibility, respiratory phasicity and response to augmentation. Popliteal Vein: No evidence of thrombus. Normal compressibility, respiratory phasicity and response to augmentation. Calf Veins: Very limited assessment, it difficult to exclude small calf thrombus. Superficial Great Saphenous Vein: No evidence of thrombus. Normal compressibility and flow on color Doppler imaging. Venous Reflux:  None. Other Findings:  Peripheral edema noted. IMPRESSION: Limited exam because of body habitus. No significant occlusive femoral popliteal DVT demonstrated in either extremity. Limited assessment of the calf veins. Electronically Signed   By: Jerilynn Mages.  Shick M.D.   On: 03/25/2016 16:16   Dg Chest Portable 1 View  03/25/2016  CLINICAL DATA:  Shortness of breath EXAM: PORTABLE CHEST 1 VIEW COMPARISON:  05/15/2013 FINDINGS: Endotracheal tube tip between the clavicular heads and carina. Diffuse coarse reticular nodular opacities. Borderline cardiomegaly, accentuated by technique. Negative aortic and hilar contours for technique. No  effusion or pneumothorax. No Kerley lines. IMPRESSION: 1. Unremarkable endotracheal tube positioning. 2. Diffuse interstitial opacity, favor atypical infection or aspiration over CHF. 3. Probable background COPD. Electronically Signed   By: Monte Fantasia M.D.   On: 03/25/2016 07:56       --Marda Stalker, MD.  ICU Pager: [phone] [facility] Office Number: [phone]  Patricia Pesa, M.D.  Vilinda Boehringer, M.D.  Merton Border, M.D  03/26/2016 Critical Care Attestation.  I have personally obtained a  history, examined the patient, evaluated laboratory and imaging results, formulated the assessment and plan and placed orders. The Patient requires high complexity decision making for assessment and support, frequent evaluation and titration of therapies, application of advanced monitoring technologies and extensive interpretation of multiple databases. The patient has critical illness that could lead imminently to failure of 1 or more organ systems and requires the highest level of physician preparedness to intervene.  Critical Care Time devoted to patient care services described in this note is 35 minutes and is exclusive of time spent in procedures.

## 2016-03-26 NOTE — Progress Notes (Signed)
12:30 Pt transported to CT this AM. Pt O2 levels dropped to 70's and had one episode vomiting. Reported to Dr.Ram. New orders given to hook OG to suction. Will continue to assess.   1400 Pt returned to room seen to have pink tinged emisis running from mouth. Reported to Dr.Ram.  Mouth suctioned.  No new orders given.  1600 While giving prescribed Miralax. Pt was observed to have bright red secretions from mouth. CVP has also elevated on my shift to 24 up from 16 at start of shift. Reported to Dr. Elsworth Soho. New orders given to advanced OG 5cm, hook to low intermittent suction and continue to assess. Orders given also to not use tube to administer medications at this time. No new orders regarding CVP at this time.   1800 During my shift today pt had multiple episodes of desaturation. Pt was continually having peak pressures in the high 50's to 60's. RR was continually over 25. Pt started on Fentanyl drip. Pt has since had RR rate drop to 16 and Peak pressure now at 44. Pt had minimal output from foley today. MD aware. CVP has increased to 23.   Pt in stable condition at this time. Report given to oncoming nurse.

## 2016-03-26 NOTE — Progress Notes (Signed)
ANTICOAGULATION CONSULT NOTE - Initial Consult  Pharmacy Consult for Heparin Indication: pulmonary embolus  Allergies  Allergen Reactions  . Codeine Hives  . Sulfa Antibiotics Hives   Patient Measurements: Height: _0  (165.1 cm) Weight: (!) 388 lb 14.3 oz (176.4 kg) IBW/kg (Calculated) : 57 Heparin Dosing Weight: 93.6 kg  Vital Signs: Temp: 97.9 F (36.6 C) (04/15 0500) Temp Source: Core (Comment) (04/15 0000) BP: 140/58 mmHg (04/15 0500) Pulse Rate: 74 (04/15 0500)  Labs:  Recent Labs  03/25/16 0719 03/25/16 1303 03/25/16 1529 03/25/16 1604 03/25/16 2228 03/26/16 0535  HGB 9.8*  --   --   --   --  8.4*  HCT 32.8*  --   --   --   --  27.0*  PLT 320  --   --   --   --  195  APTT SPECIMEN CLOTTED 79*  --   --   --   --   LABPROT SPECIMEN CLOTTED 16.9*  --   --   --   --   INR SPECIMEN CLOTTED 1.36  --   --   --   --   HEPARINUNFRC  --   --   --  0.43 0.34 0.59  CREATININE 1.20*  --  1.59*  --  1.55*  --   TROPONINI 0.56*  --  0.81*  --  0.42*  --     Estimated Creatinine Clearance: 65.5 mL/min (by C-G formula based on Cr of 1.55).   Medical History: History reviewed. No pertinent past medical history.  Medications:  Scheduled:  . antiseptic oral rinse  7 mL Mouth Rinse 10 times per day  . budesonide (PULMICORT) nebulizer solution  0.25 mg Nebulization 4 times per day  . ceFEPime (MAXIPIME) IV  2 g Intravenous 3 times per day  . chlorhexidine gluconate (SAGE KIT)  15 mL Mouth Rinse BID  . fentaNYL (SUBLIMAZE) injection  50 mcg Intravenous Once  . insulin aspart  0-15 Units Subcutaneous 6 times per day  . ipratropium-albuterol  3 mL Nebulization Q6H  . levothyroxine  37.5 mcg Intravenous Daily  . metronidazole  500 mg Intravenous Q8H  . pantoprazole (PROTONIX) IV  40 mg Intravenous QHS  . vancomycin  1,500 mg Intravenous Q12H   Infusions:  . dextrose 5 % and 0.9% NaCl 50 mL/hr at 03/26/16 0500  . fentaNYL infusion INTRAVENOUS    . heparin 1,600  Units/hr (03/26/16 0500)  . propofol Stopped (03/25/16 1855)  . propofol (DIPRIVAN) infusion 25 mcg/kg/min (03/26/16 0500)    Assessment: 60 y/o F admitted with respiratory arrest ordered heparin for possible PE.   Goal of Therapy:  Heparin level 0.3-0.7 units/ml Monitor platelets by anticoagulation protocol: Yes   Plan:  Heparin level therapeutic. Continue current rate. Pharmacy will continue to monitor daily.  Laural Benes, Pharm.D., BCPS Clinical Pharmacist 03/26/2016,6:12 AM

## 2016-03-27 ENCOUNTER — Inpatient Hospital Stay: Payer: Medicaid Other

## 2016-03-27 LAB — CBC
HCT: 26.7 % — ABNORMAL LOW (ref 35.0–47.0)
Hemoglobin: 8.2 g/dL — ABNORMAL LOW (ref 12.0–16.0)
MCH: 23.4 pg — ABNORMAL LOW (ref 26.0–34.0)
MCHC: 30.7 g/dL — ABNORMAL LOW (ref 32.0–36.0)
MCV: 76.2 fL — ABNORMAL LOW (ref 80.0–100.0)
PLATELETS: 192 10*3/uL (ref 150–440)
RBC: 3.51 MIL/uL — ABNORMAL LOW (ref 3.80–5.20)
RDW: 19.2 % — AB (ref 11.5–14.5)
WBC: 7 10*3/uL (ref 3.6–11.0)

## 2016-03-27 LAB — BLOOD GAS, ARTERIAL
Acid-Base Excess: 0.2 mmol/L (ref 0.0–3.0)
Acid-base deficit: 1.7 mmol/L (ref 0.0–2.0)
Allens test (pass/fail): POSITIVE — AB
BICARBONATE: 28.3 meq/L — AB (ref 21.0–28.0)
Bicarbonate: 25.9 mEq/L (ref 21.0–28.0)
FIO2: 0.4
FIO2: 40
LHR: 26 {breaths}/min
MECHANICAL RATE: 26
O2 Saturation: 92.6 %
O2 Saturation: 90.3 %
PATIENT TEMPERATURE: 37
PEEP/CPAP: 5 cmH2O
PEEP/CPAP: 5 cmH2O
PO2 ART: 77 mmHg — AB (ref 83.0–108.0)
PO2 ART: 69 mmHg — AB (ref 83.0–108.0)
PRESSURE CONTROL: 40 cmH2O
Patient temperature: 37
VT: 450 mL
pCO2 arterial: 66 mmHg — ABNORMAL HIGH (ref 32.0–48.0)
pCO2 arterial: 59 mmHg — ABNORMAL HIGH (ref 32.0–48.0)
pH, Arterial: 7.24 — ABNORMAL LOW (ref 7.350–7.450)
pH, Arterial: 7.25 — ABNORMAL LOW (ref 7.350–7.450)

## 2016-03-27 LAB — BASIC METABOLIC PANEL
ANION GAP: 6 (ref 5–15)
BUN: 24 mg/dL — ABNORMAL HIGH (ref 6–20)
CALCIUM: 7.9 mg/dL — AB (ref 8.9–10.3)
CO2: 27 mmol/L (ref 22–32)
Chloride: 101 mmol/L (ref 101–111)
Creatinine, Ser: 2.23 mg/dL — ABNORMAL HIGH (ref 0.44–1.00)
GFR, EST AFRICAN AMERICAN: 27 mL/min — AB (ref >60)
GFR, EST NON AFRICAN AMERICAN: 23 mL/min — AB (ref >60)
Glucose, Bld: 90 mg/dL (ref 65–99)
Potassium: 4.1 mmol/L (ref 3.5–5.1)
Sodium: 134 mmol/L — ABNORMAL LOW (ref 135–145)

## 2016-03-27 LAB — GLUCOSE, CAPILLARY
GLUCOSE-CAPILLARY: 69 mg/dL (ref 65–99)
GLUCOSE-CAPILLARY: 86 mg/dL (ref 65–99)
GLUCOSE-CAPILLARY: 86 mg/dL (ref 65–99)
GLUCOSE-CAPILLARY: 86 mg/dL (ref 65–99)
GLUCOSE-CAPILLARY: 99 mg/dL (ref 65–99)
Glucose-Capillary: 63 mg/dL — ABNORMAL LOW (ref 65–99)
Glucose-Capillary: 66 mg/dL (ref 65–99)
Glucose-Capillary: 72 mg/dL (ref 65–99)

## 2016-03-27 LAB — HEPARIN LEVEL (UNFRACTIONATED): Heparin Unfractionated: 0.53 IU/mL (ref 0.30–0.70)

## 2016-03-27 LAB — PROCALCITONIN: PROCALCITONIN: 1.59 ng/mL

## 2016-03-27 LAB — PHOSPHORUS: PHOSPHORUS: 5.5 mg/dL — AB (ref 2.5–4.6)

## 2016-03-27 LAB — CALCIUM, IONIZED: Calcium, Ionized, Serum: 4.8 mg/dL (ref 4.5–5.6)

## 2016-03-27 LAB — VANCOMYCIN, TROUGH
VANCOMYCIN TR: 46 ug/mL — AB (ref 10–20)
Vancomycin Tr: 55 ug/mL (ref 10–20)

## 2016-03-27 LAB — TRIGLYCERIDES: Triglycerides: 45 mg/dL (ref <150)

## 2016-03-27 LAB — MAGNESIUM: MAGNESIUM: 2.1 mg/dL (ref 1.7–2.4)

## 2016-03-27 MED ORDER — FUROSEMIDE 10 MG/ML IJ SOLN
8.0000 mg/h | INTRAVENOUS | Status: DC
  Filled 2016-03-27: qty 25

## 2016-03-27 MED ORDER — DEXTROSE 50 % IV SOLN
25.0000 mL | INTRAVENOUS | Status: AC
  Administered 2016-03-27: 25 mL via INTRAVENOUS

## 2016-03-27 MED ORDER — VECURONIUM BROMIDE 10 MG IV SOLR
INTRAVENOUS | Status: AC
Start: 2016-03-27 — End: 2016-03-27
  Administered 2016-03-27: 10 mg via INTRAVENOUS
  Filled 2016-03-27: qty 10

## 2016-03-27 MED ORDER — ALBUMIN HUMAN 5 % IV SOLN
12.5000 g | Freq: Once | INTRAVENOUS | Status: AC
  Administered 2016-03-27: 12.5 g via INTRAVENOUS
  Filled 2016-03-27: qty 250

## 2016-03-27 MED ORDER — DEXTROSE 50 % IV SOLN
INTRAVENOUS | Status: AC
  Filled 2016-03-27: qty 50

## 2016-03-27 MED ORDER — HEPARIN (PORCINE) IN NACL 100-0.45 UNIT/ML-% IJ SOLN
2400.0000 [IU]/h | INTRAMUSCULAR | Status: DC
  Administered 2016-03-28: 2000 [IU]/h via INTRAVENOUS
  Administered 2016-03-28: 1600 [IU]/h via INTRAVENOUS
  Administered 2016-03-30 – 2016-04-07 (×13): 2000 [IU]/h via INTRAVENOUS
  Administered 2016-04-08 – 2016-04-11 (×5): 2400 [IU]/h via INTRAVENOUS
  Filled 2016-03-27 (×56): qty 250

## 2016-03-27 MED ORDER — MIDAZOLAM BOLUS VIA INFUSION
2.0000 mg | INTRAVENOUS | Status: DC | PRN
  Administered 2016-03-27: 2 mg via INTRAVENOUS
  Filled 2016-03-27 (×2): qty 4

## 2016-03-27 MED ORDER — NOREPINEPHRINE 4 MG/250ML-% IV SOLN
0.0000 ug/min | INTRAVENOUS | Status: DC
  Administered 2016-03-27: 2 ug/min via INTRAVENOUS
  Administered 2016-03-28: 3 ug/min via INTRAVENOUS
  Filled 2016-03-27 (×2): qty 250

## 2016-03-27 MED ORDER — VECURONIUM BROMIDE 10 MG IV SOLR
10.0000 mg | Freq: Once | INTRAVENOUS | Status: AC
  Administered 2016-03-27: 10 mg via INTRAVENOUS

## 2016-03-27 MED ORDER — NOREPINEPHRINE BITARTRATE 1 MG/ML IV SOLN: 0.0000 ug/min | INTRAVENOUS | Status: DC

## 2016-03-27 MED ORDER — SODIUM CHLORIDE 0.9 % IV SOLN
0.0000 mg/h | INTRAVENOUS | Status: DC
  Administered 2016-03-27 (×2): 6 mg/h via INTRAVENOUS
  Administered 2016-03-27 – 2016-03-28 (×2): 2 mg/h via INTRAVENOUS
  Administered 2016-03-28: 6 mg/h via INTRAVENOUS
  Filled 2016-03-27 (×5): qty 10

## 2016-03-27 MED ORDER — FUROSEMIDE 10 MG/ML IJ SOLN
40.0000 mg | Freq: Once | INTRAMUSCULAR | Status: AC
Start: 2016-03-27 — End: 2016-03-27
  Administered 2016-03-27: 40 mg via INTRAVENOUS
  Filled 2016-03-27: qty 4

## 2016-03-27 MED ORDER — PROPOFOL 1000 MG/100ML IV EMUL
INTRAVENOUS | Status: AC
Start: 2016-03-27 — End: 2016-03-27
  Administered 2016-03-27: 20 ug/kg/min via INTRAVENOUS
  Filled 2016-03-27: qty 100

## 2016-03-27 MED ORDER — PROPOFOL 1000 MG/100ML IV EMUL
0.0000 ug/kg/min | INTRAVENOUS | Status: DC
  Administered 2016-03-27: 20 ug/kg/min via INTRAVENOUS

## 2016-03-27 NOTE — Progress Notes (Signed)
ANTICOAGULATION CONSULT NOTE - Initial Consult  Pharmacy Consult for Heparin Indication: pulmonary embolus  Allergies  Allergen Reactions  . Codeine Hives  . Sulfa Antibiotics Hives   Patient Measurements: Height: 5' 5"  (165.1 cm) Weight: (!) 388 lb 14.3 oz (176.4 kg) IBW/kg (Calculated) : 57 Heparin Dosing Weight: 93.6 kg  Vital Signs: Temp: 98.8 F (37.1 C) (04/16 0345) Temp Source: Core (Comment) (04/16 0000) BP: 123/35 mmHg (04/16 0345) Pulse Rate: 86 (04/16 0345)  Labs:  Recent Labs  03/25/16 0719 03/25/16 1303 03/25/16 1529  03/25/16 2228 03/26/16 0535 03/27/16 0352  HGB 9.8*  --   --   --   --  8.4* 8.2*  HCT 32.8*  --   --   --   --  27.0* 26.7*  PLT 320  --   --   --   --  195 192  APTT SPECIMEN CLOTTED 79*  --   --   --   --   --   LABPROT SPECIMEN CLOTTED 16.9*  --   --   --   --   --   INR SPECIMEN CLOTTED 1.36  --   --   --   --   --   HEPARINUNFRC  --   --   --   < > 0.34 0.59 0.53  CREATININE 1.20*  --  1.59*  --  1.55* 1.74* 2.23*  TROPONINI 0.56*  --  0.81*  --  0.42*  --   --   < > = values in this interval not displayed.  Estimated Creatinine Clearance: 45.5 mL/min (by C-G formula based on Cr of 2.23).   Medical History: History reviewed. No pertinent past medical history.  Medications:  Scheduled:  . antiseptic oral rinse  7 mL Mouth Rinse 10 times per day  . budesonide (PULMICORT) nebulizer solution  0.25 mg Nebulization 4 times per day  . ceFEPime (MAXIPIME) IV  2 g Intravenous 3 times per day  . chlorhexidine gluconate (SAGE KIT)  15 mL Mouth Rinse BID  . fentaNYL (SUBLIMAZE) injection  50 mcg Intravenous Once  . insulin aspart  0-15 Units Subcutaneous 6 times per day  . ipratropium-albuterol  3 mL Nebulization Q6H  . levothyroxine  37.5 mcg Intravenous Daily  . metronidazole  500 mg Intravenous Q8H  . pantoprazole (PROTONIX) IV  40 mg Intravenous QHS  . polyethylene glycol  17 g Oral BID  . senna-docusate  1 tablet Oral BID    Infusions:  . dextrose 5 % and 0.9% NaCl 50 mL/hr at 03/27/16 0300  . fentaNYL infusion INTRAVENOUS 400 mcg/hr (03/27/16 0300)  . heparin 1,600 Units/hr (03/27/16 0300)  . midazolam (VERSED) infusion 2 mg/hr (03/27/16 0304)    Assessment: 60 y/o F admitted with respiratory arrest ordered heparin for possible PE.   Goal of Therapy:  Heparin level 0.3-0.7 units/ml Monitor platelets by anticoagulation protocol: Yes   Plan:  Heparin level therapeutic. Continue current rate. Pharmacy will continue to monitor daily.  Laural Benes, Pharm.D., BCPS Clinical Pharmacist 03/27/2016,4:30 AM

## 2016-03-27 NOTE — Progress Notes (Signed)
Pharmacy Antibiotic Note  Tasha Long is a 60 y.o. female admitted on 03/25/2016 with respiratory distress ordered empiric abx.  Pharmacy has been consulted for vancomycin, cefepime, and metronidazole dosing. Patient received Zosyn x 1.   Plan: Vancomycin trough 55 mcg/mL. Looks like it was drawn appropriately and before dose was administered. Vancomycin discontinued/held for now pending repeat trough in 12 hours.  4/16: Vanc level @ 16:00 = 46 mcg/mL .  Will continue to hold vancomycin and recheck vanc level in 12 hrs on 4/17 @ 16:00.   Metronidazole 500 mg iv q 8 hours.   Cefepime 2 g iv q 8 hours starting 6 hours after [employer].   Height: 5\' 5"  (165.1 cm) Weight: (!) 398 lb 2.4 oz (180.6 kg) IBW/kg (Calculated) : 57  Temp (24hrs), Avg:98.5 F (36.9 C), Min:97 F (36.1 C), Max:100 F (37.8 C)   Recent Labs Lab 03/25/16 0719 03/25/16 1303 03/25/16 1529 03/25/16 2228 03/26/16 0535 03/27/16 0352 03/27/16 1626  WBC 17.9*  --   --   --  5.4 7.0  --   CREATININE 1.20*  --  1.59* 1.55* 1.74* 2.23*  --   LATICACIDVEN 4.1* 1.4  --   --   --   --   --   VANCOTROUGH  --   --   --   --   --  55* 46*    Estimated Creatinine Clearance: 46.2 mL/min (by C-G formula based on Cr of 2.23).    Allergies  Allergen Reactions  . Codeine Hives  . Sulfa Antibiotics Hives    Antimicrobials this admission: Zosyn 4/14 >> 4/14 vancomycin 4/14 >>  Cefepime 4/14 >> Metronidazole 4/14 >>  Dose adjustments this admission:   Microbiology results: 4/14 BCx: pending UCx: pending  Sputum: pending   Thank you for allowing pharmacy to be a part of this patient's care.  Laxmi Choung D, Pharm.D. Clinical Pharmacist 03/27/2016 5:27 PM

## 2016-03-27 NOTE — Consult Note (Signed)
Date: 03/27/2016                  Patient Name:  Tasha Long  MRN: [id number]  DOB: Jun 15, 1956  Age / Sex: 60 y.o., female         PCP: Sharyne Peach, MD                 Service Requesting Consult: Internal medicine                 Reason for Consult: Acute renal failure            History of Present Illness: Patient is a 60 y.o. female with medical problems of chronic respiratory failure due to COPD, morbid obesity, obstructive sleep apnea requiring CPAP, chronic severe bilateral lymphedema in the legs, bipolar disorder, anxiety, IBS, GERD, hypertension, hyperlipidemia, hypothyroidism, chronic stasis dermatitis who was admitted to [facility] on 03/25/2016 for evaluation of difficulty breathing. Per ER notes, EMS was delayed due to inability to get patient out of driveway due to stuck vehicle. Upon arrival, blood pressure was 63/36, respirations 35-40, heart rate 120. Per notes, she had complained of feeling cold. She also complained of abdominal pain which was unexplained. She has chronic diarrhea. No nausea or vomiting reported. No fevers.  She was emergently intubated in the emergency room. She briefly lost her pulse and suffered a PE arrest in the ER as well as in the ICU. Per family, patient was recently hospitalized at [facility] for 3 weeks for acute respiratory failure. At present, she is intubated and sedated. All information is obtained from the chart as well as some from family members. Baseline creatinine appears to be 1.20 from 03/25/2016. GFR 49 Patient is anuric. Her serum creatinine has increased to 2.23 today. Nephrology consult has been requested for evaluation. Also noted that her vancomycin trough is 55   Medications: Outpatient medications: No prescriptions prior to admission    Current medications: Current Facility-Administered Medications  Medication Dose Route Frequency Provider Last Rate Last Dose  . 0.9 %  sodium chloride infusion  250 mL Intravenous  PRN Wilhelmina Mcardle, MD      . acetaminophen (TYLENOL) tablet 650 mg  650 mg Per Tube Q4H PRN Wilhelmina Mcardle, MD      . albuterol (PROVENTIL) (2.5 MG/3ML) 0.083% nebulizer solution 2.5 mg  2.5 mg Nebulization Q2H PRN Wilhelmina Mcardle, MD      . antiseptic oral rinse solution (CORINZ)  7 mL Mouth Rinse 10 times per day Wilhelmina Mcardle, MD   7 mL at 03/27/16 1200  . budesonide (PULMICORT) nebulizer solution 0.25 mg  0.25 mg Nebulization 4 times per day Wilhelmina Mcardle, MD   0.25 mg at 03/27/16 0929  . ceFEPIme (MAXIPIME) 2 g in dextrose 5 % 50 mL IVPB  2 g Intravenous 3 times per day Wilhelmina Mcardle, MD   2 g at 03/27/16 0600  . chlorhexidine gluconate (SAGE KIT) (PERIDEX) 0.12 % solution 15 mL  15 mL Mouth Rinse BID Wilhelmina Mcardle, MD   15 mL at 03/27/16 0807  . dextrose 5 %-0.9 % sodium chloride infusion   Intravenous Continuous Wilhelmina Mcardle, MD   Stopped at 03/27/16 1024  . fentaNYL (SUBLIMAZE) injection 100 mcg  100 mcg Intravenous Q1H PRN Mikael Spray, NP   100 mcg at 03/27/16 0605  . fentaNYL (SUBLIMAZE) injection 50 mcg  50 mcg Intravenous Once Wilhelmina Mcardle, MD   50 mcg  at 03/25/16 1900  . fentaNYL 2520mg in NS 2534m(1065mml) infusion-PREMIX  25-400 mcg/hr Intravenous Continuous DavWilhelmina McardleD 40 mL/hr at 03/27/16 1200 400 mcg/hr at 03/27/16 1200  . furosemide (LASIX) 250 mg in dextrose 5 % 250 mL (1 mg/mL) infusion  8 mg/hr Intravenous Continuous PraLaverle HobbyD   Stopped at 03/27/16 0956  . heparin ADULT infusion 100 units/mL (25000 units/250 mL)  1,600 Units/hr Intravenous Continuous PraLaverle HobbyD 29 mL/hr at 03/27/16 1200 16 Units/kg/hr at 03/27/16 1200  . insulin aspart (novoLOG) injection 0-15 Units  0-15 Units Subcutaneous 6 times per day DavWilhelmina McardleD   2 Units at 03/26/16 0057  . ipratropium-albuterol (DUONEB) 0.5-2.5 (3) MG/3ML nebulizer solution 3 mL  3 mL Nebulization Q6H DavWilhelmina McardleD   3 mL at 03/27/16 0928  . levothyroxine  (SYNTHROID, LEVOTHROID) injection 37.5 mcg  37.5 mcg Intravenous Daily DavWilhelmina McardleD   37.5 mcg at 03/27/16 0956  . metroNIDAZOLE (FLAGYL) IVPB 500 mg  500 mg Intravenous Q8H DavWilhelmina McardleD   500 mg at 03/27/16 0600  . midazolam (VERSED) 50 mg in sodium chloride 0.9 % 50 mL (1 mg/mL) infusion  0-10 mg/hr Intravenous Continuous MagMikael SprayP 6 mL/hr at 03/27/16 1200 6 mg/hr at 03/27/16 1200  . midazolam (VERSED) bolus via infusion 2-4 mg  2-4 mg Intravenous Q2H PRN MagMikael SprayP   2 mg at 03/27/16 0605  . norepinephrine (LEVOPHED) 4mg82m D5W 250mL67mmix infusion  0-40 mcg/min Intravenous Titrated DavidWilhelmina Mcardle7.5 mL/hr at 03/27/16 1200 2 mcg/min at 03/27/16 1200  . ondansetron (ZOFRAN) injection 4 mg  4 mg Intravenous Q6H PRN DavidWilhelmina Mcardle     . pantoprazole (PROTONIX) injection 40 mg  40 mg Intravenous QHS DavidWilhelmina Mcardle  40 mg at 03/26/16 2143  . polyethylene glycol (MIRALAX / GLYCOLAX) packet 17 g  17 g Oral BID PradeLaverle Hobby  17 g at 03/27/16 0956  . senna-docusate (Senokot-S) tablet 1 tablet  1 tablet Oral BID PradeLaverle Hobby  1 tablet at 03/27/16 0956      Allergies: Allergies  Allergen Reactions  . Codeine Hives  . Sulfa Antibiotics Hives      Past Medical History: History reviewed. No pertinent past medical history.   Past Surgical History: History reviewed. No pertinent past surgical history.   Family History: History reviewed. No pertinent family history.   Social History: Social History   Social History  . Marital Status: Married    Spouse Name: N/A  . Number of Children: N/A  . Years of Education: N/A   Occupational History  . Not on file.   Social History Main Topics  . Smoking status: Unknown If Ever Smoked  . Smokeless tobacco: Not on file  . Alcohol Use: Not on file  . Drug Use: Not on file  . Sexual Activity: Not on file   Other Topics Concern  . Not on file   Social History  Narrative  . No narrative on file     Review of Systems:not available due to patient being intubated and sedated Gen:  HEENT:  CV:  Resp:  GI: GU :  MS:  Derm:   Psych: Heme:  Neuro:  Endocrine  Vital Signs: Blood pressure 110/44, pulse 75, temperature 98.1 F (36.7 C), temperature source Other (Comment), resp. rate 26, height 5' 5"  (1.651 m), weight 180.6 kg (398  lb 2.4 oz), SpO2 98 %.   Intake/Output Summary (Last 24 hours) at 03/27/16 1339 Last data filed at 03/27/16 1200  Gross per 24 hour  Intake 3580.72 ml  Output    535 ml  Net 3045.72 ml    Weight trends: Filed Weights   03/25/16 1000 03/26/16 0500 03/27/16 0541  Weight: 174 kg (383 lb 9.6 oz) 176.4 kg (388 lb 14.3 oz) 180.6 kg (398 lb 2.4 oz)    Physical Exam: General:  morbidly obese lady, critically ill-appearing  HEENT ET tube, OG tube  Neck:  no masses  Lungs: Ventilator assisted, coarse breath sounds anteriorly and laterally  Heart::  tachycardic, no rub or gallop  Abdomen: Obese, soft, nondistended  Extremities: massive lymphedema bilaterally with stasis dermatitis  Neurologic: sedated  Skin: Stasis dermatitis of her lower legs     Foley: present       Lab results: Basic Metabolic Panel:  Recent Labs Lab 03/25/16 2228 03/26/16 0535 03/27/16 0352  NA 134* 133* 134*  K 4.2 4.0 4.1  CL 99* 99* 101  CO2 28 30 27   GLUCOSE 97 86 90  BUN 22* 22* 24*  CREATININE 1.55* 1.74* 2.23*  CALCIUM 8.4* 8.3* 7.9*  MG  --   --  2.1  PHOS  --  4.6 5.5*    Liver Function Tests:  Recent Labs Lab 03/25/16 2228  AST 30  ALT 12*  ALKPHOS 31*  BILITOT 0.6  PROT 6.3*  ALBUMIN 2.6*    Recent Labs Lab 03/25/16 0719  LIPASE 18   No results for input(s): AMMONIA in the last 168 hours.  CBC:  Recent Labs Lab 03/25/16 0719 03/26/16 0535 03/27/16 0352  WBC 17.9* 5.4 7.0  NEUTROABS 8.2*  --   --   HGB 9.8* 8.4* 8.2*  HCT 32.8* 27.0* 26.7*  MCV 76.6* 75.2* 76.2*  PLT 320 195 192     Cardiac Enzymes:  Recent Labs Lab 03/25/16 2228  TROPONINI 0.42*    BNP: Invalid input(s): POCBNP  CBG:  Recent Labs Lab 03/27/16 0006 03/27/16 0726 03/27/16 0729 03/27/16 1137 03/27/16 1146  GLUCAP 86 63* 86 66 70    Microbiology: Recent Results (from the past 720 hour(s))  Culture, blood (routine x 2)     Status: None (Preliminary result)   Collection Time: 03/25/16  7:19 AM  Result Value Ref Range Status   Specimen Description BLOOD LEFT FOREARM  Final   Special Requests BOTTLES DRAWN AEROBIC AND ANAEROBIC  1CC  Final   Culture NO GROWTH 2 DAYS  Final   Report Status PENDING  Incomplete  Culture, blood (routine x 2)     Status: None (Preliminary result)   Collection Time: 03/25/16  7:19 AM  Result Value Ref Range Status   Specimen Description BLOOD LEFT HAND  Final   Special Requests BOTTLES DRAWN AEROBIC AND ANAEROBIC  1CC  Final   Culture NO GROWTH 2 DAYS  Final   Report Status PENDING  Incomplete  MRSA PCR Screening     Status: Abnormal   Collection Time: 03/25/16 12:42 PM  Result Value Ref Range Status   MRSA by PCR POSITIVE (A) NEGATIVE Final    Comment:        The GeneXpert MRSA Assay (FDA approved for NASAL specimens only), is one component of a comprehensive MRSA colonization surveillance program. It is not intended to diagnose MRSA infection nor to guide or monitor treatment for MRSA infections. CRITICAL RESULT CALLED TO, READ BACK BY AND  VERIFIED WITH:  AMELIA BERRY AT 1644 03/25/16 SDR   Urine culture     Status: Abnormal (Preliminary result)   Collection Time: 03/26/16  1:02 AM  Result Value Ref Range Status   Specimen Description URINE, RANDOM  Final   Special Requests NONE  Final   Culture (A)  Final    20,000 COLONIES/mL ESCHERICHIA COLI SUSCEPTIBILITIES TO FOLLOW    Report Status PENDING  Incomplete     Coagulation Studies:  Recent Labs  03/25/16 0719 03/25/16 1303  LABPROT SPECIMEN CLOTTED 16.9*  INR SPECIMEN CLOTTED  1.36    Urinalysis:  Recent Labs  03/25/16 0745 03/26/16 0102  COLORURINE YELLOW* YELLOW*  LABSPEC 1.020 1.014  PHURINE 5.0 5.0  GLUCOSEU NEGATIVE NEGATIVE  HGBUR NEGATIVE NEGATIVE  BILIRUBINUR NEGATIVE NEGATIVE  KETONESUR NEGATIVE TRACE*  PROTEINUR 100* 100*  NITRITE POSITIVE* NEGATIVE  LEUKOCYTESUR NEGATIVE NEGATIVE        Imaging: Ct Abdomen Pelvis Wo Contrast  03/26/2016  CLINICAL DATA:  60 year old female with history of abdominal pain. Respiratory arrest and brief cardiac arrest. EXAM: CT CHEST, ABDOMEN AND PELVIS WITHOUT CONTRAST TECHNIQUE: Multidetector CT imaging of the chest, abdomen and pelvis was performed following the standard protocol without IV contrast. COMPARISON:  No priors. FINDINGS: CT CHEST FINDINGS Mediastinum/Lymph Nodes: Heart size is mildly enlarged. There is no significant pericardial fluid, thickening or pericardial calcification. Mild dilatation of the pulmonic trunk (3.5 cm in diameter). Multiple borderline enlarged and mildly enlarged mediastinal and hilar lymph nodes measuring up to 12 mm in short axis in the low right paratracheal nodal station. Small hiatal hernia. Near complete collapse of the distal trachea and main bronchi on expiratory phase imaging, indicative of severe tracheobronchomalacia. No axillary lymphadenopathy. Lungs/Pleura: Diffuse bronchial wall thickening with diffuse centrilobular ground-glass attenuation micro and macronodularity throughout all aspects of the lungs bilaterally, forming into near confluent opacities in the right lower lobe, most compatible with severe multilobar bronchopneumonia. No pleural effusions. Musculoskeletal/Soft Tissues: There are no aggressive appearing lytic or blastic lesions noted in the visualized portions of the skeleton. CT ABDOMEN AND PELVIS FINDINGS Hepatobiliary: Small calcified granuloma in the right lobe of the liver. No definite cystic or solid hepatic lesions are identified within the liver on  today's noncontrast CT examination. Gallbladder is not visualized, presumably surgically absent (no surgical clips are noted in the gallbladder fossa). Pancreas: No definite pancreatic mass or peripancreatic inflammatory changes on today's noncontrast CT examination. Spleen: Small calcified granulomas in the spleen. Adrenals/Urinary Tract: Unenhanced appearance of the adrenal glands and kidneys bilaterally is normal. No hydroureteronephrosis. Urinary bladder is nearly completely decompressed around an indwelling Foley catheter. Stomach/Bowel: Unenhanced appearance of the stomach is normal. There is no pathologic dilatation of small bowel or colon. The appendix is not confidently identified may be surgically absent. Regardless, there are no inflammatory changes noted adjacent to the cecum to suggest the presence of an acute appendicitis at this time. Vascular/Lymphatic: Mild atherosclerotic calcifications throughout the abdominal and pelvic vasculature, without definite aneurysm. No lymphadenopathy noted in the abdomen or pelvis. Reproductive: Status post hysterectomy. Ovaries are not confidently identified may be surgically absent or atrophic. Other: Trace volume of ascites.  No pneumoperitoneum. Musculoskeletal: There are no aggressive appearing lytic or blastic lesions noted in the visualized portions of the skeleton. IMPRESSION: 1. Severe multilobar bronchopneumonia throughout the lungs bilaterally. 2. Multiple borderline enlarged and minimally enlarged mediastinal and bilateral hilar lymph nodes, presumably reactive. 3. Mild dilatation of the pulmonic trunk (3.5 cm in diameter), suggestive of pulmonary  arterial hypertension. 4. Mild cardiomegaly. 5. Atherosclerosis. 6. Additional incidental findings, as above. Electronically Signed   By: Vinnie Langton M.D.   On: 03/26/2016 14:56   Dg Chest 1 View  03/27/2016  CLINICAL DATA:  60 year old female with a history of respiratory failure. EXAM: CHEST 1 VIEW  COMPARISON:  CT 03/26/2016, chest x-ray 03/26/2016, 03/25/2016 FINDINGS: Cardiomediastinal silhouette unchanged. Multifocal bilateral airspace disease, better characterized on prior CT. No large pleural effusion.  No visualized pneumothorax. Endotracheal tube remains in position, terminating approximately 3.5 cm above the carina. Right subclavian central venous catheter appearing to terminate in the superior vena cava. Overlying EKG leads. Interval removal of the defibrillator pads Gastric tube projects over the mediastinum and terminates out of the field of view. IMPRESSION: Similar appearance of multifocal pneumonia with no large pleural effusion. Interval removal of defibrillator pads, with otherwise unchanged support apparatus. Signed, Dulcy Fanny. Earleen Newport, DO [facility] Radiology Electronically Signed   By: Corrie Mckusick D.O.   On: 03/27/2016 08:23   Dg Chest 1 View  03/26/2016  CLINICAL DATA:  Dyspnea. EXAM: CHEST 1 VIEW COMPARISON:  03/25/2016. FINDINGS: Endotracheal tube in satisfactory position. Right jugular catheter tip in the proximal superior vena cava. The nasogastric tube is poorly visualized inferiorly. Stable enlarged cardiac silhouette and prominent interstitial markings. There is a persistent rounded density the in the inferior perihilar region on the right, measuring approximately 4.3 cm in maximum diameter. IMPRESSION: 1. Stable mass or rounded area of atelectasis or pneumonia in the right inferior perihilar region. 2. Stable cardiomegaly and chronic interstitial lung disease. Electronically Signed   By: Claudie Revering M.D.   On: 03/26/2016 07:37   Dg Abd 1 View  03/26/2016  CLINICAL DATA:  60 year old female with abdominal ileus EXAM: ABDOMEN - 1 VIEW COMPARISON:  Chest x-ray obtained earlier today FINDINGS: Limited study secondary to patient body habitus. A total of 3 radiographs were obtained in an effort to encompass the entirety of the  abdomen. The tip of the nasogastric tube overlies the upper stomach just within the gastroesophageal junction. The bowel gas pattern does not appear obstructed. The lung bases are clear. IMPRESSION: 1. The tip of the nasogastric tube projects over the gastroesophageal junction. Consider advancing 5 cm for more optimal placement. 2. Unremarkable bowel gas pattern given limitations of the study related to patient body habitus. Electronically Signed   By: Jacqulynn Cadet M.D.   On: 03/26/2016 09:12   Ct Chest Wo Contrast  03/26/2016  CLINICAL DATA:  60 year old female with history of abdominal pain. Respiratory arrest and brief cardiac arrest. EXAM: CT CHEST, ABDOMEN AND PELVIS WITHOUT CONTRAST TECHNIQUE: Multidetector CT imaging of the chest, abdomen and pelvis was performed following the standard protocol without IV contrast. COMPARISON:  No priors. FINDINGS: CT CHEST FINDINGS Mediastinum/Lymph Nodes: Heart size is mildly enlarged. There is no significant pericardial fluid, thickening or pericardial calcification. Mild dilatation of the pulmonic trunk (3.5 cm in diameter). Multiple borderline enlarged and mildly enlarged mediastinal and hilar lymph nodes measuring up to 12 mm in short axis in the low right paratracheal nodal station. Small hiatal hernia. Near complete collapse of the distal trachea and main bronchi on expiratory phase imaging, indicative of severe tracheobronchomalacia. No axillary lymphadenopathy. Lungs/Pleura: Diffuse bronchial wall thickening with diffuse centrilobular ground-glass attenuation micro and macronodularity throughout all aspects of the lungs bilaterally, forming into near confluent opacities in the right lower lobe, most compatible with severe multilobar bronchopneumonia. No pleural effusions. Musculoskeletal/Soft Tissues: There  are no aggressive appearing lytic or blastic lesions noted in the visualized portions of the skeleton. CT ABDOMEN AND PELVIS FINDINGS Hepatobiliary:  Small calcified granuloma in the right lobe of the liver. No definite cystic or solid hepatic lesions are identified within the liver on today's noncontrast CT examination. Gallbladder is not visualized, presumably surgically absent (no surgical clips are noted in the gallbladder fossa). Pancreas: No definite pancreatic mass or peripancreatic inflammatory changes on today's noncontrast CT examination. Spleen: Small calcified granulomas in the spleen. Adrenals/Urinary Tract: Unenhanced appearance of the adrenal glands and kidneys bilaterally is normal. No hydroureteronephrosis. Urinary bladder is nearly completely decompressed around an indwelling Foley catheter. Stomach/Bowel: Unenhanced appearance of the stomach is normal. There is no pathologic dilatation of small bowel or colon. The appendix is not confidently identified may be surgically absent. Regardless, there are no inflammatory changes noted adjacent to the cecum to suggest the presence of an acute appendicitis at this time. Vascular/Lymphatic: Mild atherosclerotic calcifications throughout the abdominal and pelvic vasculature, without definite aneurysm. No lymphadenopathy noted in the abdomen or pelvis. Reproductive: Status post hysterectomy. Ovaries are not confidently identified may be surgically absent or atrophic. Other: Trace volume of ascites.  No pneumoperitoneum. Musculoskeletal: There are no aggressive appearing lytic or blastic lesions noted in the visualized portions of the skeleton. IMPRESSION: 1. Severe multilobar bronchopneumonia throughout the lungs bilaterally. 2. Multiple borderline enlarged and minimally enlarged mediastinal and bilateral hilar lymph nodes, presumably reactive. 3. Mild dilatation of the pulmonic trunk (3.5 cm in diameter), suggestive of pulmonary arterial hypertension. 4. Mild cardiomegaly. 5. Atherosclerosis. 6. Additional incidental findings, as above. Electronically Signed   By: Vinnie Langton M.D.   On: 03/26/2016  14:56   US Venous Img Lower Bilateral  03/25/2016  CLINICAL DATA:  Bilateral lower extremity edema, morbid obesity EXAM: BILATERAL LOWER EXTREMITY VENOUS DOPPLER ULTRASOUND TECHNIQUE: Gray-scale sonography with graded compression, as well as color Doppler and duplex ultrasound were performed to evaluate the lower extremity deep venous systems from the level of the common femoral vein and including the common femoral, femoral, profunda femoral, popliteal and calf veins including the posterior tibial, peroneal and gastrocnemius veins when visible. The superficial great saphenous vein was also interrogated. Spectral Doppler was utilized to evaluate flow at rest and with distal augmentation maneuvers in the common femoral, femoral and popliteal veins. COMPARISON:  None. FINDINGS: Limited exam because of body habitus and condition. RIGHT LOWER EXTREMITY Common Femoral Vein: No evidence of thrombus. Normal compressibility, respiratory phasicity and response to augmentation. Saphenofemoral Junction: No evidence of thrombus. Normal compressibility and flow on color Doppler imaging. Profunda Femoral Vein: No evidence of thrombus. Normal compressibility and flow on color Doppler imaging. Femoral Vein: No evidence of thrombus. Normal compressibility, respiratory phasicity and response to augmentation. Popliteal Vein: No evidence of thrombus. Normal compressibility, respiratory phasicity and response to augmentation. Calf Veins: Very limited assessment, difficult to exclude small calf thrombus. Superficial Great Saphenous Vein: No evidence of thrombus. Normal compressibility and flow on color Doppler imaging. Venous Reflux:  None. Other Findings:  Peripheral subcutaneous edema noted. LEFT LOWER EXTREMITY Common Femoral Vein: No evidence of thrombus. Normal compressibility, respiratory phasicity and response to augmentation. Saphenofemoral Junction: No evidence of thrombus. Normal compressibility and flow on color Doppler  imaging. Profunda Femoral Vein: No evidence of thrombus. Normal compressibility and flow on color Doppler imaging. Femoral Vein: No evidence of thrombus. Normal compressibility, respiratory phasicity and response to augmentation. Popliteal Vein: No evidence of thrombus. Normal compressibility, respiratory phasicity and response  to augmentation. Calf Veins: Very limited assessment, it difficult to exclude small calf thrombus. Superficial Great Saphenous Vein: No evidence of thrombus. Normal compressibility and flow on color Doppler imaging. Venous Reflux:  None. Other Findings:  Peripheral edema noted. IMPRESSION: Limited exam because of body habitus. No significant occlusive femoral popliteal DVT demonstrated in either extremity. Limited assessment of the calf veins. Electronically Signed   By: Jerilynn Mages.  Shick M.D.   On: 03/25/2016 16:16      Assessment & Plan: Pt is a 60 y.o. yo female with a PMHX of chronic respiratory failure due to COPD, morbid obesity, obstructive sleep apnea requiring CPAP, chronic severe bilateral lymphedema in the legs, bipolar disorder, anxiety, IBS, GERD, hypertension, hyperlipidemia, hypothyroidism, chronic stasis dermatitis , was admitted on 03/25/2016 with Acute respiratory distress.   1. Acute renal failure, oliguric. Likely secondary to ATN 2. Chronic kidney disease stage III. Baseline creatinine 1.2/GFR 49 from 03/25/2016 3. Acute respiratory failure 4. Morbid obesity with massive lymphedema of lower legs bilaterally 5. High vancomycin trough  Plan: CVP measurements every 6 hours. Most recent level was 16. Recommend discontinuing IV normal saline. Goal CVP 10-12 Discontinue Lasix drip rate may consider restarting once patient's urine output improves to about 25-30 cc per hour Dose medications for creatinine clearance less than 10 since patient is anuric Volume status and electrolytes are acceptable. No acute indication for dialysis at present. However, discussed with family  that she may need temporary dialysis in near future if problems arise from volume status or electrolytes.

## 2016-03-27 NOTE — Progress Notes (Signed)
Pharmacy Antibiotic Note  Tasha Long is a 60 y.o. female admitted on 03/25/2016 with respiratory distress ordered empiric abx.  Pharmacy has been consulted for vancomycin, cefepime, and metronidazole dosing. Patient received Zosyn x 1.   Plan: Vancomycin trough 55 mcg/mL. Looks like it was drawn appropriately and before dose was administered. Vancomycin discontinued/held for now pending repeat trough in 12 hours.  Metronidazole 500 mg iv q 8 hours.   Cefepime 2 g iv q 8 hours starting 6 hours after [employer].   Height: 5\' 5"  (165.1 cm) Weight: (!) 388 lb 14.3 oz (176.4 kg) IBW/kg (Calculated) : 57  Temp (24hrs), Avg:98.3 F (36.8 C), Min:97 F (36.1 C), Max:100 F (37.8 C)   Recent Labs Lab 03/25/16 0719 03/25/16 1303 03/25/16 1529 03/25/16 2228 03/26/16 0535 03/27/16 0352  WBC 17.9*  --   --   --  5.4 7.0  CREATININE 1.20*  --  1.59* 1.55* 1.74* 2.23*  LATICACIDVEN 4.1* 1.4  --   --   --   --     Estimated Creatinine Clearance: 45.5 mL/min (by C-G formula based on Cr of 2.23).    Allergies  Allergen Reactions  . Codeine Hives  . Sulfa Antibiotics Hives    Antimicrobials this admission: Zosyn 4/14 >> 4/14 vancomycin 4/14 >>  Cefepime 4/14 >> Metronidazole 4/14 >>  Dose adjustments this admission:   Microbiology results: 4/14 BCx: pending UCx: pending  Sputum: pending   Thank you for allowing pharmacy to be a part of this patient's care.  Laural Benes, Pharm.D., BCPS Clinical Pharmacist 03/27/2016 4:28 AM

## 2016-03-27 NOTE — Progress Notes (Signed)
Pt resting comfortably on vent.  Switched from propofol to fentanyl infusion for sedation.  Versed pushes added for increased agitation.  Despite being maxed out on fentanyl and being given versed pushes, pt was dyssynchronous with the ventilator.  10mg  vecuronium given once to help patient rest better and for vent synchrony.  Versed drip started for maintaining adequate sedation.  BP trending downward and Magdelene, NP made aware.  Albumin ordered and given.  UOP 149ml this shift.  Report given to oncoming RN.

## 2016-03-28 ENCOUNTER — Inpatient Hospital Stay: Payer: Medicaid Other

## 2016-03-28 LAB — COMPREHENSIVE METABOLIC PANEL
ALT: 11 U/L — AB (ref 14–54)
AST: 29 U/L (ref 15–41)
Albumin: 2.4 g/dL — ABNORMAL LOW (ref 3.5–5.0)
Alkaline Phosphatase: 30 U/L — ABNORMAL LOW (ref 38–126)
Anion gap: 8 (ref 5–15)
BILIRUBIN TOTAL: 1 mg/dL (ref 0.3–1.2)
BUN: 29 mg/dL — ABNORMAL HIGH (ref 6–20)
CHLORIDE: 102 mmol/L (ref 101–111)
CO2: 23 mmol/L (ref 22–32)
CREATININE: 2.92 mg/dL — AB (ref 0.44–1.00)
Calcium: 8 mg/dL — ABNORMAL LOW (ref 8.9–10.3)
GFR, EST AFRICAN AMERICAN: 19 mL/min — AB (ref >60)
GFR, EST NON AFRICAN AMERICAN: 17 mL/min — AB (ref >60)
Glucose, Bld: 79 mg/dL (ref 65–99)
POTASSIUM: 4.1 mmol/L (ref 3.5–5.1)
Sodium: 133 mmol/L — ABNORMAL LOW (ref 135–145)
Total Protein: 6.3 g/dL — ABNORMAL LOW (ref 6.5–8.1)

## 2016-03-28 LAB — CBC
HCT: 25.7 % — ABNORMAL LOW (ref 35.0–47.0)
Hemoglobin: 8.1 g/dL — ABNORMAL LOW (ref 12.0–16.0)
MCH: 23.5 pg — AB (ref 26.0–34.0)
MCHC: 31.3 g/dL — ABNORMAL LOW (ref 32.0–36.0)
MCV: 75 fL — AB (ref 80.0–100.0)
PLATELETS: 188 10*3/uL (ref 150–440)
RBC: 3.43 MIL/uL — ABNORMAL LOW (ref 3.80–5.20)
RDW: 19.1 % — AB (ref 11.5–14.5)
WBC: 5.4 10*3/uL (ref 3.6–11.0)

## 2016-03-28 LAB — GLUCOSE, CAPILLARY
GLUCOSE-CAPILLARY: 70 mg/dL (ref 65–99)
GLUCOSE-CAPILLARY: 70 mg/dL (ref 65–99)
GLUCOSE-CAPILLARY: 77 mg/dL (ref 65–99)
GLUCOSE-CAPILLARY: 86 mg/dL (ref 65–99)
Glucose-Capillary: 69 mg/dL (ref 65–99)
Glucose-Capillary: 91 mg/dL (ref 65–99)

## 2016-03-28 LAB — VANCOMYCIN, RANDOM: Vancomycin Rm: 45 ug/mL

## 2016-03-28 LAB — URINE CULTURE: Culture: 20000 — AB

## 2016-03-28 LAB — BLOOD GAS, ARTERIAL
Acid-Base Excess: 0 mmol/L (ref 0.0–3.0)
BICARBONATE: 24.8 meq/L (ref 21.0–28.0)
FIO2: 0.4
LHR: 26 {breaths}/min
Mechanical Rate: 26
O2 SAT: 97.2 %
PATIENT TEMPERATURE: 37
PCO2 ART: 40 mmHg (ref 32.0–48.0)
PEEP/CPAP: 5 cmH2O
PH ART: 7.4 (ref 7.350–7.450)
PRESSURE CONTROL: 40 cmH2O
pO2, Arterial: 93 mmHg (ref 83.0–108.0)

## 2016-03-28 LAB — HEPARIN LEVEL (UNFRACTIONATED)
HEPARIN UNFRACTIONATED: 0.29 [IU]/mL — AB (ref 0.30–0.70)
Heparin Unfractionated: 0.27 IU/mL — ABNORMAL LOW (ref 0.30–0.70)
Heparin Unfractionated: 0.56 IU/mL (ref 0.30–0.70)

## 2016-03-28 MED ORDER — STERILE WATER FOR INJECTION IJ SOLN
INTRAMUSCULAR | Status: AC
  Administered 2016-03-28: 10 mL
  Filled 2016-03-28: qty 10

## 2016-03-28 MED ORDER — HEPARIN BOLUS VIA INFUSION
1500.0000 [IU] | Freq: Once | INTRAVENOUS | Status: AC
  Administered 2016-03-28: 1500 [IU] via INTRAVENOUS
  Filled 2016-03-28: qty 1500

## 2016-03-28 MED ORDER — VECURONIUM BROMIDE 10 MG IV SOLR
INTRAVENOUS | Status: AC
  Administered 2016-03-28: 16:00:00
  Filled 2016-03-28: qty 10

## 2016-03-28 MED ORDER — SODIUM CHLORIDE 0.9% FLUSH
10.0000 mL | INTRAVENOUS | Status: DC | PRN
Start: 2016-03-28 — End: 2016-04-15

## 2016-03-28 MED ORDER — DEXTROSE 5 % IV SOLN
2.0000 g | Freq: Two times a day (BID) | INTRAVENOUS | Status: DC
  Filled 2016-03-28: qty 2

## 2016-03-28 MED ORDER — SODIUM CHLORIDE 0.9 % IV SOLN
1.0000 g | Freq: Two times a day (BID) | INTRAVENOUS | Status: DC
  Administered 2016-03-28 – 2016-03-29 (×3): 1 g via INTRAVENOUS
  Filled 2016-03-28 (×5): qty 1

## 2016-03-28 MED ORDER — DEXTROSE 50 % IV SOLN
INTRAVENOUS | Status: AC
  Filled 2016-03-28: qty 50

## 2016-03-28 MED FILL — Medication: Qty: 1 | Status: AC

## 2016-03-28 NOTE — Progress Notes (Signed)
[facility]  ROUNDING NOTE   Subjective:   Adopted daughter is at bedside.  UOP 90  Creatinine 2.99 (2.23)   Furosemide gtt  Norepinephrine gtt   Objective:  Vital signs in last 24 hours:  Temp:  [97 F (36.1 C)-99.5 F (37.5 C)] 99.3 F (37.4 C) (04/17 0854) Pulse Rate:  [63-87] 86 (04/17 0854) Resp:  [17-28] 26 (04/17 0854) BP: (84-138)/(33-52) 118/46 mmHg (04/17 0830) SpO2:  [84 %-100 %] 97 % (04/17 0854) FiO2 (%):  [40 %] 40 % (04/17 0800) Weight:  [183 kg (403 lb 7.1 oz)] 183 kg (403 lb 7.1 oz) (04/17 0500)  Weight change: 2.4 kg (5 lb 4.7 oz) Filed Weights   03/26/16 0500 03/27/16 0541 03/28/16 0500  Weight: 176.4 kg (388 lb 14.3 oz) 180.6 kg (398 lb 2.4 oz) 183 kg (403 lb 7.1 oz)    Intake/Output: I/O last 3 completed shifts: In: 4763.6 [I.V.:3363.6; NG/GT:450; IV Piggyback:950] Out: 500 [Urine:225; Emesis/NG output:275]   Intake/Output this shift:  Total I/O In: 91.3 [I.V.:91.3] Out: 0   Physical Exam: General: Critically ill  Head: , ETT OGT  Eyes: Eyes closed  Neck: trachea midline  Lungs:  Coarse breath sounds, PRVC FiO2 40%  Heart: Regular rate and rhythm  Abdomen:  Soft, nontender, obese, no bowel soundss  Extremities: nonpitting peripheral edema.  Neurologic: Sedated, intubated  Skin: Erythema bilateral lower extremities  Access: none    Basic Metabolic Panel:  Recent Labs Lab 03/25/16 1529 03/25/16 2228 03/26/16 0535 03/27/16 0352 03/28/16 0349  NA 135 134* 133* 134* 133*  K 4.3 4.2 4.0 4.1 4.1  CL 100* 99* 99* 101 102  CO2 30 28 30 27 23   GLUCOSE 100* 97 86 90 79  BUN 20 22* 22* 24* 29*  CREATININE 1.59* 1.55* 1.74* 2.23* 2.92*  CALCIUM 8.7* 8.4* 8.3* 7.9* 8.0*  MG  --   --   --  2.1  --   PHOS  --   --  4.6 5.5*  --     Liver Function Tests:  Recent Labs Lab 03/25/16 0719 03/25/16 2228 03/28/16 0349  AST 30 30 29   ALT 10* 12* 11*  ALKPHOS 45 31* 30*  BILITOT 0.4 0.6 1.0  PROT 8.2* 6.3* 6.3*   ALBUMIN 3.5 2.6* 2.4*    Recent Labs Lab 03/25/16 0719  LIPASE 18   No results for input(s): AMMONIA in the last 168 hours.  CBC:  Recent Labs Lab 03/25/16 0719 03/26/16 0535 03/27/16 0352 03/28/16 0349  WBC 17.9* 5.4 7.0 5.4  NEUTROABS 8.2*  --   --   --   HGB 9.8* 8.4* 8.2* 8.1*  HCT 32.8* 27.0* 26.7* 25.7*  MCV 76.6* 75.2* 76.2* 75.0*  PLT 320 195 192 188    Cardiac Enzymes:  Recent Labs Lab 03/25/16 0719 03/25/16 1529 03/25/16 2228  TROPONINI 0.56* 0.81* 0.42*    BNP: Invalid input(s): POCBNP  CBG:  Recent Labs Lab 03/27/16 2002 03/27/16 2025 03/28/16 03/28/16 0354 03/28/16 0729  GLUCAP 69 99 86 70 36    Microbiology: Results for orders placed or performed during the hospital encounter of 03/25/16  Culture, blood (routine x 2)     Status: None (Preliminary result)   Collection Time: 03/25/16  7:19 AM  Result Value Ref Range Status   Specimen Description BLOOD LEFT FOREARM  Final   Special Requests BOTTLES DRAWN AEROBIC AND ANAEROBIC  1CC  Final   Culture NO GROWTH 2 DAYS  Final   Report  Status PENDING  Incomplete  Culture, blood (routine x 2)     Status: None (Preliminary result)   Collection Time: 03/25/16  7:19 AM  Result Value Ref Range Status   Specimen Description BLOOD LEFT HAND  Final   Special Requests BOTTLES DRAWN AEROBIC AND ANAEROBIC  1CC  Final   Culture NO GROWTH 2 DAYS  Final   Report Status PENDING  Incomplete  MRSA PCR Screening     Status: Abnormal   Collection Time: 03/25/16 12:42 PM  Result Value Ref Range Status   MRSA by PCR POSITIVE (A) NEGATIVE Final    Comment:        The GeneXpert MRSA Assay (FDA approved for NASAL specimens only), is one component of a comprehensive MRSA colonization surveillance program. It is not intended to diagnose MRSA infection nor to guide or monitor treatment for MRSA infections. CRITICAL RESULT CALLED TO, READ BACK BY AND VERIFIED WITH:  AMELIA BERRY AT 1644 03/25/16 SDR    Urine culture     Status: Abnormal (Preliminary result)   Collection Time: 03/26/16  1:02 AM  Result Value Ref Range Status   Specimen Description URINE, RANDOM  Final   Special Requests NONE  Final   Culture (A)  Final    20,000 COLONIES/mL ESCHERICHIA COLI SUSCEPTIBILITIES TO FOLLOW    Report Status PENDING  Incomplete    Coagulation Studies:  Recent Labs  03/25/16 1303  LABPROT 16.9*  INR 1.36    Urinalysis:  Recent Labs  03/26/16 0102  COLORURINE YELLOW*  LABSPEC 1.014  PHURINE 5.0  GLUCOSEU NEGATIVE  HGBUR NEGATIVE  BILIRUBINUR NEGATIVE  KETONESUR TRACE*  PROTEINUR 100*  NITRITE NEGATIVE  LEUKOCYTESUR NEGATIVE      Imaging: Ct Abdomen Pelvis Wo Contrast  03/26/2016  CLINICAL DATA:  60 year old female with history of abdominal pain. Respiratory arrest and brief cardiac arrest. EXAM: CT CHEST, ABDOMEN AND PELVIS WITHOUT CONTRAST TECHNIQUE: Multidetector CT imaging of the chest, abdomen and pelvis was performed following the standard protocol without IV contrast. COMPARISON:  No priors. FINDINGS: CT CHEST FINDINGS Mediastinum/Lymph Nodes: Heart size is mildly enlarged. There is no significant pericardial fluid, thickening or pericardial calcification. Mild dilatation of the pulmonic trunk (3.5 cm in diameter). Multiple borderline enlarged and mildly enlarged mediastinal and hilar lymph nodes measuring up to 12 mm in short axis in the low right paratracheal nodal station. Small hiatal hernia. Near complete collapse of the distal trachea and main bronchi on expiratory phase imaging, indicative of severe tracheobronchomalacia. No axillary lymphadenopathy. Lungs/Pleura: Diffuse bronchial wall thickening with diffuse centrilobular ground-glass attenuation micro and macronodularity throughout all aspects of the lungs bilaterally, forming into near confluent opacities in the right lower lobe, most compatible with severe multilobar bronchopneumonia. No pleural effusions.  Musculoskeletal/Soft Tissues: There are no aggressive appearing lytic or blastic lesions noted in the visualized portions of the skeleton. CT ABDOMEN AND PELVIS FINDINGS Hepatobiliary: Small calcified granuloma in the right lobe of the liver. No definite cystic or solid hepatic lesions are identified within the liver on today's noncontrast CT examination. Gallbladder is not visualized, presumably surgically absent (no surgical clips are noted in the gallbladder fossa). Pancreas: No definite pancreatic mass or peripancreatic inflammatory changes on today's noncontrast CT examination. Spleen: Small calcified granulomas in the spleen. Adrenals/Urinary Tract: Unenhanced appearance of the adrenal glands and kidneys bilaterally is normal. No hydroureteronephrosis. Urinary bladder is nearly completely decompressed around an indwelling Foley catheter. Stomach/Bowel: Unenhanced appearance of the stomach is normal. There is no pathologic dilatation  of small bowel or colon. The appendix is not confidently identified may be surgically absent. Regardless, there are no inflammatory changes noted adjacent to the cecum to suggest the presence of an acute appendicitis at this time. Vascular/Lymphatic: Mild atherosclerotic calcifications throughout the abdominal and pelvic vasculature, without definite aneurysm. No lymphadenopathy noted in the abdomen or pelvis. Reproductive: Status post hysterectomy. Ovaries are not confidently identified may be surgically absent or atrophic. Other: Trace volume of ascites.  No pneumoperitoneum. Musculoskeletal: There are no aggressive appearing lytic or blastic lesions noted in the visualized portions of the skeleton. IMPRESSION: 1. Severe multilobar bronchopneumonia throughout the lungs bilaterally. 2. Multiple borderline enlarged and minimally enlarged mediastinal and bilateral hilar lymph nodes, presumably reactive. 3. Mild dilatation of the pulmonic trunk (3.5 cm in diameter), suggestive of  pulmonary arterial hypertension. 4. Mild cardiomegaly. 5. Atherosclerosis. 6. Additional incidental findings, as above. Electronically Signed   By: Vinnie Langton M.D.   On: 03/26/2016 14:56   Dg Chest 1 View  03/28/2016  CLINICAL DATA:  60 year old female with dyspnea. Subsequent encounter. EXAM: CHEST 1 VIEW COMPARISON:  03/27/2016. FINDINGS: Endotracheal tube with tip 2.8 cm above the carina. Right central line tip proximal superior vena cava level. Severe multi lobular bronchopneumonia throughout both lungs noted on recent CT not as well delineated on the present plain film exam. Pulmonary vascular congestion. Follow-up until clearance recommended to exclude underlying malignancy. Cardiomegaly. IMPRESSION: Severe multi lobular bronchopneumonia throughout both lungs noted on recent CT not as well delineated on the present plain film exam. Pulmonary vascular congestion. Electronically Signed   By: Genia Del M.D.   On: 03/28/2016 07:50   Dg Chest 1 View  03/27/2016  CLINICAL DATA:  60 year old female with a history of respiratory failure. EXAM: CHEST 1 VIEW COMPARISON:  CT 03/26/2016, chest x-ray 03/26/2016, 03/25/2016 FINDINGS: Cardiomediastinal silhouette unchanged. Multifocal bilateral airspace disease, better characterized on prior CT. No large pleural effusion.  No visualized pneumothorax. Endotracheal tube remains in position, terminating approximately 3.5 cm above the carina. Right subclavian central venous catheter appearing to terminate in the superior vena cava. Overlying EKG leads. Interval removal of the defibrillator pads Gastric tube projects over the mediastinum and terminates out of the field of view. IMPRESSION: Similar appearance of multifocal pneumonia with no large pleural effusion. Interval removal of defibrillator pads, with otherwise unchanged support apparatus. Signed, Dulcy Fanny. Earleen Newport, DO [facility]iology Electronically Signed    By: Corrie Mckusick D.O.   On: 03/27/2016 08:23   Ct Chest Wo Contrast  03/26/2016  CLINICAL DATA:  60 year old female with history of abdominal pain. Respiratory arrest and brief cardiac arrest. EXAM: CT CHEST, ABDOMEN AND PELVIS WITHOUT CONTRAST TECHNIQUE: Multidetector CT imaging of the chest, abdomen and pelvis was performed following the standard protocol without IV contrast. COMPARISON:  No priors. FINDINGS: CT CHEST FINDINGS Mediastinum/Lymph Nodes: Heart size is mildly enlarged. There is no significant pericardial fluid, thickening or pericardial calcification. Mild dilatation of the pulmonic trunk (3.5 cm in diameter). Multiple borderline enlarged and mildly enlarged mediastinal and hilar lymph nodes measuring up to 12 mm in short axis in the low right paratracheal nodal station. Small hiatal hernia. Near complete collapse of the distal trachea and main bronchi on expiratory phase imaging, indicative of severe tracheobronchomalacia. No axillary lymphadenopathy. Lungs/Pleura: Diffuse bronchial wall thickening with diffuse centrilobular ground-glass attenuation micro and macronodularity throughout all aspects of the lungs bilaterally, forming into near confluent opacities in the right lower lobe, most compatible with  severe multilobar bronchopneumonia. No pleural effusions. Musculoskeletal/Soft Tissues: There are no aggressive appearing lytic or blastic lesions noted in the visualized portions of the skeleton. CT ABDOMEN AND PELVIS FINDINGS Hepatobiliary: Small calcified granuloma in the right lobe of the liver. No definite cystic or solid hepatic lesions are identified within the liver on today's noncontrast CT examination. Gallbladder is not visualized, presumably surgically absent (no surgical clips are noted in the gallbladder fossa). Pancreas: No definite pancreatic mass or peripancreatic inflammatory changes on today's noncontrast CT examination. Spleen: Small calcified granulomas in the spleen.  Adrenals/Urinary Tract: Unenhanced appearance of the adrenal glands and kidneys bilaterally is normal. No hydroureteronephrosis. Urinary bladder is nearly completely decompressed around an indwelling Foley catheter. Stomach/Bowel: Unenhanced appearance of the stomach is normal. There is no pathologic dilatation of small bowel or colon. The appendix is not confidently identified may be surgically absent. Regardless, there are no inflammatory changes noted adjacent to the cecum to suggest the presence of an acute appendicitis at this time. Vascular/Lymphatic: Mild atherosclerotic calcifications throughout the abdominal and pelvic vasculature, without definite aneurysm. No lymphadenopathy noted in the abdomen or pelvis. Reproductive: Status post hysterectomy. Ovaries are not confidently identified may be surgically absent or atrophic. Other: Trace volume of ascites.  No pneumoperitoneum. Musculoskeletal: There are no aggressive appearing lytic or blastic lesions noted in the visualized portions of the skeleton. IMPRESSION: 1. Severe multilobar bronchopneumonia throughout the lungs bilaterally. 2. Multiple borderline enlarged and minimally enlarged mediastinal and bilateral hilar lymph nodes, presumably reactive. 3. Mild dilatation of the pulmonic trunk (3.5 cm in diameter), suggestive of pulmonary arterial hypertension. 4. Mild cardiomegaly. 5. Atherosclerosis. 6. Additional incidental findings, as above. Electronically Signed   By: Vinnie Langton M.D.   On: 03/26/2016 14:56     Medications:   . dextrose 5 % and 0.9% NaCl Stopped (03/27/16 1024)  . fentaNYL infusion INTRAVENOUS 400 mcg/hr (03/28/16 0800)  . furosemide (LASIX) infusion Stopped (03/27/16 0956)  . heparin 1,800 Units/hr (03/28/16 0800)  . midazolam (VERSED) infusion 6 mg/hr (03/28/16 0800)  . norepinephrine 3.013 mcg/min (03/28/16 0800)   . antiseptic oral rinse  7 mL Mouth Rinse 10 times per day  . budesonide (PULMICORT) nebulizer  solution  0.25 mg Nebulization 4 times per day  . ceFEPime (MAXIPIME) IV  2 g Intravenous 3 times per day  . chlorhexidine gluconate (SAGE KIT)  15 mL Mouth Rinse BID  . fentaNYL (SUBLIMAZE) injection  50 mcg Intravenous Once  . insulin aspart  0-15 Units Subcutaneous 6 times per day  . ipratropium-albuterol  3 mL Nebulization Q6H  . levothyroxine  37.5 mcg Intravenous Daily  . metronidazole  500 mg Intravenous Q8H  . pantoprazole (PROTONIX) IV  40 mg Intravenous QHS  . polyethylene glycol  17 g Oral BID  . senna-docusate  1 tablet Oral BID   sodium chloride, acetaminophen, albuterol, fentaNYL, midazolam, ondansetron (ZOFRAN) IV  Assessment/ Plan:  Tasha Long is a 60 y.o. 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   1. Acute renal failure on chronic kidney disease stage III with baseline creatinine of 1.2, eGFR of 49: anuric. Secondary to ATN. With elevated vancomycin levels. On vasopressors. Not responsive to IV diuretics. Anuric. Low threshold to start dialysis.  - discontinued furosemide gtt.  - discussed dialysis  with patient's family. Will need temp HD catheter most likely. Discussed case with Dr. Mortimer Fries.   3. Acute respiratory failure: intubated on mechanical ventilation. ABG reviewed. CXR with multilobar pneumonia - cefepime, metronidazole  3. Hypotension: septic shock requiring vasopressors.   4. Anemia: hemoglobin 8.1 - low threshold for epo.    LOS: [place], Tasha Long 4/17/20179:06 AM

## 2016-03-28 NOTE — Progress Notes (Signed)
ANTICOAGULATION CONSULT NOTE - Initial Consult  Pharmacy Consult for Heparin Indication: pulmonary embolus  Allergies  Allergen Reactions  . Codeine Hives  . Sulfa Antibiotics Hives   Patient Measurements: Height: 5' 5"  (165.1 cm) Weight: (!) 398 lb 2.4 oz (180.6 kg) IBW/kg (Calculated) : 57 Heparin Dosing Weight: 93.6 kg  Vital Signs: Temp: 99.1 F (37.3 C) (04/17 0500) BP: 113/47 mmHg (04/17 0500) Pulse Rate: 72 (04/17 0500)  Labs:  Recent Labs  03/25/16 0719 03/25/16 1303 03/25/16 1529  03/25/16 2228 03/26/16 0535 03/27/16 0352 03/28/16 0349  HGB 9.8*  --   --   --   --  8.4* 8.2* 8.1*  HCT 32.8*  --   --   --   --  27.0* 26.7* 25.7*  PLT 320  --   --   --   --  195 192 188  APTT SPECIMEN CLOTTED 79*  --   --   --   --   --   --   LABPROT SPECIMEN CLOTTED 16.9*  --   --   --   --   --   --   INR SPECIMEN CLOTTED 1.36  --   --   --   --   --   --   HEPARINUNFRC  --   --   --   < > 0.34 0.59 0.53 0.27*  CREATININE 1.20*  --  1.59*  --  1.55* 1.74* 2.23* 2.92*  TROPONINI 0.56*  --  0.81*  --  0.42*  --   --   --   < > = values in this interval not displayed.  Estimated Creatinine Clearance: 35.3 mL/min (by C-G formula based on Cr of 2.92).   Medical History: History reviewed. No pertinent past medical history.  Medications:  Scheduled:  . antiseptic oral rinse  7 mL Mouth Rinse 10 times per day  . budesonide (PULMICORT) nebulizer solution  0.25 mg Nebulization 4 times per day  . ceFEPime (MAXIPIME) IV  2 g Intravenous 3 times per day  . chlorhexidine gluconate (SAGE KIT)  15 mL Mouth Rinse BID  . fentaNYL (SUBLIMAZE) injection  50 mcg Intravenous Once  . heparin  1,500 Units Intravenous Once  . insulin aspart  0-15 Units Subcutaneous 6 times per day  . ipratropium-albuterol  3 mL Nebulization Q6H  . levothyroxine  37.5 mcg Intravenous Daily  . metronidazole  500 mg Intravenous Q8H  . pantoprazole (PROTONIX) IV  40 mg Intravenous QHS  . polyethylene  glycol  17 g Oral BID  . senna-docusate  1 tablet Oral BID   Infusions:  . dextrose 5 % and 0.9% NaCl Stopped (03/27/16 1024)  . fentaNYL infusion INTRAVENOUS 400 mcg/hr (03/28/16 0240)  . furosemide (LASIX) infusion Stopped (03/27/16 0956)  . heparin 1,600 Units/hr (03/28/16 0442)  . midazolam (VERSED) infusion 6 mg/hr (03/28/16 0240)  . norepinephrine 3 mcg/min (03/28/16 0314)    Assessment: 60 y/o F admitted with respiratory arrest ordered heparin for possible PE.   Goal of Therapy:  Heparin level 0.3-0.7 units/ml Monitor platelets by anticoagulation protocol: Yes   Plan:  Heparin level subtherapeutic. 1500 unit IV x 1 bolus and increase rate to 1800 units/hr. Will recheck level in 6 hours.  Laural Benes, Pharm.D., BCPS Clinical Pharmacist 03/28/2016,5:43 AM

## 2016-03-28 NOTE — Progress Notes (Signed)
ANTICOAGULATION CONSULT NOTE - Initial Consult  Pharmacy Consult for Heparin Indication: pulmonary embolus  Allergies  Allergen Reactions  . Codeine Hives  . Sulfa Antibiotics Hives   Patient Measurements: Height: _0  (165.1 cm) Weight: (!) 403 lb 7.1 oz (183 kg) IBW/kg (Calculated) : 57 Heparin Dosing Weight: 93.6 kg  Vital Signs: Temp: 97.9 F (36.6 C) (04/17 1300) Temp Source: Core (Comment) (04/17 0800) BP: 118/45 mmHg (04/17 1300) Pulse Rate: 81 (04/17 1300)  Labs:  Recent Labs  03/25/16 1529  03/25/16 2228  03/26/16 0535 03/27/16 0352 03/28/16 0349 03/28/16 1258  HGB  --   --   --   < > 8.4* 8.2* 8.1*  --   HCT  --   --   --   --  27.0* 26.7* 25.7*  --   PLT  --   --   --   --  195 192 188  --   HEPARINUNFRC  --   < > 0.34  --  0.59 0.53 0.27* 0.29*  CREATININE 1.59*  --  1.55*  --  1.74* 2.23* 2.92*  --   TROPONINI 0.81*  --  0.42*  --   --   --   --   --   < > = values in this interval not displayed.  Estimated Creatinine Clearance: 35.6 mL/min (by C-G formula based on Cr of 2.92).   Medical History: History reviewed. No pertinent past medical history.  Medications:  Scheduled:  . antiseptic oral rinse  7 mL Mouth Rinse 10 times per day  . budesonide (PULMICORT) nebulizer solution  0.25 mg Nebulization 4 times per day  . chlorhexidine gluconate (SAGE KIT)  15 mL Mouth Rinse BID  . dextrose      . fentaNYL (SUBLIMAZE) injection  50 mcg Intravenous Once  . heparin  1,500 Units Intravenous Once  . insulin aspart  0-15 Units Subcutaneous 6 times per day  . ipratropium-albuterol  3 mL Nebulization Q6H  . levothyroxine  37.5 mcg Intravenous Daily  . meropenem (MERREM) IV  1 g Intravenous Q12H  . pantoprazole (PROTONIX) IV  40 mg Intravenous QHS  . polyethylene glycol  17 g Oral BID  . senna-docusate  1 tablet Oral BID   Infusions:  . dextrose 5 % and 0.9% NaCl Stopped (03/27/16 1024)  . fentaNYL infusion INTRAVENOUS 400 mcg/hr (03/28/16 0800)  .  furosemide (LASIX) infusion Stopped (03/27/16 0956)  . heparin 1,800 Units/hr (03/28/16 0800)  . midazolam (VERSED) infusion 6 mg/hr (03/28/16 0800)  . norepinephrine 3.013 mcg/min (03/28/16 0800)    Assessment: 60 y/o F admitted with respiratory arrest ordered heparin for possible PE.   Goal of Therapy:  Heparin level 0.3-0.7 units/ml Monitor platelets by anticoagulation protocol: Yes   Plan:  Heparin level subtherapeutic. 1500 unit IV x 1 bolus and increase rate to 2000 units/hr. Will recheck level in 6 hours.  Ulice Dash D, Pharm.D., BCPS Clinical Pharmacist 03/28/2016,2:24 PM

## 2016-03-28 NOTE — Progress Notes (Signed)
Pharmacy Antibiotic Note  Tasha Long is a 60 y.o. female admitted on 03/25/2016 with respiratory distress ordered empiric abx.  Pharmacy has been consulted for vancomycin, cefepime, and metronidazole dosing. Patient received Zosyn x 1.   Plan: Repeat vancomycin level 45 mcg/mL. Continue to hold vancomycin. Will recheck random level tomorrow afternoon and redose as needed.    Metronidazole 500 mg iv q 8 hours.   Cefepime 2 g iv q 8 hours starting 6 hours after [employer].   Height: 5\' 5"  (165.1 cm) Weight: (!) 403 lb 7.1 oz (183 kg) IBW/kg (Calculated) : 57  Temp (24hrs), Avg:98 F (36.7 C), Min:97 F (36.1 C), Max:99.1 F (37.3 C)   Recent Labs Lab 03/25/16 0719 03/25/16 1303 03/25/16 1529 03/25/16 2228 03/26/16 0535 03/27/16 0352 03/27/16 1626 03/28/16 0349 03/28/16 0549  WBC 17.9*  --   --   --  5.4 7.0  --  5.4  --   CREATININE 1.20*  --  1.59* 1.55* 1.74* 2.23*  --  2.92*  --   LATICACIDVEN 4.1* 1.4  --   --   --   --   --   --   --   VANCOTROUGH  --   --   --   --   --  55* 46*  --   --   VANCORANDOM  --   --   --   --   --   --   --   --  45    Estimated Creatinine Clearance: 35.6 mL/min (by C-G formula based on Cr of 2.92).    Allergies  Allergen Reactions  . Codeine Hives  . Sulfa Antibiotics Hives    Antimicrobials this admission: Zosyn 4/14 >> 4/14 vancomycin 4/14 >>  Cefepime 4/14 >> Metronidazole 4/14 >>  Dose adjustments this admission:   Microbiology results: 4/14 BCx: pending UCx: pending  Sputum: pending   Thank you for allowing pharmacy to be a part of this patient's care.  Laural Benes, Pharm.D. Clinical Pharmacist 03/28/2016 6:28 AM

## 2016-03-28 NOTE — Progress Notes (Signed)
Pt is stable at this time. CVP's are averaging 17 on myshift. Pt had 240 cc output on my shift. No appearance of pain using the CPOT tool. Pt has become bradycardic with HR at 58.Elink notified new orders given. Report given to oncoming nurse.

## 2016-03-28 NOTE — Progress Notes (Signed)
  Pulled 3 cm of the left IJ  HD cath, resutured and orderedchest X-RAY.   [facility]

## 2016-03-28 NOTE — Procedures (Signed)
Central Venous Dailysis Catheter Placement: Indication: Hemo Dialysis/CRRT   Consent:verbal/written  Risks and benefits explained in detail including risk of infection, bleeding, respiratory failure and death..   Hand washing performed prior to starting the procedure.   Procedure: An active timeout was performed and correct patient, name, & ID confirmed.  After explaining risk and benefits, patient was positioned correctly for central venous access. Patient was prepped using strict sterile technique including chlorohexadine preps, sterile drape, sterile gown and sterile gloves.  The area was prepped, draped and anesthetized in the usual sterile manner. Patient comfort was obtained.  A triple lumen catheter was placed in Left IJ  Vein There was good blood return, catheter caps were placed on lumens, catheter flushed easily, the line was secured and a sterile dressing and BIO-PATCH applied.   Ultrasound was used to visualize vasculature and guidance of needle.   Number of Attempts: 1 Complications:none  Estimated Blood Loss: none Chest Radiograph indicated and ordered.  Operator: Zaliah Wissner/[place]   Corrin Parker, M.D.  [facility]  Medical Director Justin Director [facility] Cardio-Pulmonary Department

## 2016-03-28 NOTE — Plan of Care (Signed)
Problem: Phase I Progression Outcomes Goal: Hemodynamically stable Outcome: Not Progressing Requires levophed for blood pressure control.

## 2016-03-28 NOTE — Progress Notes (Signed)
Chaplain rounded the unit and provided a compassionate presence with support through silent prayer to the patient. Tasha Long [phone]

## 2016-03-28 NOTE — Progress Notes (Signed)
[facility] Progess Note    ASSESSMENT/PLAN    MAJOR EVENTS/TEST RESULTS: 04/14 TTE: pending.  04/14 LE venous US: negative.  03/25/16; abdomen US: negative, but incomplete exam.    INDWELLING DEVICES:: ETT 04/14 >>  R [place] CVL 04/14 >>   MICRO DATA: Urine 04/14 >> pending.  Resp 04/14 >> pending.  Blood 04/14 >> pending.  MRSA PCR positive.   PCT algorithm 04/14: 1.48  ANTIMICROBIALS:  Vanc 04/14 >>  Metronidazole 04/14 >>  Cefepime 04/14 >>   CXR reviewed images, ETT in position, continue right hilar prominence.  ---------------------------------------   ----------------------------------------   Name: Tasha Long MRN: [id number] DOB: [birth year]    ADMISSION DATE:  03/25/2016  VITAL SIGNS: Temp:  [97 F (36.1 C)-99.5 F (37.5 C)] 99.1 F (37.3 C) (04/17 0900) Pulse Rate:  [63-87] 81 (04/17 0900) Resp:  [17-28] 26 (04/17 0900) BP: (84-141)/(33-56) 141/56 mmHg (04/17 0900) SpO2:  [84 %-100 %] 98 % (04/17 0900) FiO2 (%):  [40 %] 40 % (04/17 0800) Weight:  [403 lb 7.1 oz (183 kg)] 403 lb 7.1 oz (183 kg) (04/17 0500) HEMODYNAMICS: CVP:  [15 mmHg-18 mmHg] 15 mmHg VENTILATOR SETTINGS: Vent Mode:  [-] PCV FiO2 (%):  [40 %] 40 % Set Rate:  [26 bmp] 26 bmp PEEP:  [5 cmH20] 5 cmH20 INTAKE / OUTPUT:  Intake/Output Summary (Last 24 hours) at 03/28/16 1107 Last data filed at 03/28/16 0800  Gross per 24 hour  Intake 2781.81 ml  Output    165 ml  Net 2616.81 ml   SUBJECTIVE:  Patient remains obtunded and on ventilator, sedation is off since this morning    PHYSICAL EXAMINATION: Physical Examination:   VS: BP 141/56 mmHg  Pulse 81  Temp(Src) 99.1 F (37.3 C) (Core (Comment))  Resp 26  Ht 5\' 5"  (1.651 m)  Wt 403 lb 7.1 oz (183 kg)  BMI 67.14 kg/m2  SpO2 98%  General Appearance: Very sickly appearing white female Neuro:Atraumatic, normocephalic,Grimaces to pain HEENT: PERRLA, EOM intact. Pulmonary: Clear  bilaterally, no weezes, crackles, rhonchi noted   Cardiovascular Normal S1,S2.  NoMRG, RRR  Abdomen Obese, round, hypoactive bowel sounds Renal:  No costovertebral tenderness  GU:  Not performed at this time. Endocrine: No evident thyromegaly. Skin:   warm,, no ecchymosis ,cellulitis present , red Extremities: +3 pitting edema, warm to touch, red   LABS:   LABORATORY PANEL:   CBC  Recent Labs Lab 03/28/16 0349  WBC 5.4  HGB 8.1*  HCT 25.7*  PLT 188    Chemistries   Recent Labs Lab 03/27/16 0352 03/28/16 0349  NA 134* 133*  K 4.1 4.1  CL 101 102  CO2 27 23  GLUCOSE 90 79  BUN 24* 29*  CREATININE 2.23* 2.92*  CALCIUM 7.9* 8.0*  MG 2.1  --   PHOS 5.5*  --   AST  --  29  ALT  --  11*  ALKPHOS  --  30*  BILITOT  --  1.0     Recent Labs Lab 03/27/16 1146 03/27/16 2002 03/27/16 2025 03/28/16 03/28/16 0354 03/28/16 0729  GLUCAP 72 69 99 86 70 70    Recent Labs Lab 03/27/16 0500 03/27/16 1200 03/28/16 0500  PHART 7.24* 7.25* 7.40  PCO2ART 66* 59* 40  PO2ART 77* 69* 93    Recent Labs Lab 03/25/16 0719 03/25/16 2228 03/28/16 0349  AST 30 30 29   ALT 10* 12* 11*  ALKPHOS 45 31* 30*  BILITOT 0.4 0.6  1.0  ALBUMIN 3.5 2.6* 2.4*    Cardiac Enzymes  Recent Labs Lab 03/25/16 2228  TROPONINI 0.42*    RADIOLOGY:  Ct Abdomen Pelvis Wo Contrast  03/26/2016  CLINICAL DATA:  60 year old female with history of abdominal pain. Respiratory arrest and brief cardiac arrest. EXAM: CT CHEST, ABDOMEN AND PELVIS WITHOUT CONTRAST TECHNIQUE: Multidetector CT imaging of the chest, abdomen and pelvis was performed following the standard protocol without IV contrast. COMPARISON:  No priors. FINDINGS: CT CHEST FINDINGS Mediastinum/Lymph Nodes: Heart size is mildly enlarged. There is no significant pericardial fluid, thickening or pericardial calcification. Mild dilatation of the pulmonic trunk (3.5 cm in diameter). Multiple borderline enlarged and mildly enlarged  mediastinal and hilar lymph nodes measuring up to 12 mm in short axis in the low right paratracheal nodal station. Small hiatal hernia. Near complete collapse of the distal trachea and main bronchi on expiratory phase imaging, indicative of severe tracheobronchomalacia. No axillary lymphadenopathy. Lungs/Pleura: Diffuse bronchial wall thickening with diffuse centrilobular ground-glass attenuation micro and macronodularity throughout all aspects of the lungs bilaterally, forming into near confluent opacities in the right lower lobe, most compatible with severe multilobar bronchopneumonia. No pleural effusions. Musculoskeletal/Soft Tissues: There are no aggressive appearing lytic or blastic lesions noted in the visualized portions of the skeleton. CT ABDOMEN AND PELVIS FINDINGS Hepatobiliary: Small calcified granuloma in the right lobe of the liver. No definite cystic or solid hepatic lesions are identified within the liver on today's noncontrast CT examination. Gallbladder is not visualized, presumably surgically absent (no surgical clips are noted in the gallbladder fossa). Pancreas: No definite pancreatic mass or peripancreatic inflammatory changes on today's noncontrast CT examination. Spleen: Small calcified granulomas in the spleen. Adrenals/Urinary Tract: Unenhanced appearance of the adrenal glands and kidneys bilaterally is normal. No hydroureteronephrosis. Urinary bladder is nearly completely decompressed around an indwelling Foley catheter. Stomach/Bowel: Unenhanced appearance of the stomach is normal. There is no pathologic dilatation of small bowel or colon. The appendix is not confidently identified may be surgically absent. Regardless, there are no inflammatory changes noted adjacent to the cecum to suggest the presence of an acute appendicitis at this time. Vascular/Lymphatic: Mild atherosclerotic calcifications throughout the abdominal and pelvic vasculature, without definite aneurysm. No  lymphadenopathy noted in the abdomen or pelvis. Reproductive: Status post hysterectomy. Ovaries are not confidently identified may be surgically absent or atrophic. Other: Trace volume of ascites.  No pneumoperitoneum. Musculoskeletal: There are no aggressive appearing lytic or blastic lesions noted in the visualized portions of the skeleton. IMPRESSION: 1. Severe multilobar bronchopneumonia throughout the lungs bilaterally. 2. Multiple borderline enlarged and minimally enlarged mediastinal and bilateral hilar lymph nodes, presumably reactive. 3. Mild dilatation of the pulmonic trunk (3.5 cm in diameter), suggestive of pulmonary arterial hypertension. 4. Mild cardiomegaly. 5. Atherosclerosis. 6. Additional incidental findings, as above. Electronically Signed   By: Vinnie Langton M.D.   On: 03/26/2016 14:56   Dg Chest 1 View  03/28/2016  CLINICAL DATA:  60 year old female with dyspnea. Subsequent encounter. EXAM: CHEST 1 VIEW COMPARISON:  03/27/2016. FINDINGS: Endotracheal tube with tip 2.8 cm above the carina. Right central line tip proximal superior vena cava level. Severe multi lobular bronchopneumonia throughout both lungs noted on recent CT not as well delineated on the present plain film exam. Pulmonary vascular congestion. Follow-up until clearance recommended to exclude underlying malignancy. Cardiomegaly. IMPRESSION: Severe multi lobular bronchopneumonia throughout both lungs noted on recent CT not as well delineated on the present plain film exam. Pulmonary vascular congestion. Electronically Signed  By: Genia Del M.D.   On: 03/28/2016 07:50   Dg Chest 1 View  03/27/2016  CLINICAL DATA:  60 year old female with a history of respiratory failure. EXAM: CHEST 1 VIEW COMPARISON:  CT 03/26/2016, chest x-ray 03/26/2016, 03/25/2016 FINDINGS: Cardiomediastinal silhouette unchanged. Multifocal bilateral airspace disease, better characterized on prior CT. No large pleural effusion.  No visualized  pneumothorax. Endotracheal tube remains in position, terminating approximately 3.5 cm above the carina. Right subclavian central venous catheter appearing to terminate in the superior vena cava. Overlying EKG leads. Interval removal of the defibrillator pads Gastric tube projects over the mediastinum and terminates out of the field of view. IMPRESSION: Similar appearance of multifocal pneumonia with no large pleural effusion. Interval removal of defibrillator pads, with otherwise unchanged support apparatus. Signed, Dulcy Fanny. Earleen Newport, DO [facility] Radiology Electronically Signed   By: Corrie Mckusick D.O.   On: 03/27/2016 08:23   Ct Chest Wo Contrast  03/26/2016  CLINICAL DATA:  60 year old female with history of abdominal pain. Respiratory arrest and brief cardiac arrest. EXAM: CT CHEST, ABDOMEN AND PELVIS WITHOUT CONTRAST TECHNIQUE: Multidetector CT imaging of the chest, abdomen and pelvis was performed following the standard protocol without IV contrast. COMPARISON:  No priors. FINDINGS: CT CHEST FINDINGS Mediastinum/Lymph Nodes: Heart size is mildly enlarged. There is no significant pericardial fluid, thickening or pericardial calcification. Mild dilatation of the pulmonic trunk (3.5 cm in diameter). Multiple borderline enlarged and mildly enlarged mediastinal and hilar lymph nodes measuring up to 12 mm in short axis in the low right paratracheal nodal station. Small hiatal hernia. Near complete collapse of the distal trachea and main bronchi on expiratory phase imaging, indicative of severe tracheobronchomalacia. No axillary lymphadenopathy. Lungs/Pleura: Diffuse bronchial wall thickening with diffuse centrilobular ground-glass attenuation micro and macronodularity throughout all aspects of the lungs bilaterally, forming into near confluent opacities in the right lower lobe, most compatible with severe multilobar bronchopneumonia. No pleural effusions.  Musculoskeletal/Soft Tissues: There are no aggressive appearing lytic or blastic lesions noted in the visualized portions of the skeleton. CT ABDOMEN AND PELVIS FINDINGS Hepatobiliary: Small calcified granuloma in the right lobe of the liver. No definite cystic or solid hepatic lesions are identified within the liver on today's noncontrast CT examination. Gallbladder is not visualized, presumably surgically absent (no surgical clips are noted in the gallbladder fossa). Pancreas: No definite pancreatic mass or peripancreatic inflammatory changes on today's noncontrast CT examination. Spleen: Small calcified granulomas in the spleen. Adrenals/Urinary Tract: Unenhanced appearance of the adrenal glands and kidneys bilaterally is normal. No hydroureteronephrosis. Urinary bladder is nearly completely decompressed around an indwelling Foley catheter. Stomach/Bowel: Unenhanced appearance of the stomach is normal. There is no pathologic dilatation of small bowel or colon. The appendix is not confidently identified may be surgically absent. Regardless, there are no inflammatory changes noted adjacent to the cecum to suggest the presence of an acute appendicitis at this time. Vascular/Lymphatic: Mild atherosclerotic calcifications throughout the abdominal and pelvic vasculature, without definite aneurysm. No lymphadenopathy noted in the abdomen or pelvis. Reproductive: Status post hysterectomy. Ovaries are not confidently identified may be surgically absent or atrophic. Other: Trace volume of ascites.  No pneumoperitoneum. Musculoskeletal: There are no aggressive appearing lytic or blastic lesions noted in the visualized portions of the skeleton. IMPRESSION: 1. Severe multilobar bronchopneumonia throughout the lungs bilaterally. 2. Multiple borderline enlarged and minimally enlarged mediastinal and bilateral hilar lymph nodes, presumably reactive. 3. Mild dilatation of the pulmonic trunk (3.5 cm in diameter), suggestive of  pulmonary arterial hypertension. 4. Mild cardiomegaly. 5. Atherosclerosis. 6. Additional incidental findings, as above. Electronically Signed   By: Vinnie Langton M.D.   On: 03/26/2016 14:56    PT PROFILE:  79 F in chronically very poor health developed abdominal pain and EMS was dispatched. Suffered respiratory arrest and brief cardiac arrest in the transfer from her bed to wheelchair and into ambulance. Intubated and transported to ED. Suffered brief PEA arrest again in ICU shortly after arrival   PULMONARY A: Respiratory arrest COPD OSA Smoker Concern for PE given numerous RFs - no CTA given AKI.  P:  Continue anticoagulation empirically.  Continue vent support, not weaning candidate at this time.   Continue Albuterol, pulmicort  Nebs.   CARDIOVASCULAR A:  Cardiac arrest due to respiratory arrest Hypertension Hyperlipidemia  P:   Echo indicative of Four-chamber dilated patient, with moderate left ventricle  systolic dysfunction and mild diastolic dysfunction with diffuse  hypokinesis   MAP goal > 65 mmHg CVL placed  RENAL A:  AKI, increased creatinine today.  Hyperkalemia Very poor candidate for HD P:  Monitor I/O, monitor kidney function.  Continue IVF. Nephrology following the patient  CRRT  Per nephrology  GASTROINTESTINAL A:  Morbid obesity Abdominal pain ? C-diff P:  SUP: IV PPI TF protocol Continue Flagyl . HEMATOLOGIC A:  Anemia with microcytosis - no overt bleeding P:  DVT px: full dose heparin heparin Monitor CBC intermittently Transfuse per usual guidelines  INFECTIOUS A:  Possible abdominal sepsis Possible PNA Possible LE cellulitis with prior hx of MRSA cellulitis P:  Monitor temp, WBC count Micro and abx as above  ENDOCRINE A:  Suspect insulin resistance though no history of DM Glucose controlled.  Hypothyroidism P:  IV L-thyroxine. Transition to enteral when able SSI - mod  scale  NEUROLOGIC A:  Post arrest anoxic encephalopathy ICU associated discomfort P:  RASS goal: -2, -3 PAD protocol Daily WUA Stop sedation and repeat CT of head.       [facility]

## 2016-03-28 NOTE — Progress Notes (Signed)
Pt turned onto Right side with pillow prop.

## 2016-03-28 NOTE — Progress Notes (Signed)
Nutrition Follow-up  DOCUMENTATION CODES:   Morbid obesity  INTERVENTION:   Coordination of Care: discussed nutritional poc during ICU rounds, pt with continued output via OG tube, MD did not want to start TF today, continue to assess  NUTRITION DIAGNOSIS:   Inadequate oral intake related to acute illness as evidenced by NPO status.  GOAL:   Provide needs based on ASPEN/SCCM guidelines  MONITOR:   Vent status, Labs, Weight trends, I & O's  REASON FOR ASSESSMENT:   Ventilator    ASSESSMENT:     Pt remains on vent, poor UOP, levophed, plan to start dialysis today  Diet Order:   NPO day 3  Skin:  Reviewed, no issues  Last BM:  no BM documented   Digestive System: CT abdomen negative, OG with 450 mL out during night shift, 100 mL out this far today Labs: sodium 133, Creatinine 2.92  Meds: ss novolog, lasix drip, D5-NS at 50 ml/hr  Height:   Ht Readings from Last 1 Encounters:  03/25/16 5\' 5"  (1.651 m)    Weight:   Wt Readings from Last 1 Encounters:  03/28/16 403 lb 7.1 oz (183 kg)    BMI:  Body mass index is 67.14 kg/(m^2).  Estimated Nutritional Needs:   Kcal:  1254-1425 kcals (22-25 kcals/kg IBW per ASPEN guidelines for BMI >50)  Protein:  143 g (2.5 g/kg)   Fluid:  >1.7 L  EDUCATION NEEDS:   Education needs no appropriate at this time  [place], [place], [place] [phone] Pager  [phone] Weekend/On-Call Pager

## 2016-03-28 NOTE — Progress Notes (Signed)
ANTICOAGULATION CONSULT NOTE - Initial Consult  Pharmacy Consult for Heparin Indication: pulmonary embolus  Allergies  Allergen Reactions  . Codeine Hives  . Sulfa Antibiotics Hives   Patient Measurements: Height: _0  (165.1 cm) Weight: (!) 403 lb 7.1 oz (183 kg) IBW/kg (Calculated) : 57 Heparin Dosing Weight: 93.6 kg  Vital Signs: Temp: 95.2 F (35.1 C) (04/17 2100) Temp Source: Core (Comment) (04/17 1600) BP: 130/56 mmHg (04/17 2000) Pulse Rate: 70 (04/17 2100)  Labs:  Recent Labs  03/25/16 2228  03/26/16 0535 03/27/16 0352 03/28/16 0349 03/28/16 1258 03/28/16 2152  HGB  --   < > 8.4* 8.2* 8.1*  --   --   HCT  --   --  27.0* 26.7* 25.7*  --   --   PLT  --   --  195 192 188  --   --   HEPARINUNFRC 0.34  --  0.59 0.53 0.27* 0.29* 0.56  CREATININE 1.55*  --  1.74* 2.23* 2.92*  --   --   TROPONINI 0.42*  --   --   --   --   --   --   < > = values in this interval not displayed.  Estimated Creatinine Clearance: 35.6 mL/min (by C-G formula based on Cr of 2.92).   Medical History: History reviewed. No pertinent past medical history.  Medications:  Scheduled:  . antiseptic oral rinse  7 mL Mouth Rinse 10 times per day  . budesonide (PULMICORT) nebulizer solution  0.25 mg Nebulization 4 times per day  . chlorhexidine gluconate (SAGE KIT)  15 mL Mouth Rinse BID  . dextrose      . fentaNYL (SUBLIMAZE) injection  50 mcg Intravenous Once  . insulin aspart  0-15 Units Subcutaneous 6 times per day  . ipratropium-albuterol  3 mL Nebulization Q6H  . levothyroxine  37.5 mcg Intravenous Daily  . meropenem (MERREM) IV  1 g Intravenous Q12H  . pantoprazole (PROTONIX) IV  40 mg Intravenous QHS  . polyethylene glycol  17 g Oral BID  . senna-docusate  1 tablet Oral BID   Infusions:  . dextrose 5 % and 0.9% NaCl Stopped (03/27/16 1024)  . fentaNYL infusion INTRAVENOUS 250 mcg/hr (03/28/16 2018)  . furosemide (LASIX) infusion Stopped (03/27/16 0956)  . heparin 2,000  Units/hr (03/28/16 2018)  . midazolam (VERSED) infusion 2 mg/hr (03/28/16 2018)  . norepinephrine 1.013 mcg/min (03/28/16 2148)    Assessment: 60 y/o F admitted with respiratory arrest ordered heparin for possible PE.   Goal of Therapy:  Heparin level 0.3-0.7 units/ml Monitor platelets by anticoagulation protocol: Yes   Plan:  Heparin level subtherapeutic. 1500 unit IV x 1 bolus and increase rate to 2000 units/hr. Will recheck level in 6 hours.  4/17 at 21:52  HL = 0.56. Will continue current dosing.  Will recheck HL with AM labs.   [place] Clinical Pharmacist 03/28/2016,10:20 PM

## 2016-03-28 NOTE — Progress Notes (Signed)
Pharmacy Antibiotic Note  Tasha Long is a 60 y.o. female admitted on 03/25/2016 with respiratory distress ordered empiric abx.  Pharmacy has been consulted for vancomycin, cefepime, and metronidazole dosing.  Plan: Repeat vancomycin level 45 mcg/mL. Continue to hold vancomycin. Will recheck random level tomorrow afternoon and redose as needed.    Patient is growing ESBL E coli in urine. After discussion with Dr. Alva Garnet, will change abx to vancomycin and meropenem. Will begin meropenem 1 g iv q 12 hours.   Height: 5\' 5"  (165.1 cm) Weight: (!) 403 lb 7.1 oz (183 kg) IBW/kg (Calculated) : 57  Temp (24hrs), Avg:98.2 F (36.8 C), Min:97 F (36.1 C), Max:99.5 F (37.5 C)   Recent Labs Lab 03/25/16 0719 03/25/16 1303 03/25/16 1529 03/25/16 2228 03/26/16 0535 03/27/16 0352 03/27/16 1626 03/28/16 0349 03/28/16 0549  WBC 17.9*  --   --   --  5.4 7.0  --  5.4  --   CREATININE 1.20*  --  1.59* 1.55* 1.74* 2.23*  --  2.92*  --   LATICACIDVEN 4.1* 1.4  --   --   --   --   --   --   --   VANCOTROUGH  --   --   --   --   --  55* 46*  --   --   VANCORANDOM  --   --   --   --   --   --   --   --  45    Estimated Creatinine Clearance: 35.6 mL/min (by C-G formula based on Cr of 2.92).    Allergies  Allergen Reactions  . Codeine Hives  . Sulfa Antibiotics Hives    Antimicrobials this admission: Zosyn 4/14 >> 4/14 vancomycin 4/14 >>  Cefepime 4/14 >> 4/17 Metronidazole 4/14 >> 4/17 Meropenem 4/17 >>  Dose adjustments this admission:   Microbiology results: 4/14 BCx: NGTD UCx: ESBL E coli MRSA PCR: positive  Thank you for allowing pharmacy to be a part of this patient's care.  Ulice Dash D, Pharm.D. Clinical Pharmacist 03/28/2016 2:26 PM

## 2016-03-29 ENCOUNTER — Inpatient Hospital Stay: Payer: Medicaid Other

## 2016-03-29 DIAGNOSIS — N179 Acute kidney failure, unspecified: Secondary | ICD-10-CM

## 2016-03-29 DIAGNOSIS — J189 Pneumonia, unspecified organism: Secondary | ICD-10-CM

## 2016-03-29 LAB — RENAL FUNCTION PANEL
Albumin: 2.2 g/dL — ABNORMAL LOW (ref 3.5–5.0)
Anion gap: 6 (ref 5–15)
BUN: 32 mg/dL — AB (ref 6–20)
CALCIUM: 7.9 mg/dL — AB (ref 8.9–10.3)
CO2: 24 mmol/L (ref 22–32)
CREATININE: 3.46 mg/dL — AB (ref 0.44–1.00)
Chloride: 105 mmol/L (ref 101–111)
GFR, EST AFRICAN AMERICAN: 16 mL/min — AB (ref >60)
GFR, EST NON AFRICAN AMERICAN: 14 mL/min — AB (ref >60)
Glucose, Bld: 83 mg/dL (ref 65–99)
Phosphorus: 5.4 mg/dL — ABNORMAL HIGH (ref 2.5–4.6)
Potassium: 4.2 mmol/L (ref 3.5–5.1)
SODIUM: 135 mmol/L (ref 135–145)

## 2016-03-29 LAB — CALCIUM, IONIZED: CALCIUM, IONIZED, SERUM: 4.3 mg/dL — AB (ref 4.5–5.6)

## 2016-03-29 LAB — BLOOD GAS, ARTERIAL
Acid-base deficit: 1.7 mmol/L (ref 0.0–2.0)
BICARBONATE: 28.2 meq/L — AB (ref 21.0–28.0)
MECHANICAL RATE: 22
MECHVT: 450 mL
O2 SAT: 93.6 %
PEEP/CPAP: 5 cmH2O
PO2 ART: 88 mmHg (ref 83.0–108.0)
Patient temperature: 37
RATE: 22 resp/min
pCO2 arterial: 81 mmHg (ref 32.0–48.0)
pH, Arterial: 7.15 — CL (ref 7.350–7.450)

## 2016-03-29 LAB — HEPARIN LEVEL (UNFRACTIONATED): Heparin Unfractionated: 0.46 IU/mL (ref 0.30–0.70)

## 2016-03-29 LAB — GLUCOSE, CAPILLARY
GLUCOSE-CAPILLARY: 73 mg/dL (ref 65–99)
GLUCOSE-CAPILLARY: 71 mg/dL (ref 65–99)
GLUCOSE-CAPILLARY: 76 mg/dL (ref 65–99)
GLUCOSE-CAPILLARY: 90 mg/dL (ref 65–99)
Glucose-Capillary: 78 mg/dL (ref 65–99)
Glucose-Capillary: 65 mg/dL (ref 65–99)

## 2016-03-29 LAB — CBC
HCT: 24.8 % — ABNORMAL LOW (ref 35.0–47.0)
HEMOGLOBIN: 7.7 g/dL — AB (ref 12.0–16.0)
MCH: 22.8 pg — ABNORMAL LOW (ref 26.0–34.0)
MCHC: 30.9 g/dL — AB (ref 32.0–36.0)
MCV: 73.6 fL — ABNORMAL LOW (ref 80.0–100.0)
Platelets: 184 10*3/uL (ref 150–440)
RBC: 3.36 MIL/uL — ABNORMAL LOW (ref 3.80–5.20)
RDW: 19.2 % — AB (ref 11.5–14.5)
WBC: 5.4 10*3/uL (ref 3.6–11.0)

## 2016-03-29 LAB — VANCOMYCIN, RANDOM: VANCOMYCIN RM: 37 ug/mL

## 2016-03-29 MED ORDER — DEXTROSE 50 % IV SOLN
25.0000 mL | INTRAVENOUS | Status: AC
  Administered 2016-03-29: 25 mL via INTRAVENOUS

## 2016-03-29 MED ORDER — METOCLOPRAMIDE HCL 5 MG/ML IJ SOLN: 10.0000 mg | Freq: Four times a day (QID) | INTRAMUSCULAR | Status: DC

## 2016-03-29 MED ORDER — LACTULOSE 10 GM/15ML PO SOLN
20.0000 g | Freq: Two times a day (BID) | ORAL | Status: DC
  Administered 2016-03-29 – 2016-03-31 (×6): 20 g via ORAL
  Filled 2016-03-29 (×6): qty 30

## 2016-03-29 MED ORDER — SODIUM CHLORIDE 0.9 % IV SOLN
500.0000 mg | INTRAVENOUS | Status: DC
  Administered 2016-03-30 – 2016-04-02 (×4): 500 mg via INTRAVENOUS
  Filled 2016-03-29 (×5): qty 0.5

## 2016-03-29 MED ORDER — DEXTROSE 50 % IV SOLN
INTRAVENOUS | Status: AC
  Filled 2016-03-29: qty 50

## 2016-03-29 MED ORDER — METOCLOPRAMIDE HCL 5 MG/ML IJ SOLN
5.0000 mg | Freq: Four times a day (QID) | INTRAMUSCULAR | Status: DC
  Administered 2016-03-29 – 2016-03-31 (×7): 5 mg via INTRAVENOUS
  Filled 2016-03-29 (×8): qty 2

## 2016-03-29 MED ORDER — CHLORHEXIDINE GLUCONATE CLOTH 2 % EX PADS
6.0000 | MEDICATED_PAD | Freq: Every day | CUTANEOUS | Status: AC
Start: 2016-03-29 — End: 2016-04-03
  Administered 2016-03-29 – 2016-04-02 (×4): 6 via TOPICAL

## 2016-03-29 MED ORDER — MUPIROCIN 2 % EX OINT
1.0000 "application " | TOPICAL_OINTMENT | Freq: Two times a day (BID) | CUTANEOUS | Status: AC
  Administered 2016-03-29 – 2016-04-02 (×10): 1 via NASAL
  Filled 2016-03-29: qty 22

## 2016-03-29 NOTE — Progress Notes (Signed)
Nutrition Follow-up  DOCUMENTATION CODES:   Morbid obesity  INTERVENTION:   -EN: per MD, Mungal in rounds this am, plan to adjust bowel regimen and add reglan with plan to start EN tomorrow via OG tube.  Will continue to assess.    NUTRITION DIAGNOSIS:   Inadequate oral intake related to acute illness as evidenced by NPO status.  GOAL:   Provide needs based on ASPEN/SCCM guidelines  MONITOR:   Vent status, Labs, Weight trends, I & O's  REASON FOR ASSESSMENT:   Ventilator    ASSESSMENT:    Pt remains intubated on the vent. OG tube to Low-intermittent suction. Nephrology following, poor candidate for dialysis.  Diet Order:   Day 4 NPO  Skin:  Reviewed, no issues  Gastrointestinal Profile: Last BM:  no BM documented Digestive System: CT abdomen negative, OG tube with 347mL out last night at 16:00    Labs: reviewed, P 5.4 worsening BUN and Cre Medications: SS novolog, Senokot, Lactulose, Reglan, Miralax, Protonix, Fentanyl and Versed stopped this am and off Levo   Weight Trend since Admission: Filed Weights   03/27/16 0541 03/28/16 0500 03/29/16 0600  Weight: 398 lb 2.4 oz (180.6 kg) 403 lb 7.1 oz (183 kg) 385 lb 9.4 oz (174.9 kg)     BMI:  Body mass index is 64.16 kg/(m^2).  Estimated Nutritional Needs:   Kcal:  1254-1425 kcals (22-25 kcals/kg IBW per ASPEN guidelines for BMI >50)  Protein:  143 g (2.5 g/kg)   Fluid:  >1.7 L  EDUCATION NEEDS:   Education needs no appropriate at this time  Dwyane Luo, RD, LDN Pager [phone] Weekend/On-Call Pager [phone]

## 2016-03-29 NOTE — Progress Notes (Signed)
Pharmacy Antibiotic Note  Tasha Long is a 60 y.o. female admitted on 03/25/2016 with respiratory distress ordered empiric abx and ESBL Ecoli.  Pharmacy has been consulted for vancomycin and Merrem   Plan: Repeat vancomycin level 37 mcg/mL. Continue to hold vancomycin. Will recheck random level with am labs of Thursday since level will likely be greater than 20 mcg/ml for another 30-36.    Will change to Merrem 500 mg IV q 24 hours since patient is in AKI and serum creatinine continues to increase. Will dose as if patient's CrCl <10 ml/min since Crcl can't accurately estimated renal function in AKI.   Height: 5\' 5"  (165.1 cm) Weight: (!) 385 lb 9.4 oz (174.9 kg) IBW/kg (Calculated) : 57  Temp (24hrs), Avg:97.7 F (36.5 C), Min:94.8 F (34.9 C), Max:98.4 F (36.9 C)   Recent Labs Lab 03/25/16 0719 03/25/16 1303  03/25/16 2228 03/26/16 0535 03/27/16 0352 03/27/16 1626 03/28/16 0349 03/28/16 0549 03/29/16 0552 03/29/16 1508  WBC 17.9*  --   --   --  5.4 7.0  --  5.4  --  5.4  --   CREATININE 1.20*  --   < > 1.55* 1.74* 2.23*  --  2.92*  --  3.46*  --   LATICACIDVEN 4.1* 1.4  --   --   --   --   --   --   --   --   --   VANCOTROUGH  --   --   --   --   --  55* 46*  --   --   --   --   VANCORANDOM  --   --   --   --   --   --   --   --  45  --  37  < > = values in this interval not displayed.  Estimated Creatinine Clearance: 29.2 mL/min (by C-G formula based on Cr of 3.46).    Allergies  Allergen Reactions  . Codeine Hives  . Sulfa Antibiotics Hives    Antimicrobials this admission: Zosyn 4/14 >> 4/14 vancomycin 4/14 >>  Cefepime 4/14 >> 4/17 Metronidazole 4/14 >> 4/17 Meropenem 4/17 >>  Dose adjustments this admission: Merrem 500 mg IV q24 hours 4/18   Microbiology results: 4/14 BCx: NGTD UCx: ESBL E coli MRSA PCR: positive  Thank you for allowing pharmacy to be a part of this patient's care.  Tasha Long D, Pharm.D. Clinical  Pharmacist 03/29/2016 6:44 PM

## 2016-03-29 NOTE — Plan of Care (Signed)
Problem: Phase I Progression Outcomes Goal: Oral Care per Protocol Outcome: Completed/Met Date Met:  03/29/16 Pt started on High oral protocol for mechanically ventilated patients

## 2016-03-29 NOTE — Plan of Care (Signed)
Problem: Phase I Progression Outcomes Goal: Patient tolerating nututrition at goal Outcome: Not Progressing Still putting out large amount of bile from OGT, unable to feed. Dietary will reevaluate tomorrow.

## 2016-03-29 NOTE — Progress Notes (Signed)
Pt in stable condition at this time. EEG completed today. Pt sedation has remained off since 0830 am this shift. No signs of waking up or desynchrony with the vent. Pt only has flexion responses to nailbed painful stimulus. Pt remains on heparin. Pt remains pain free using the CPOT scale.  Report given to oncoming RN.

## 2016-03-29 NOTE — Progress Notes (Signed)
[facility]  ROUNDING NOTE   Subjective:   Adopted daughter is at bedside.   UOP 470   Objective:  Vital signs in last 24 hours:  Temp:  [94.8 F (34.9 C)-98.8 F (37.1 C)] 98.2 F (36.8 C) (04/18 0900) Pulse Rate:  [57-92] 91 (04/18 0900) Resp:  [17-26] 22 (04/18 0900) BP: (109-141)/(45-58) 141/51 mmHg (04/18 0900) SpO2:  [90 %-100 %] 90 % (04/18 0900) FiO2 (%):  [40 %] 40 % (04/18 0810) Weight:  [174.9 kg (385 lb 9.4 oz)] 174.9 kg (385 lb 9.4 oz) (04/18 0600)  Weight change: -8.1 kg (-17 lb 13.7 oz) Filed Weights   03/27/16 0541 03/28/16 0500 03/29/16 0600  Weight: 180.6 kg (398 lb 2.4 oz) 183 kg (403 lb 7.1 oz) 174.9 kg (385 lb 9.4 oz)    Intake/Output: I/O last 3 completed shifts: In: 3605.2 [I.V.:2555.2; NG/GT:450; IV Piggyback:600] Out: 870 [Urine:520; Emesis/NG output:350]   Intake/Output this shift:  Total I/O In: 61.5 [I.V.:61.5] Out: -   Physical Exam: General: Critically ill  Head: ETT OGT  Eyes: Eyes closed  Neck: trachea midline  Lungs:  Coarse breath sounds, PRVC FiO2 50%  Heart: Regular rate and rhythm  Abdomen:  Soft, nontender, obese, no bowel soundss  Extremities: nonpitting peripheral edema.  Neurologic: Sedated, intubated  Skin: Erythema bilateral lower extremities  Access: vascath 4/17 Dr. Mortimer Fries    Basic Metabolic Panel:  Recent Labs Lab 03/25/16 2228 03/26/16 0535 03/27/16 0352 03/28/16 0349 03/29/16 0552  NA 134* 133* 134* 133* 135  K 4.2 4.0 4.1 4.1 4.2  CL 99* 99* 101 102 105  CO2 _0 GLUCOSE 97 86 90 79 83  BUN 22* 22* 24* 29* 32*  CREATININE 1.55* 1.74* 2.23* 2.92* 3.46*  CALCIUM 8.4* 8.3* 7.9* 8.0* 7.9*  MG  --   --  2.1  --   --   PHOS  --  4.6 5.5*  --  5.4*    Liver Function Tests:  Recent Labs Lab 03/25/16 0719 03/25/16 2228 03/28/16 0349 03/29/16 0552  AST _1 --   ALT 10* 12* 11*  --   ALKPHOS 45 31* 30*  --   BILITOT 0.4 0.6 1.0  --   PROT 8.2* 6.3* 6.3*  --    ALBUMIN 3.5 2.6* 2.4* 2.2*    Recent Labs Lab 03/25/16 0719  LIPASE 18   No results for input(s): AMMONIA in the last 168 hours.  CBC:  Recent Labs Lab 03/25/16 0719 03/26/16 0535 03/27/16 0352 03/28/16 0349 03/29/16 0552  WBC 17.9* 5.4 7.0 5.4 5.4  NEUTROABS 8.2*  --   --   --   --   HGB 9.8* 8.4* 8.2* 8.1* 7.7*  HCT 32.8* 27.0* 26.7* 25.7* 24.8*  MCV 76.6* 75.2* 76.2* 75.0* 73.6*  PLT 320 195 192 188 184    Cardiac Enzymes:  Recent Labs Lab 03/25/16 0719 03/25/16 1529 03/25/16 2228  TROPONINI 0.56* 0.81* 0.42*    BNP: Invalid input(s): POCBNP  CBG:  Recent Labs Lab 03/28/16 1203 03/28/16 1932 03/28/16 2355 03/29/16 0418 03/29/16 0743  GLUCAP 77 91 86 73 71    Microbiology: Results for orders placed or performed during the hospital encounter of 03/25/16  Culture, blood (routine x 2)     Status: None (Preliminary result)   Collection Time: 03/25/16  7:19 AM  Result Value Ref Range Status   Specimen Description BLOOD LEFT FOREARM  Final   Special Requests BOTTLES DRAWN  AEROBIC AND ANAEROBIC  1CC  Final   Culture NO GROWTH 4 DAYS  Final   Report Status PENDING  Incomplete  Culture, blood (routine x 2)     Status: None (Preliminary result)   Collection Time: 03/25/16  7:19 AM  Result Value Ref Range Status   Specimen Description BLOOD LEFT HAND  Final   Special Requests BOTTLES DRAWN AEROBIC AND ANAEROBIC  1CC  Final   Culture NO GROWTH 4 DAYS  Final   Report Status PENDING  Incomplete  MRSA PCR Screening     Status: Abnormal   Collection Time: 03/25/16 12:42 PM  Result Value Ref Range Status   MRSA by PCR POSITIVE (A) NEGATIVE Final    Comment:        The GeneXpert MRSA Assay (FDA approved for NASAL specimens only), is one component of a comprehensive MRSA colonization surveillance program. It is not intended to diagnose MRSA infection nor to guide or monitor treatment for MRSA infections. CRITICAL RESULT CALLED TO, READ BACK BY AND  VERIFIED WITH:  AMELIA BERRY AT 1644 03/25/16 SDR   Urine culture     Status: Abnormal   Collection Time: 03/26/16  1:02 AM  Result Value Ref Range Status   Specimen Description URINE, RANDOM  Final   Special Requests NONE  Final   Culture (A)  Final    20,000 COLONIES/mL ESCHERICHIA COLI Results Called to: AMELIA BERRY, RN  AT 1055 ON 03/28/16 BY CTJ. ESBL-EXTENDED SPECTRUM BETA LACTAMASE-THE ORGANISM IS RESISTANT TO PENICILLINS, CEPHALOSPORINS AND AZTREONAM ACCORDING TO CLSI M100-S15 VOL.[place].    Report Status 03/28/2016 FINAL  Final   Organism ID, Bacteria ESCHERICHIA COLI (A)  Final      Susceptibility   Escherichia coli - MIC*    AMPICILLIN >=32 RESISTANT Resistant     CEFAZOLIN >=64 RESISTANT Resistant     CEFTRIAXONE >=64 RESISTANT Resistant     CIPROFLOXACIN >=4 RESISTANT Resistant     GENTAMICIN <=1 SENSITIVE Sensitive     IMIPENEM <=0.25 SENSITIVE Sensitive     NITROFURANTOIN <=16 SENSITIVE Sensitive     TRIMETH/SULFA >=320 RESISTANT Resistant     AMPICILLIN/SULBACTAM 16 INTERMEDIATE Intermediate     PIP/TAZO <=4 SENSITIVE Sensitive     Extended ESBL POSITIVE Resistant     * 20,000 COLONIES/mL ESCHERICHIA COLI    Coagulation Studies: No results for input(s): LABPROT, INR in the last 72 hours.  Urinalysis: No results for input(s): COLORURINE, LABSPEC, PHURINE, GLUCOSEU, HGBUR, BILIRUBINUR, KETONESUR, PROTEINUR, UROBILINOGEN, NITRITE, LEUKOCYTESUR in the last 72 hours.  Invalid input(s): APPERANCEUR    Imaging: Dg Chest 1 View  03/28/2016  CLINICAL DATA:  OG tube placement EXAM: CHEST 1 VIEW COMPARISON:  Chest radiograph dated 03/28/2016 at 1626 hours FINDINGS: Endotracheal tube terminates 3 cm above the carina. Increased interstitial markings, corresponding to known tree-in-bud nodularity/multifocal pneumonia, better evaluated on CT. No definite pleural effusions or pneumothorax. Right subclavian venous catheter terminates in the upper SVC. Left IJ venous  catheter terminates in the low right atrium. Enteric tube courses below the diaphragm. IMPRESSION: Endotracheal tube terminates 3 cm above the carina. Enteric tube courses below the diaphragm. Additional stable support apparatus as above. Electronically Signed   By: Julian Hy M.D.   On: 03/28/2016 19:07   Dg Chest 1 View  03/28/2016  CLINICAL DATA:  Status post central line placement EXAM: CHEST 1 VIEW COMPARISON:  03/28/2016 FINDINGS: Endotracheal tube and nasogastric catheter are again noted and stable. Stable right subclavian central line  is noted in the proximal superior vena cava. A new left jugular catheter is noted extending deep into the right atrium. This could be withdrawn 2-3 cm. No pneumothorax is noted. Persistent infiltrative changes are noted in both lungs. Cardiac shadow is stable. IMPRESSION: New left jugular catheter extending deep into the right atrium. This could be withdrawn 2-3 cm. No pneumothorax is noted. The remainder of the exam is stable. Electronically Signed   By: Inez Catalina M.D.   On: 03/28/2016 16:44   Dg Chest 1 View  03/28/2016  CLINICAL DATA:  60 year old female with dyspnea. Subsequent encounter. EXAM: CHEST 1 VIEW COMPARISON:  03/27/2016. FINDINGS: Endotracheal tube with tip 2.8 cm above the carina. Right central line tip proximal superior vena cava level. Severe multi lobular bronchopneumonia throughout both lungs noted on recent CT not as well delineated on the present plain film exam. Pulmonary vascular congestion. Follow-up until clearance recommended to exclude underlying malignancy. Cardiomegaly. IMPRESSION: Severe multi lobular bronchopneumonia throughout both lungs noted on recent CT not as well delineated on the present plain film exam. Pulmonary vascular congestion. Electronically Signed   By: Genia Del M.D.   On: 03/28/2016 07:50   Dg Abd 1 View  03/28/2016  CLINICAL DATA:  OG tube placement EXAM: ABDOMEN - 1 VIEW COMPARISON:  CT abdomen pelvis  dated 03/26/2016 FINDINGS: Enteric tube terminates in the distal gastric body. IMPRESSION: Enteric tube terminates in the distal gastric body. Electronically Signed   By: Julian Hy M.D.   On: 03/28/2016 19:09   Ct Head Wo Contrast  03/28/2016  CLINICAL DATA:  Unresponsive, acute respiratory failure. EXAM: CT HEAD WITHOUT CONTRAST TECHNIQUE: Contiguous axial images were obtained from the base of the skull through the vertex without intravenous contrast. COMPARISON:  None. FINDINGS: Bony calvarium appears intact. Mild bilateral maxillary and ethmoid sinusitis is noted. Minimal diffuse cortical atrophy is noted. Mild chronic ischemic white matter disease is noted. No mass effect or midline shift is noted. Ventricular size is within normal limits. There is no evidence of mass lesion, hemorrhage or acute infarction. IMPRESSION: Minimal diffuse cortical atrophy. Mild chronic ischemic white matter disease. Mild bilateral maxillary and ethmoid sinusitis. No acute intracranial abnormality seen. Electronically Signed   By: Marijo Conception, M.D.   On: 03/28/2016 12:52     Medications:   . fentaNYL infusion INTRAVENOUS Stopped (03/29/16 0837)  . heparin 2,000 Units/hr (03/29/16 0900)  . midazolam (VERSED) infusion Stopped (03/29/16 0837)  . norepinephrine Stopped (03/28/16 2331)   . antiseptic oral rinse  7 mL Mouth Rinse 10 times per day  . budesonide (PULMICORT) nebulizer solution  0.25 mg Nebulization 4 times per day  . chlorhexidine gluconate (SAGE KIT)  15 mL Mouth Rinse BID  . Chlorhexidine Gluconate Cloth  6 each Topical Q0600  . fentaNYL (SUBLIMAZE) injection  50 mcg Intravenous Once  . insulin aspart  0-15 Units Subcutaneous 6 times per day  . ipratropium-albuterol  3 mL Nebulization Q6H  . levothyroxine  37.5 mcg Intravenous Daily  . meropenem (MERREM) IV  1 g Intravenous Q12H  . mupirocin ointment  1 application Nasal BID  . pantoprazole (PROTONIX) IV  40 mg Intravenous QHS  .  polyethylene glycol  17 g Oral BID  . senna-docusate  1 tablet Oral BID   sodium chloride, acetaminophen, albuterol, fentaNYL, midazolam, ondansetron (ZOFRAN) IV, sodium chloride flush  Assessment/ Plan:  Ms. Tasha Long is a 60 y.o. white female with chronic respiratory failure due to COPD, morbid obesity,  obstructive sleep apnea requiring CPAP, chronic severe bilateral lymphedema in the legs, bipolar disorder, anxiety, IBS, GERD, hypertension, hyperlipidemia, hypothyroidism, chronic stasis dermatiti , was admitted on 03/25/2016 with Acute respiratory distress.   1. Acute renal failure on chronic kidney disease stage III with baseline creatinine of 1.2, eGFR of 49: anuric. Secondary to ATN. With elevated vancomycin levels. On vasopressors. Not responsive to IV diuretics. Anuric. Overall prognosis poor. Discussed dialysis with patient, at this time do not think patient is a good candidate - Discussed case with Dr. Stevenson Clinch   3. Acute respiratory failure: intubated on mechanical ventilation. ABG reviewed. CXR with multilobar pneumonia - cefepime, metronidazole. Continue supportive care  3. Hypotension: septic shock requiring vasopressors.   4. Anemia: hemoglobin 8.1 - low threshold for epo.    LOS: [place], [place] 4/18/201710:37 AM

## 2016-03-29 NOTE — Progress Notes (Signed)
EEG completed, results pending. 

## 2016-03-29 NOTE — Plan of Care (Signed)
Problem: Phase I Progression Outcomes Goal: GIProphysixis Outcome: Completed/Met Date Met:  03/29/16 Pt on Protonix IVP

## 2016-03-29 NOTE — Progress Notes (Signed)
ANTICOAGULATION CONSULT NOTE - Initial Consult  Pharmacy Consult for Heparin Indication: pulmonary embolus  Allergies  Allergen Reactions  . Codeine Hives  . Sulfa Antibiotics Hives   Patient Measurements: Height: _0  (165.1 cm) Weight: (!) 403 lb 7.1 oz (183 kg) IBW/kg (Calculated) : 57 Heparin Dosing Weight: 93.6 kg  Vital Signs: Temp: 98.2 Long (36.8 C) (04/18 0500) BP: 109/51 mmHg (04/18 0500) Pulse Rate: 85 (04/18 0500)  Labs:  Recent Labs  03/27/16 0352 03/28/16 0349 03/28/16 1258 03/28/16 2152 03/29/16 0552  HGB 8.2* 8.1*  --   --  7.7*  HCT 26.7* 25.7*  --   --  24.8*  PLT 192 188  --   --  184  HEPARINUNFRC 0.53 0.27* 0.29* 0.56 0.46  CREATININE 2.23* 2.92*  --   --   --     Estimated Creatinine Clearance: 35.6 mL/min (by C-G formula based on Cr of 2.92).   Medical History: History reviewed. No pertinent past medical history.  Medications:  Scheduled:  . antiseptic oral rinse  7 mL Mouth Rinse 10 times per day  . budesonide (PULMICORT) nebulizer solution  0.25 mg Nebulization 4 times per day  . chlorhexidine gluconate (SAGE KIT)  15 mL Mouth Rinse BID  . fentaNYL (SUBLIMAZE) injection  50 mcg Intravenous Once  . insulin aspart  0-15 Units Subcutaneous 6 times per day  . ipratropium-albuterol  3 mL Nebulization Q6H  . levothyroxine  37.5 mcg Intravenous Daily  . meropenem (MERREM) IV  1 g Intravenous Q12H  . pantoprazole (PROTONIX) IV  40 mg Intravenous QHS  . polyethylene glycol  17 g Oral BID  . senna-docusate  1 tablet Oral BID   Infusions:  . fentaNYL infusion INTRAVENOUS 200 mcg/hr (03/29/16 0500)  . heparin 2,000 Units/hr (03/28/16 2018)  . midazolam (VERSED) infusion 2 mg/hr (03/29/16 0500)  . norepinephrine Stopped (03/28/16 2331)    Assessment: 60 y/o Long admitted with respiratory arrest ordered heparin for possible PE.   Goal of Therapy:  Heparin level 0.3-0.7 units/ml Monitor platelets by anticoagulation protocol: Yes   Plan:   Heparin level subtherapeutic. 1500 unit IV x 1 bolus and increase rate to 2000 units/hr. Will recheck level in 6 hours.  4/17 at 21:52  HL = 0.56. Will continue current dosing.  Will recheck HL with AM labs.   4/18 AM heparin level 0.46. Continue current regimen. Recheck with CBC tomorrow AM.  Eloise Harman, RPh Clinical Pharmacist 03/29/2016,6:18 AM

## 2016-03-29 NOTE — Plan of Care (Signed)
Problem: Phase I Progression Outcomes Goal: Hemodynamically stable Outcome: Progressing Pt levophed titrated off this shift

## 2016-03-29 NOTE — Progress Notes (Signed)
[facility] Progess Note    ASSESSMENT/PLAN    MAJOR EVENTS/TEST RESULTS: 04/14 TTE: pending.  04/14 LE venous US: negative.  03/25/16; abdomen US: negative, but incomplete exam.    INDWELLING DEVICES:: ETT 04/14 >>  R [place] CVL 04/14 >>   MICRO DATA: Urine 04/14 >> pending.  Resp 04/14 >> pending.  Blood 04/14 >> pending.  MRSA PCR positive.   PCT algorithm 04/14: 1.48  ANTIMICROBIALS:  Vanc 04/14 >>  Metronidazole 04/14 >>  Cefepime 04/14 >>   CXR reviewed images, ETT in position, continue right hilar prominence.  ---------------------------------------   ----------------------------------------   Name: Anvitha Lybrook MRN: [id number] DOB: March 05, 1956    ADMISSION DATE:  03/25/2016  VITAL SIGNS: Temp:  [94.8 F (34.9 C)-98.4 F (36.9 C)] 98.2 F (36.8 C) (04/18 0900) Pulse Rate:  [57-92] 91 (04/18 0900) Resp:  [17-26] 22 (04/18 0900) BP: (109-141)/(45-57) 141/51 mmHg (04/18 0900) SpO2:  [90 %-100 %] 95 % (04/18 1109) FiO2 (%):  [40 %] 40 % (04/18 1109) Weight:  [385 lb 9.4 oz (174.9 kg)] 385 lb 9.4 oz (174.9 kg) (04/18 0600) HEMODYNAMICS: CVP:  [10 mmHg-21 mmHg] 11 mmHg VENTILATOR SETTINGS: Vent Mode:  [-] PRVC FiO2 (%):  [40 %] 40 % Set Rate:  [26 bmp] 26 bmp Vt Set:  [500 mL] 500 mL PEEP:  [5 cmH20] 5 cmH20 INTAKE / OUTPUT:  Intake/Output Summary (Last 24 hours) at 03/29/16 1121 Last data filed at 03/29/16 0900  Gross per 24 hour  Intake 1659.73 ml  Output    820 ml  Net 839.73 ml   SUBJECTIVE:  Patient remains obtunded and on ventilator, sedation is off since this morning.  Patient does not follow any commands no purposeful movements.    PHYSICAL EXAMINATION: Physical Examination:   VS: BP 141/51 mmHg  Pulse 91  Temp(Src) 98.2 F (36.8 C) (Core (Comment))  Resp 22  Ht 5\' 5"  (1.651 m)  Wt 385 lb 9.4 oz (174.9 kg)  BMI 64.16 kg/m2  SpO2 95%  General Appearance: Very sickly appearing white  female Neuro:Atraumatic, normocephalic,Grimaces to pain HEENT: Pupils sluggishly reacting to light,no JVD appreciated, no discharge noted. Pulmonary: Clear bilaterally, no weezes, crackles, rhonchi noted   Cardiovascular Normal S1,S2.  NoMRG, RRR  Abdomen Obese, round, hypoactive bowel sounds Renal:  No costovertebral tenderness  GU: not performed Endocrine: No evident thyromegaly. Skin:   warm,, no ecchymosis ,cellulitis present , red Extremities: +3 pitting edema, warm to touch, red   LABS:   LABORATORY PANEL:   CBC  Recent Labs Lab 03/29/16 0552  WBC 5.4  HGB 7.7*  HCT 24.8*  PLT 184    Chemistries   Recent Labs Lab 03/27/16 0352 03/28/16 0349 03/29/16 0552  NA 134* 133* 135  K 4.1 4.1 4.2  CL 101 102 105  CO2 27 23 24   GLUCOSE 90 79 83  BUN 24* 29* 32*  CREATININE 2.23* 2.92* 3.46*  CALCIUM 7.9* 8.0* 7.9*  MG 2.1  --   --   PHOS 5.5*  --  5.4*  AST  --  29  --   ALT  --  11*  --   ALKPHOS  --  30*  --   BILITOT  --  1.0  --      Recent Labs Lab 03/28/16 0729 03/28/16 1203 03/28/16 1932 03/28/16 2355 03/29/16 0418 03/29/16 0743  GLUCAP 70 77 91 86 73 71    Recent Labs Lab 03/27/16 0500 03/27/16 1200 03/28/16  0500  PHART 7.24* 7.25* 7.40  PCO2ART 66* 59* 40  PO2ART 77* 69* 93    Recent Labs Lab 03/25/16 0719 03/25/16 2228 03/28/16 0349 03/29/16 0552  AST 30 30 29   --   ALT 10* 12* 11*  --   ALKPHOS 45 31* 30*  --   BILITOT 0.4 0.6 1.0  --   ALBUMIN 3.5 2.6* 2.4* 2.2*    Cardiac Enzymes  Recent Labs Lab 03/25/16 2228  TROPONINI 0.42*    RADIOLOGY:  Dg Chest 1 View  03/28/2016  CLINICAL DATA:  OG tube placement EXAM: CHEST 1 VIEW COMPARISON:  Chest radiograph dated 03/28/2016 at 1626 hours FINDINGS: Endotracheal tube terminates 3 cm above the carina. Increased interstitial markings, corresponding to known tree-in-bud nodularity/multifocal pneumonia, better evaluated on CT. No definite pleural effusions or pneumothorax.  Right subclavian venous catheter terminates in the upper SVC. Left IJ venous catheter terminates in the low right atrium. Enteric tube courses below the diaphragm. IMPRESSION: Endotracheal tube terminates 3 cm above the carina. Enteric tube courses below the diaphragm. Additional stable support apparatus as above. Electronically Signed   By: Julian Hy M.D.   On: 03/28/2016 19:07   Dg Chest 1 View  03/28/2016  CLINICAL DATA:  Status post central line placement EXAM: CHEST 1 VIEW COMPARISON:  03/28/2016 FINDINGS: Endotracheal tube and nasogastric catheter are again noted and stable. Stable right subclavian central line is noted in the proximal superior vena cava. A new left jugular catheter is noted extending deep into the right atrium. This could be withdrawn 2-3 cm. No pneumothorax is noted. Persistent infiltrative changes are noted in both lungs. Cardiac shadow is stable. IMPRESSION: New left jugular catheter extending deep into the right atrium. This could be withdrawn 2-3 cm. No pneumothorax is noted. The remainder of the exam is stable. Electronically Signed   By: Inez Catalina M.D.   On: 03/28/2016 16:44   Dg Chest 1 View  03/28/2016  CLINICAL DATA:  60 year old female with dyspnea. Subsequent encounter. EXAM: CHEST 1 VIEW COMPARISON:  03/27/2016. FINDINGS: Endotracheal tube with tip 2.8 cm above the carina. Right central line tip proximal superior vena cava level. Severe multi lobular bronchopneumonia throughout both lungs noted on recent CT not as well delineated on the present plain film exam. Pulmonary vascular congestion. Follow-up until clearance recommended to exclude underlying malignancy. Cardiomegaly. IMPRESSION: Severe multi lobular bronchopneumonia throughout both lungs noted on recent CT not as well delineated on the present plain film exam. Pulmonary vascular congestion. Electronically Signed   By: Genia Del M.D.   On: 03/28/2016 07:50   Dg Abd 1 View  03/28/2016  CLINICAL  DATA:  OG tube placement EXAM: ABDOMEN - 1 VIEW COMPARISON:  CT abdomen pelvis dated 03/26/2016 FINDINGS: Enteric tube terminates in the distal gastric body. IMPRESSION: Enteric tube terminates in the distal gastric body. Electronically Signed   By: Julian Hy M.D.   On: 03/28/2016 19:09   Ct Head Wo Contrast  03/28/2016  CLINICAL DATA:  Unresponsive, acute respiratory failure. EXAM: CT HEAD WITHOUT CONTRAST TECHNIQUE: Contiguous axial images were obtained from the base of the skull through the vertex without intravenous contrast. COMPARISON:  None. FINDINGS: Bony calvarium appears intact. Mild bilateral maxillary and ethmoid sinusitis is noted. Minimal diffuse cortical atrophy is noted. Mild chronic ischemic white matter disease is noted. No mass effect or midline shift is noted. Ventricular size is within normal limits. There is no evidence of mass lesion, hemorrhage or acute infarction. IMPRESSION: Minimal diffuse  cortical atrophy. Mild chronic ischemic white matter disease. Mild bilateral maxillary and ethmoid sinusitis. No acute intracranial abnormality seen. Electronically Signed   By: Marijo Conception, M.D.   On: 03/28/2016 12:52    PT PROFILE:  17 F in chronically very poor health developed abdominal pain and EMS was dispatched. Suffered respiratory arrest and brief cardiac arrest in the transfer from her bed to wheelchair and into ambulance. Intubated and transported to ED. Suffered brief PEA arrest again in ICU shortly after arrival   PULMONARY A: Respiratory arrest COPD OSA Smoker Concern for PE given numerous RFs - no CTA given AKI.  P:  Continue anticoagulation empirically.  Continue vent support, not weaning candidate at this time.   Continue Albuterol, pulmicort  Nebs.   CARDIOVASCULAR A:  Cardiac arrest due to respiratory arrest Hypertension Hyperlipidemia  P:   Echo indicative of Four-chamber dilated patient, with moderate left ventricle  systolic  dysfunction and mild diastolic dysfunction with diffuse  hypokinesis   MAP goal > 65 mmHg CVL placed  RENAL A:  AKI, increased creatinine today.  Hyperkalemia Very poor candidate for HD P:  Monitor I/O, monitor kidney function.  Continue IVF. Nephrology following the patient  CRRT  Per nephrology  GASTROINTESTINAL A:  Morbid obesity Abdominal pain P:  SUP: IV PPI TF protocol   . HEMATOLOGIC A:  Anemia with microcytosis - no overt bleeding P:  DVT px: full dose heparin heparin Monitor CBC intermittently Transfuse per usual guidelines  INFECTIOUS A:  Possible abdominal sepsis Possible PNA Possible LE cellulitis with prior hx of MRSA cellulitis ESBL in the urine P:  Monitor temp, WBC count Micro and abx as above Continue vanc and meropenem for now  ENDOCRINE A:  Suspect insulin resistance though no history of DM Glucose controlled.  Hypothyroidism P:  IV L-thyroxine. Transition to enteral when able SSI - mod scale  NEUROLOGIC A:  Post arrest anoxic encephalopathy ICU associated discomfort P:  RASS goal: -2, -3 PAD protocol Daily WUA CT head was negative for any acute anomaly   Follow EEG      Marianna Cid,AG-ACNP Pulmonary & Critical Care   Note: Dr. Stevenson Clinch and myself has extensive discussion with the family regarding the poor prognosis of the patient.  Will obtain an EEG and will discuss with the family in 1-2 days.  Family is well aware of the poor prognosis and want to keep her full code for now.

## 2016-03-30 ENCOUNTER — Encounter: Payer: Self-pay | Admitting: *Deleted

## 2016-03-30 DIAGNOSIS — R4182 Altered mental status, unspecified: Secondary | ICD-10-CM | POA: Insufficient documentation

## 2016-03-30 DIAGNOSIS — R401 Stupor: Secondary | ICD-10-CM

## 2016-03-30 LAB — CBC
HEMATOCRIT: 24.4 % — AB (ref 35.0–47.0)
Hemoglobin: 7.6 g/dL — ABNORMAL LOW (ref 12.0–16.0)
MCH: 23.4 pg — AB (ref 26.0–34.0)
MCHC: 31.1 g/dL — ABNORMAL LOW (ref 32.0–36.0)
MCV: 75.2 fL — AB (ref 80.0–100.0)
Platelets: 190 10*3/uL (ref 150–440)
RBC: 3.24 MIL/uL — AB (ref 3.80–5.20)
RDW: 19.4 % — ABNORMAL HIGH (ref 11.5–14.5)
WBC: 6.5 10*3/uL (ref 3.6–11.0)

## 2016-03-30 LAB — GLUCOSE, CAPILLARY
GLUCOSE-CAPILLARY: 75 mg/dL (ref 65–99)
GLUCOSE-CAPILLARY: 98 mg/dL (ref 65–99)
GLUCOSE-CAPILLARY: 83 mg/dL (ref 65–99)
GLUCOSE-CAPILLARY: 84 mg/dL (ref 65–99)
Glucose-Capillary: 69 mg/dL (ref 65–99)
Glucose-Capillary: 78 mg/dL (ref 65–99)
Glucose-Capillary: 89 mg/dL (ref 65–99)

## 2016-03-30 LAB — RENAL FUNCTION PANEL
Albumin: 2.1 g/dL — ABNORMAL LOW (ref 3.5–5.0)
Anion gap: 9 (ref 5–15)
BUN: 34 mg/dL — ABNORMAL HIGH (ref 6–20)
CHLORIDE: 105 mmol/L (ref 101–111)
CO2: 22 mmol/L (ref 22–32)
Calcium: 8.1 mg/dL — ABNORMAL LOW (ref 8.9–10.3)
Creatinine, Ser: 4.12 mg/dL — ABNORMAL HIGH (ref 0.44–1.00)
GFR, EST AFRICAN AMERICAN: 13 mL/min — AB (ref >60)
GFR, EST NON AFRICAN AMERICAN: 11 mL/min — AB (ref >60)
Glucose, Bld: 77 mg/dL (ref 65–99)
POTASSIUM: 4.4 mmol/L (ref 3.5–5.1)
Phosphorus: 5.6 mg/dL — ABNORMAL HIGH (ref 2.5–4.6)
Sodium: 136 mmol/L (ref 135–145)

## 2016-03-30 LAB — CULTURE, BLOOD (ROUTINE X 2)
CULTURE: NO GROWTH
CULTURE: NO GROWTH

## 2016-03-30 LAB — HEPARIN LEVEL (UNFRACTIONATED): Heparin Unfractionated: 0.37 IU/mL (ref 0.30–0.70)

## 2016-03-30 MED ORDER — PRO-STAT SUGAR FREE PO LIQD
60.0000 mL | Freq: Three times a day (TID) | ORAL | Status: DC
  Administered 2016-03-30 – 2016-03-31 (×3): 60 mL

## 2016-03-30 MED ORDER — VITAL HIGH PROTEIN PO LIQD
1000.0000 mL | ORAL | Status: DC
  Administered 2016-03-30: 1000 mL

## 2016-03-30 NOTE — Consult Note (Signed)
Reason for Consult:Unresponsive s/p arrest Referring Physician: Alva Garnet  CC: Unresponsive s/p arrest  HPI: Zehava Halcyon Heck is an 60 y.o. female admitted due to respiratory arrest. Suffered respiratory arrest and brief cardiac arrest in the transfer from her bed to wheelchair and into ambulance. Intubated and transported to ED. Suffered brief PEA arrest again in ICU shortly after arrival.  Has had multiple episodes of desaturation since admission as well.      Past medical history: HTN, hyperlipidemia, COPD, morbid obesity, hypothyroidism  History reviewed. No pertinent past surgical history.  Family history: Two daughters alive and well.   Social History:  reports that she has quit smoking. She does not have any smokeless tobacco history on file. Her alcohol and drug histories are not on file.  Allergies  Allergen Reactions  . Codeine Hives  . Sulfa Antibiotics Hives    Medications:  I have reviewed the patient's current medications. Prior to Admission:  No prescriptions prior to admission   Scheduled: . antiseptic oral rinse  7 mL Mouth Rinse 10 times per day  . budesonide (PULMICORT) nebulizer solution  0.25 mg Nebulization 4 times per day  . chlorhexidine gluconate (SAGE KIT)  15 mL Mouth Rinse BID  . Chlorhexidine Gluconate Cloth  6 each Topical Q0600  . fentaNYL (SUBLIMAZE) injection  50 mcg Intravenous Once  . insulin aspart  0-15 Units Subcutaneous 6 times per day  . ipratropium-albuterol  3 mL Nebulization Q6H  . lactulose  20 g Oral BID  . levothyroxine  37.5 mcg Intravenous Daily  . meropenem (MERREM) IV  500 mg Intravenous Q24H  . metoCLOPramide (REGLAN) injection  5 mg Intravenous Q6H  . mupirocin ointment  1 application Nasal BID  . pantoprazole (PROTONIX) IV  40 mg Intravenous QHS  . polyethylene glycol  17 g Oral BID  . senna-docusate  1 tablet Oral BID    ROS: Unable to obtain secondary to mental status  Physical Examination: Blood pressure  119/49, pulse 82, temperature 97.3 F (36.3 C), temperature source Core (Comment), resp. rate 21, height _0  (1.651 m), weight 177.3 kg (390 lb 14 oz), SpO2 97 %.  HEENT-  Normocephalic, no lesions, without obvious abnormality.  Normal external eye and conjunctiva.  Normal TM's bilaterally.  Normal auditory canals and external ears. Normal external nose, mucus membranes and septum.  Normal pharynx. Cardiovascular- S1, S2 normal, pulses palpable throughout   Lungs- chest clear, no wheezing, rales, normal symmetric air entry Abdomen- soft, non-tender; bowel sounds normal; no masses,  no organomegaly Extremities- extensive edema and swelling in the lower extremities Lymph-no adenopathy palpable Musculoskeletal-no joint tenderness, deformity or swelling Skin-erythema below the knees  Neurological Examination Mental Status: Patient does not respond to verbal stimuli.  With deep sternal rub move right upper extemity.  Does not follow commands.  No verbalizations noted.  Cranial Nerves: II: patient does not respond confrontation bilaterally, pupils right 2 mm, left 2 mm,and unreactive bilaterally III,IV,VI: doll's response incomplete bilaterally.  V,VII: corneal reflex reduced bilaterally  VIII: patient does not respond to verbal stimuli IX,X: gag reflex absent, XI: trapezius strength unable to test bilaterally XII: tongue strength unable to test Motor: Extremities flaccid throughout.  No spontaneous movement noted.  No purposeful movements noted. Sensory: Moves all extremities with noxious stimuli to each extremity Deep Tendon Reflexes:  1+ in the upper extremities Plantars: mute bilaterally Cerebellar: Unable to perform   Laboratory Studies:   Basic Metabolic Panel:  Recent Labs Lab 03/26/16 0535 03/27/16 0352 03/28/16  0814 03/29/16 0552 03/30/16 0401  NA 133* 134* 133* 135 136  K 4.0 4.1 4.1 4.2 4.4  CL 99* 101 102 105 105  CO2 _0 GLUCOSE 86 90 79 83 77   BUN 22* 24* 29* 32* 34*  CREATININE 1.74* 2.23* 2.92* 3.46* 4.12*  CALCIUM 8.3* 7.9* 8.0* 7.9* 8.1*  MG  --  2.1  --   --   --   PHOS 4.6 5.5*  --  5.4* 5.6*    Liver Function Tests:  Recent Labs Lab 03/25/16 0719 03/25/16 2228 03/28/16 0349 03/29/16 0552 03/30/16 0401  AST _1 --   --   ALT 10* 12* 11*  --   --   ALKPHOS 45 31* 30*  --   --   BILITOT 0.4 0.6 1.0  --   --   PROT 8.2* 6.3* 6.3*  --   --   ALBUMIN 3.5 2.6* 2.4* 2.2* 2.1*    Recent Labs Lab 03/25/16 0719  LIPASE 18   No results for input(s): AMMONIA in the last 168 hours.  CBC:  Recent Labs Lab 03/25/16 0719 03/26/16 0535 03/27/16 0352 03/28/16 0349 03/29/16 0552 03/30/16 0401  WBC 17.9* 5.4 7.0 5.4 5.4 6.5  NEUTROABS 8.2*  --   --   --   --   --   HGB 9.8* 8.4* 8.2* 8.1* 7.7* 7.6*  HCT 32.8* 27.0* 26.7* 25.7* 24.8* 24.4*  MCV 76.6* 75.2* 76.2* 75.0* 73.6* 75.2*  PLT 320 195 192 188 184 190    Cardiac Enzymes:  Recent Labs Lab 03/25/16 0719 03/25/16 1529 03/25/16 2228  TROPONINI 0.56* 0.81* 0.42*    BNP: Invalid input(s): POCBNP  CBG:  Recent Labs Lab 03/29/16 2043 03/30/16 0004 03/30/16 0410 03/30/16 0719 03/30/16 1141  GLUCAP 90 69 78 83 51    Microbiology: Results for orders placed or performed during the hospital encounter of 03/25/16  Culture, blood (routine x 2)     Status: None   Collection Time: 03/25/16  7:19 AM  Result Value Ref Range Status   Specimen Description BLOOD LEFT FOREARM  Final   Special Requests BOTTLES DRAWN AEROBIC AND ANAEROBIC  1CC  Final   Culture NO GROWTH 5 DAYS  Final   Report Status 03/30/2016 FINAL  Final  Culture, blood (routine x 2)     Status: None   Collection Time: 03/25/16  7:19 AM  Result Value Ref Range Status   Specimen Description BLOOD LEFT HAND  Final   Special Requests BOTTLES DRAWN AEROBIC AND ANAEROBIC  1CC  Final   Culture NO GROWTH 5 DAYS  Final   Report Status 03/30/2016 FINAL  Final  MRSA PCR Screening      Status: Abnormal   Collection Time: 03/25/16 12:42 PM  Result Value Ref Range Status   MRSA by PCR POSITIVE (A) NEGATIVE Final    Comment:        The GeneXpert MRSA Assay (FDA approved for NASAL specimens only), is one component of a comprehensive MRSA colonization surveillance program. It is not intended to diagnose MRSA infection nor to guide or monitor treatment for MRSA infections. CRITICAL RESULT CALLED TO, READ BACK BY AND VERIFIED WITH:  AMELIA BERRY AT 1644 03/25/16 SDR   Urine culture     Status: Abnormal   Collection Time: 03/26/16  1:02 AM  Result Value Ref Range Status   Specimen Description URINE, RANDOM  Final   Special Requests NONE  Final  Culture (A)  Final    20,000 COLONIES/mL ESCHERICHIA COLI Results Called to: AMELIA BERRY, RN  AT 1055 ON 03/28/16 BY CTJ. ESBL-EXTENDED SPECTRUM BETA LACTAMASE-THE ORGANISM IS RESISTANT TO PENICILLINS, CEPHALOSPORINS AND AZTREONAM ACCORDING TO CLSI M100-S15 VOL.[place].    Report Status 03/28/2016 FINAL  Final   Organism ID, Bacteria ESCHERICHIA COLI (A)  Final      Susceptibility   Escherichia coli - MIC*    AMPICILLIN >=32 RESISTANT Resistant     CEFAZOLIN >=64 RESISTANT Resistant     CEFTRIAXONE >=64 RESISTANT Resistant     CIPROFLOXACIN >=4 RESISTANT Resistant     GENTAMICIN <=1 SENSITIVE Sensitive     IMIPENEM <=0.25 SENSITIVE Sensitive     NITROFURANTOIN <=16 SENSITIVE Sensitive     TRIMETH/SULFA >=320 RESISTANT Resistant     AMPICILLIN/SULBACTAM 16 INTERMEDIATE Intermediate     PIP/TAZO <=4 SENSITIVE Sensitive     Extended ESBL POSITIVE Resistant     * 20,000 COLONIES/mL ESCHERICHIA COLI    Coagulation Studies: No results for input(s): LABPROT, INR in the last 72 hours.  Urinalysis:  Recent Labs Lab 03/25/16 0745 03/26/16 0102  COLORURINE YELLOW* YELLOW*  LABSPEC 1.020 1.014  PHURINE 5.0 5.0  GLUCOSEU NEGATIVE NEGATIVE  HGBUR NEGATIVE NEGATIVE  BILIRUBINUR NEGATIVE NEGATIVE  KETONESUR  NEGATIVE TRACE*  PROTEINUR 100* 100*  NITRITE POSITIVE* NEGATIVE  LEUKOCYTESUR NEGATIVE NEGATIVE    Lipid Panel:     Component Value Date/Time   TRIG 45 03/27/2016 0050    HgbA1C: No results found for: HGBA1C  Urine Drug Screen:  No results found for: LABOPIA, COCAINSCRNUR, LABBENZ, AMPHETMU, THCU, LABBARB  Alcohol Level: No results for input(s): ETH in the last 168 hours.  Other results: EKG: normal sinus rhythm at 70 bpm.  Imaging: Dg Chest 1 View  03/28/2016  CLINICAL DATA:  OG tube placement EXAM: CHEST 1 VIEW COMPARISON:  Chest radiograph dated 03/28/2016 at 1626 hours FINDINGS: Endotracheal tube terminates 3 cm above the carina. Increased interstitial markings, corresponding to known tree-in-bud nodularity/multifocal pneumonia, better evaluated on CT. No definite pleural effusions or pneumothorax. Right subclavian venous catheter terminates in the upper SVC. Left IJ venous catheter terminates in the low right atrium. Enteric tube courses below the diaphragm. IMPRESSION: Endotracheal tube terminates 3 cm above the carina. Enteric tube courses below the diaphragm. Additional stable support apparatus as above. Electronically Signed   By: Julian Hy M.D.   On: 03/28/2016 19:07   Dg Chest 1 View  03/28/2016  CLINICAL DATA:  Status post central line placement EXAM: CHEST 1 VIEW COMPARISON:  03/28/2016 FINDINGS: Endotracheal tube and nasogastric catheter are again noted and stable. Stable right subclavian central line is noted in the proximal superior vena cava. A new left jugular catheter is noted extending deep into the right atrium. This could be withdrawn 2-3 cm. No pneumothorax is noted. Persistent infiltrative changes are noted in both lungs. Cardiac shadow is stable. IMPRESSION: New left jugular catheter extending deep into the right atrium. This could be withdrawn 2-3 cm. No pneumothorax is noted. The remainder of the exam is stable. Electronically Signed   By: Inez Catalina  M.D.   On: 03/28/2016 16:44   Dg Abd 1 View  03/28/2016  CLINICAL DATA:  OG tube placement EXAM: ABDOMEN - 1 VIEW COMPARISON:  CT abdomen pelvis dated 03/26/2016 FINDINGS: Enteric tube terminates in the distal gastric body. IMPRESSION: Enteric tube terminates in the distal gastric body. Electronically Signed   By: Henderson Newcomer.D.  On: 03/28/2016 19:09     Assessment/Plan: 60 year old female s/p arrest.  Has not returned to baseline mental status.  Does not fulfill criteria for brain death clinically but has evidence of multi end organ damage.  Head CT personally reviewed and shows no acute changes.  EEG is slow and of low voltage and shows no epileptiform activity.  At this time patient does not fulfill criteria for brain death.  Do not suspect any significant improvement in the short term with prognosis for functional recovery being low.  Discussed with multiple family members today with decision to be made about whether they wish to proceed with trach and PEG.  Spent 30 minutes in discussion.  They are in further discussion as a family.  All questions addressed.    Alexis Goodell, MD Neurology [phone] 03/30/2016, 3:16 PM

## 2016-03-30 NOTE — Progress Notes (Signed)
ANTICOAGULATION CONSULT NOTE - Initial Consult  Pharmacy Consult for Heparin Indication: pulmonary embolus  Allergies  Allergen Reactions  . Codeine Hives  . Sulfa Antibiotics Hives   Patient Measurements: Height: 5' 5"  (165.1 cm) Weight: (!) 390 lb 14 oz (177.3 kg) IBW/kg (Calculated) : 57 Heparin Dosing Weight: 93.6 kg  Vital Signs: Temp: 97.3 F (36.3 C) (04/19 0600) BP: 117/44 mmHg (04/19 0600) Pulse Rate: 80 (04/19 0600)  Labs:  Recent Labs  03/28/16 0349  03/28/16 2152 03/29/16 0552 03/30/16 0401  HGB 8.1*  --   --  7.7* 7.6*  HCT 25.7*  --   --  24.8* 24.4*  PLT 188  --   --  184 190  HEPARINUNFRC 0.27*  < > 0.56 0.46 0.37  CREATININE 2.92*  --   --  3.46* 4.12*  < > = values in this interval not displayed.  Estimated Creatinine Clearance: 24.7 mL/min (by C-G formula based on Cr of 4.12).   Medical History: History reviewed. No pertinent past medical history.  Medications:  Scheduled:  . antiseptic oral rinse  7 mL Mouth Rinse 10 times per day  . budesonide (PULMICORT) nebulizer solution  0.25 mg Nebulization 4 times per day  . chlorhexidine gluconate (SAGE KIT)  15 mL Mouth Rinse BID  . Chlorhexidine Gluconate Cloth  6 each Topical Q0600  . dextrose      . fentaNYL (SUBLIMAZE) injection  50 mcg Intravenous Once  . insulin aspart  0-15 Units Subcutaneous 6 times per day  . ipratropium-albuterol  3 mL Nebulization Q6H  . lactulose  20 g Oral BID  . levothyroxine  37.5 mcg Intravenous Daily  . meropenem (MERREM) IV  500 mg Intravenous Q24H  . metoCLOPramide (REGLAN) injection  5 mg Intravenous Q6H  . mupirocin ointment  1 application Nasal BID  . pantoprazole (PROTONIX) IV  40 mg Intravenous QHS  . polyethylene glycol  17 g Oral BID  . senna-docusate  1 tablet Oral BID   Infusions:  . fentaNYL infusion INTRAVENOUS Stopped (03/29/16 0837)  . heparin 2,000 Units/hr (03/30/16 0015)  . midazolam (VERSED) infusion Stopped (03/29/16 0837)  .  norepinephrine Stopped (03/28/16 2331)    Assessment: 60 y/o F admitted with respiratory arrest ordered heparin for possible PE.   Goal of Therapy:  Heparin level 0.3-0.7 units/ml Monitor platelets by anticoagulation protocol: Yes   Plan:  Heparin level subtherapeutic. 1500 unit IV x 1 bolus and increase rate to 2000 units/hr. Will recheck level in 6 hours.  4/17 at 21:52  HL = 0.56. Will continue current dosing.  Will recheck HL with AM labs.   4/18 AM heparin level 0.46. Continue current regimen. Recheck with CBC tomorrow AM.  4/19 AM heparin level 0.37. Continue current regimen. Recheck with CBC tomorrow AM.   Eloise Harman, RPh Clinical Pharmacist 03/30/2016,6:33 AM

## 2016-03-30 NOTE — Progress Notes (Signed)
Pallaitive Care Update  I am aware of consult. Dr Abigail Butts had spoken to me about consult earlier.  I see that pt's daughter feels pt is responding to commands.   I have also noted that a discussion was held between Critical Care Team and family earlier today and they stated that they will need more time to decide on further treatment options and whether or not they would agree with comfort care or else tracheostomy, etc.  I will check back in the morning and talk with critical care team before calling or talking with family to make sure that the timing and topics I will bring up will be appropriate and complimentary to conversations that have already taken place.    Colleen Can, MD Palliative Care

## 2016-03-30 NOTE — Progress Notes (Signed)
[facility]  ROUNDING NOTE   Subjective:   Daughter states that patient is following commands for her. Off sedation for over 24 hours. EEG done yesterday. Off vasopressors.   UOP 800 (470)   Objective:  Vital signs in last 24 hours:  Temp:  [97.3 F (36.3 C)-98.6 F (37 C)] 97.3 F (36.3 C) (04/19 0800) Pulse Rate:  [80-123] 82 (04/19 0800) Resp:  [18-26] 21 (04/19 0800) BP: (117-141)/(44-73) 119/49 mmHg (04/19 0800) SpO2:  [79 %-99 %] 97 % (04/19 0838) FiO2 (%):  [35 %-40 %] 35 % (04/19 0838) Weight:  [177.3 kg (390 lb 14 oz)] 177.3 kg (390 lb 14 oz) (04/19 0438)  Weight change: 2.4 kg (5 lb 4.7 oz) Filed Weights   03/28/16 0500 03/29/16 0600 03/30/16 0438  Weight: 183 kg (403 lb 7.1 oz) 174.9 kg (385 lb 9.4 oz) 177.3 kg (390 lb 14 oz)    Intake/Output: I/O last 3 completed shifts: In: 1358.3 [I.V.:1158.3; IV KCLEXNTZG:017] Out: 2050 [Urine:1150; Emesis/NG output:900]   Intake/Output this shift:  Total I/O In: 20 [I.V.:20] Out: -   Physical Exam: General: Critically ill  Head: ETT OGT  Eyes: Eyes closed  Neck: trachea midline  Lungs:  Coarse breath sounds, PRVC FiO2 35%  Heart: Regular rate and rhythm  Abdomen:  Soft, nontender, obese, no bowel soundss  Extremities: nonpitting peripheral edema.  Neurologic: Sedated, intubated  Skin: Erythema bilateral lower extremities  Access: vascath 4/17 Dr. Mortimer Fries    Basic Metabolic Panel:  Recent Labs Lab 03/26/16 0535 03/27/16 0352 03/28/16 0349 03/29/16 0552 03/30/16 0401  NA 133* 134* 133* 135 136  K 4.0 4.1 4.1 4.2 4.4  CL 99* 101 102 105 105  CO2 30 27 23 24 22   GLUCOSE 86 90 79 83 77  BUN 22* 24* 29* 32* 34*  CREATININE 1.74* 2.23* 2.92* 3.46* 4.12*  CALCIUM 8.3* 7.9* 8.0* 7.9* 8.1*  MG  --  2.1  --   --   --   PHOS 4.6 5.5*  --  5.4* 5.6*    Liver Function Tests:  Recent Labs Lab 03/25/16 0719 03/25/16 2228 03/28/16 0349 03/29/16 0552 03/30/16 0401  AST 30 30 29   --   --    ALT 10* 12* 11*  --   --   ALKPHOS 45 31* 30*  --   --   BILITOT 0.4 0.6 1.0  --   --   PROT 8.2* 6.3* 6.3*  --   --   ALBUMIN 3.5 2.6* 2.4* 2.2* 2.1*    Recent Labs Lab 03/25/16 0719  LIPASE 18   No results for input(s): AMMONIA in the last 168 hours.  CBC:  Recent Labs Lab 03/25/16 0719 03/26/16 0535 03/27/16 0352 03/28/16 0349 03/29/16 0552 03/30/16 0401  WBC 17.9* 5.4 7.0 5.4 5.4 6.5  NEUTROABS 8.2*  --   --   --   --   --   HGB 9.8* 8.4* 8.2* 8.1* 7.7* 7.6*  HCT 32.8* 27.0* 26.7* 25.7* 24.8* 24.4*  MCV 76.6* 75.2* 76.2* 75.0* 73.6* 75.2*  PLT 320 195 192 188 184 190    Cardiac Enzymes:  Recent Labs Lab 03/25/16 0719 03/25/16 1529 03/25/16 2228  TROPONINI 0.56* 0.81* 0.42*    BNP: Invalid input(s): POCBNP  CBG:  Recent Labs Lab 03/29/16 2004 03/29/16 2043 03/30/16 0004 03/30/16 0410 03/30/16 0719  GLUCAP 65 90 63 78 51    Microbiology: Results for orders placed or performed during the hospital encounter of 03/25/16  Culture, blood (routine x 2)     Status: None   Collection Time: 03/25/16  7:19 AM  Result Value Ref Range Status   Specimen Description BLOOD LEFT FOREARM  Final   Special Requests BOTTLES DRAWN AEROBIC AND ANAEROBIC  1CC  Final   Culture NO GROWTH 5 DAYS  Final   Report Status 03/30/2016 FINAL  Final  Culture, blood (routine x 2)     Status: None   Collection Time: 03/25/16  7:19 AM  Result Value Ref Range Status   Specimen Description BLOOD LEFT HAND  Final   Special Requests BOTTLES DRAWN AEROBIC AND ANAEROBIC  1CC  Final   Culture NO GROWTH 5 DAYS  Final   Report Status 03/30/2016 FINAL  Final  MRSA PCR Screening     Status: Abnormal   Collection Time: 03/25/16 12:42 PM  Result Value Ref Range Status   MRSA by PCR POSITIVE (A) NEGATIVE Final    Comment:        The GeneXpert MRSA Assay (FDA approved for NASAL specimens only), is one component of a comprehensive MRSA colonization surveillance program. It is  not intended to diagnose MRSA infection nor to guide or monitor treatment for MRSA infections. CRITICAL RESULT CALLED TO, READ BACK BY AND VERIFIED WITH:  AMELIA BERRY AT 1644 03/25/16 SDR   Urine culture     Status: Abnormal   Collection Time: 03/26/16  1:02 AM  Result Value Ref Range Status   Specimen Description URINE, RANDOM  Final   Special Requests NONE  Final   Culture (A)  Final    20,000 COLONIES/mL ESCHERICHIA COLI Results Called to: AMELIA BERRY, RN  AT 1055 ON 03/28/16 BY CTJ. ESBL-EXTENDED SPECTRUM BETA LACTAMASE-THE ORGANISM IS RESISTANT TO PENICILLINS, CEPHALOSPORINS AND AZTREONAM ACCORDING TO CLSI M100-S15 VOL.[place].    Report Status 03/28/2016 FINAL  Final   Organism ID, Bacteria ESCHERICHIA COLI (A)  Final      Susceptibility   Escherichia coli - MIC*    AMPICILLIN >=32 RESISTANT Resistant     CEFAZOLIN >=64 RESISTANT Resistant     CEFTRIAXONE >=64 RESISTANT Resistant     CIPROFLOXACIN >=4 RESISTANT Resistant     GENTAMICIN <=1 SENSITIVE Sensitive     IMIPENEM <=0.25 SENSITIVE Sensitive     NITROFURANTOIN <=16 SENSITIVE Sensitive     TRIMETH/SULFA >=320 RESISTANT Resistant     AMPICILLIN/SULBACTAM 16 INTERMEDIATE Intermediate     PIP/TAZO <=4 SENSITIVE Sensitive     Extended ESBL POSITIVE Resistant     * 20,000 COLONIES/mL ESCHERICHIA COLI    Coagulation Studies: No results for input(s): LABPROT, INR in the last 72 hours.  Urinalysis: No results for input(s): COLORURINE, LABSPEC, PHURINE, GLUCOSEU, HGBUR, BILIRUBINUR, KETONESUR, PROTEINUR, UROBILINOGEN, NITRITE, LEUKOCYTESUR in the last 72 hours.  Invalid input(s): APPERANCEUR    Imaging: Dg Chest 1 View  03/28/2016  CLINICAL DATA:  OG tube placement EXAM: CHEST 1 VIEW COMPARISON:  Chest radiograph dated 03/28/2016 at 1626 hours FINDINGS: Endotracheal tube terminates 3 cm above the carina. Increased interstitial markings, corresponding to known tree-in-bud nodularity/multifocal pneumonia,  better evaluated on CT. No definite pleural effusions or pneumothorax. Right subclavian venous catheter terminates in the upper SVC. Left IJ venous catheter terminates in the low right atrium. Enteric tube courses below the diaphragm. IMPRESSION: Endotracheal tube terminates 3 cm above the carina. Enteric tube courses below the diaphragm. Additional stable support apparatus as above. Electronically Signed   By: Julian Hy M.D.   On: 03/28/2016 19:07  Dg Chest 1 View  03/28/2016  CLINICAL DATA:  Status post central line placement EXAM: CHEST 1 VIEW COMPARISON:  03/28/2016 FINDINGS: Endotracheal tube and nasogastric catheter are again noted and stable. Stable right subclavian central line is noted in the proximal superior vena cava. A new left jugular catheter is noted extending deep into the right atrium. This could be withdrawn 2-3 cm. No pneumothorax is noted. Persistent infiltrative changes are noted in both lungs. Cardiac shadow is stable. IMPRESSION: New left jugular catheter extending deep into the right atrium. This could be withdrawn 2-3 cm. No pneumothorax is noted. The remainder of the exam is stable. Electronically Signed   By: Inez Catalina M.D.   On: 03/28/2016 16:44   Dg Abd 1 View  03/28/2016  CLINICAL DATA:  OG tube placement EXAM: ABDOMEN - 1 VIEW COMPARISON:  CT abdomen pelvis dated 03/26/2016 FINDINGS: Enteric tube terminates in the distal gastric body. IMPRESSION: Enteric tube terminates in the distal gastric body. Electronically Signed   By: Julian Hy M.D.   On: 03/28/2016 19:09   Ct Head Wo Contrast  03/28/2016  CLINICAL DATA:  Unresponsive, acute respiratory failure. EXAM: CT HEAD WITHOUT CONTRAST TECHNIQUE: Contiguous axial images were obtained from the base of the skull through the vertex without intravenous contrast. COMPARISON:  None. FINDINGS: Bony calvarium appears intact. Mild bilateral maxillary and ethmoid sinusitis is noted. Minimal diffuse cortical atrophy is  noted. Mild chronic ischemic white matter disease is noted. No mass effect or midline shift is noted. Ventricular size is within normal limits. There is no evidence of mass lesion, hemorrhage or acute infarction. IMPRESSION: Minimal diffuse cortical atrophy. Mild chronic ischemic white matter disease. Mild bilateral maxillary and ethmoid sinusitis. No acute intracranial abnormality seen. Electronically Signed   By: Marijo Conception, M.D.   On: 03/28/2016 12:52     Medications:   . fentaNYL infusion INTRAVENOUS Stopped (03/29/16 0837)  . heparin 2,000 Units/hr (03/30/16 0700)  . midazolam (VERSED) infusion Stopped (03/29/16 0837)  . norepinephrine Stopped (03/28/16 2331)   . antiseptic oral rinse  7 mL Mouth Rinse 10 times per day  . budesonide (PULMICORT) nebulizer solution  0.25 mg Nebulization 4 times per day  . chlorhexidine gluconate (SAGE KIT)  15 mL Mouth Rinse BID  . Chlorhexidine Gluconate Cloth  6 each Topical Q0600  . fentaNYL (SUBLIMAZE) injection  50 mcg Intravenous Once  . insulin aspart  0-15 Units Subcutaneous 6 times per day  . ipratropium-albuterol  3 mL Nebulization Q6H  . lactulose  20 g Oral BID  . levothyroxine  37.5 mcg Intravenous Daily  . meropenem (MERREM) IV  500 mg Intravenous Q24H  . metoCLOPramide (REGLAN) injection  5 mg Intravenous Q6H  . mupirocin ointment  1 application Nasal BID  . pantoprazole (PROTONIX) IV  40 mg Intravenous QHS  . polyethylene glycol  17 g Oral BID  . senna-docusate  1 tablet Oral BID   sodium chloride, acetaminophen, albuterol, fentaNYL, midazolam, ondansetron (ZOFRAN) IV, sodium chloride flush  Assessment/ Plan:  Ms. Tasha Long is a 60 y.o. white female with chronic respiratory failure due to COPD, morbid obesity, obstructive sleep apnea requiring CPAP, chronic severe bilateral lymphedema in the legs, bipolar disorder, anxiety, IBS, GERD, hypertension, hyperlipidemia, hypothyroidism, chronic stasis dermatiti , was  admitted on 03/25/2016 with Acute respiratory distress.   1. Acute renal failure on chronic kidney disease stage III with baseline creatinine of 1.2, eGFR of 49: nonoliguric. Secondary to ATN. With elevated vancomycin levels.  On vasopressors. Not responsive to IV diuretics.  Overall prognosis poor. Discussed dialysis with patient's family, at this time do not think patient is a good candidate - Discussed case with Dr. Stevenson Clinch and Dr. Megan Salon. Palliative care consult for later today.   3. Acute respiratory failure: intubated on mechanical ventilation. with multilobar pneumonia. Afebrile.  - meropenem. Continue supportive care  3. Sepsis/Hypotension secondary to ESBL E. Coli Urinary tract infection: no longer on vasopressors.   4. Anemia: hemoglobin 7.6 - low threshold for epo and/or PRBC transfusion.    LOS: [place], [place] 4/19/20178:56 AM

## 2016-03-30 NOTE — Progress Notes (Addendum)
Nutrition Follow-up  DOCUMENTATION CODES:   Morbid obesity  INTERVENTION:  -Received verbal order from  MD Mungal to start trophic feedings today; recommend starting Vital High Protein at rate of 20 ml/hr with addition of Prostat 60 mL (2 packets) TID. If tolerating, Goal rate of TF is 40 ml/hr with Prostat QID (meets 100% estimated calorie and protein needs as per ASPEN guidelines. Recommend maintenance flushes (30 mL q 4 hours per Adult Tube Feeding Protocol) at present. Continue to assess   NUTRITION DIAGNOSIS:   Inadequate oral intake related to acute illness as evidenced by NPO status. Continues but being addressed as TF starting today  GOAL:   Provide needs based on ASPEN/SCCM guidelines  MONITOR:   Vent status, Labs, Weight trends, I & O's  REASON FOR ASSESSMENT:   Ventilator    ASSESSMENT:    CT head negative, EEG performed 4/18; pt off sedation for >24 hours, off vasopresors, palliative care consulted. UOP 800 mL (improved), no plan for dialysis at this time  Patient is currently intubated on ventilator support  Diet Order:   NPO day 5  Digestive System: abdomen soft, obese/distended; constipated, OG with bilious output, reglan started yesterday  Skin:  Reviewed, no issues  Last BM:  no BM documented since admission   Labs: Creatinine 4.12, BUN 34  Meds: ss novolog, lactulose, reglan  Height:   Ht Readings from Last 1 Encounters:  03/28/16 5\' 5"  (1.651 m)    Weight:   Wt Readings from Last 1 Encounters:  03/30/16 390 lb 14 oz (177.3 kg)    Ideal Body Weight:  56.8 kg  BMI:  Body mass index is 65.05 kg/(m^2).  Estimated Nutritional Needs:   Kcal:  1254-1425 kcals (22-25 kcals/kg IBW per ASPEN guidelines for BMI >50)  Protein:  143 g (2.5 g/kg)   Fluid:  >1.7 L  EDUCATION NEEDS:   Education needs no appropriate at this time  [place], [place], [place] [phone] Pager  [phone] Weekend/On-Call Pager

## 2016-03-30 NOTE — Progress Notes (Signed)
[facility] Progess Note    ASSESSMENT/PLAN    MAJOR EVENTS/TEST RESULTS: 04/14 TTE: pending.  04/14 LE venous US: negative.  03/25/16; abdomen US: negative, but incomplete exam.    INDWELLING DEVICES:: ETT 04/14 >>  R [place] CVL 04/14 >>   MICRO DATA: Urine 04/14 >>  Positive for E- Coli Resp 04/14 >> pending.  Blood 04/14 >> pending.  MRSA PCR positive.   PCT algorithm 04/14: 1.48  ANTIMICROBIALS:  Vanc 04/14 >>  Metronidazole 04/14 >>  Cefepime 04/14 >>  Meropenem 4/19>>  .  ---------------------------------------   ----------------------------------------   Name: Letti Ballog MRN: [id number] DOB: August 28, 1956    ADMISSION DATE:  03/25/2016  VITAL SIGNS: Temp:  [97.3 F (36.3 C)-98.6 F (37 C)] 97.3 F (36.3 C) (04/19 0700) Pulse Rate:  [80-123] 82 (04/19 0700) Resp:  [18-26] 18 (04/19 0700) BP: (117-141)/(44-73) 129/49 mmHg (04/19 0700) SpO2:  [79 %-99 %] 96 % (04/19 0700) FiO2 (%):  [35 %-40 %] 35 % (04/19 0440) Weight:  [390 lb 14 oz (177.3 kg)] 390 lb 14 oz (177.3 kg) (04/19 0438) HEMODYNAMICS: CVP:  [11 mmHg-13 mmHg] 12 mmHg VENTILATOR SETTINGS: Vent Mode:  [-] PRVC FiO2 (%):  [35 %-40 %] 35 % Set Rate:  [26 bmp] 26 bmp Vt Set:  [500 mL] 500 mL PEEP:  [5 cmH20] 5 cmH20 Plateau Pressure:  [14 cmH20] 14 cmH20 INTAKE / OUTPUT:  Intake/Output Summary (Last 24 hours) at 03/30/16 [id number] Last data filed at 03/30/16 0600  Gross per 24 hour  Intake    540 ml  Output   1700 ml  Net  -1160 ml   SUBJECTIVE:  Patient remains obtunded and on ventilator, sedation is off   Patient does not follow any commands no purposeful movements at all. Patient did not have any significant events at night.    PHYSICAL EXAMINATION: Physical Examination:   VS: BP 129/49 mmHg  Pulse 82  Temp(Src) 97.3 F (36.3 C) (Core (Comment))  Resp 18  Ht 5\' 5"  (1.651 m)  Wt 390 lb 14 oz (177.3 kg)  BMI 65.05 kg/m2  SpO2 96%    General Appearance: Very sickly appearing white female Neuro:Atraumatic, normocephalic,Grimaces to pain HEENT: Pupils sluggishly reacting to light,no JVD appreciated, no discharge noted. Pulmonary: Clear bilaterally, no weezes, crackles, rhonchi noted   Cardiovascular Normal S1,S2.  NoMRG, RRR  Abdomen Obese, round, hypoactive bowel sounds Renal:  No costovertebral tenderness  GU: not performed Endocrine: No evident thyromegaly. Skin:   warm,, no ecchymosis ,cellulitis present , red Extremities: +3 pitting edema, warm to touch, red   LABS:   LABORATORY PANEL:   CBC  Recent Labs Lab 03/30/16 0401  WBC 6.5  HGB 7.6*  HCT 24.4*  PLT 190    Chemistries   Recent Labs Lab 03/27/16 0352 03/28/16 0349  03/30/16 0401  NA 134* 133*  < > 136  K 4.1 4.1  < > 4.4  CL 101 102  < > 105  CO2 27 23  < > 22  GLUCOSE 90 79  < > 77  BUN 24* 29*  < > 34*  CREATININE 2.23* 2.92*  < > 4.12*  CALCIUM 7.9* 8.0*  < > 8.1*  MG 2.1  --   --   --   PHOS 5.5*  --   < > 5.6*  AST  --  29  --   --   ALT  --  11*  --   --  ALKPHOS  --  30*  --   --   BILITOT  --  1.0  --   --   < > = values in this interval not displayed.   Recent Labs Lab 03/29/16 1612 03/29/16 2004 03/29/16 2043 03/30/16 0004 03/30/16 0410 03/30/16 0719  GLUCAP 78 65 90 69 78 83    Recent Labs Lab 03/27/16 0500 03/27/16 1200 03/28/16 0500  PHART 7.24* 7.25* 7.40  PCO2ART 66* 59* 40  PO2ART 77* 69* 93    Recent Labs Lab 03/25/16 0719 03/25/16 2228 03/28/16 0349 03/29/16 0552 03/30/16 0401  AST 30 30 29   --   --   ALT 10* 12* 11*  --   --   ALKPHOS 45 31* 30*  --   --   BILITOT 0.4 0.6 1.0  --   --   ALBUMIN 3.5 2.6* 2.4* 2.2* 2.1*    Cardiac Enzymes  Recent Labs Lab 03/25/16 2228  TROPONINI 0.42*    RADIOLOGY:  Dg Chest 1 View  03/28/2016  CLINICAL DATA:  OG tube placement EXAM: CHEST 1 VIEW COMPARISON:  Chest radiograph dated 03/28/2016 at 1626 hours FINDINGS: Endotracheal tube  terminates 3 cm above the carina. Increased interstitial markings, corresponding to known tree-in-bud nodularity/multifocal pneumonia, better evaluated on CT. No definite pleural effusions or pneumothorax. Right subclavian venous catheter terminates in the upper SVC. Left IJ venous catheter terminates in the low right atrium. Enteric tube courses below the diaphragm. IMPRESSION: Endotracheal tube terminates 3 cm above the carina. Enteric tube courses below the diaphragm. Additional stable support apparatus as above. Electronically Signed   By: Julian Hy M.D.   On: 03/28/2016 19:07   Dg Chest 1 View  03/28/2016  CLINICAL DATA:  Status post central line placement EXAM: CHEST 1 VIEW COMPARISON:  03/28/2016 FINDINGS: Endotracheal tube and nasogastric catheter are again noted and stable. Stable right subclavian central line is noted in the proximal superior vena cava. A new left jugular catheter is noted extending deep into the right atrium. This could be withdrawn 2-3 cm. No pneumothorax is noted. Persistent infiltrative changes are noted in both lungs. Cardiac shadow is stable. IMPRESSION: New left jugular catheter extending deep into the right atrium. This could be withdrawn 2-3 cm. No pneumothorax is noted. The remainder of the exam is stable. Electronically Signed   By: Inez Catalina M.D.   On: 03/28/2016 16:44   Dg Abd 1 View  03/28/2016  CLINICAL DATA:  OG tube placement EXAM: ABDOMEN - 1 VIEW COMPARISON:  CT abdomen pelvis dated 03/26/2016 FINDINGS: Enteric tube terminates in the distal gastric body. IMPRESSION: Enteric tube terminates in the distal gastric body. Electronically Signed   By: Julian Hy M.D.   On: 03/28/2016 19:09   Ct Head Wo Contrast  03/28/2016  CLINICAL DATA:  Unresponsive, acute respiratory failure. EXAM: CT HEAD WITHOUT CONTRAST TECHNIQUE: Contiguous axial images were obtained from the base of the skull through the vertex without intravenous contrast. COMPARISON:  None.  FINDINGS: Bony calvarium appears intact. Mild bilateral maxillary and ethmoid sinusitis is noted. Minimal diffuse cortical atrophy is noted. Mild chronic ischemic white matter disease is noted. No mass effect or midline shift is noted. Ventricular size is within normal limits. There is no evidence of mass lesion, hemorrhage or acute infarction. IMPRESSION: Minimal diffuse cortical atrophy. Mild chronic ischemic white matter disease. Mild bilateral maxillary and ethmoid sinusitis. No acute intracranial abnormality seen. Electronically Signed   By: Marijo Conception, M.D.   On:  03/28/2016 12:52    PT PROFILE:  12 F in chronically very poor health developed abdominal pain and EMS was dispatched. Suffered respiratory arrest and brief cardiac arrest in the transfer from her bed to wheelchair and into ambulance. Intubated and transported to ED. Suffered brief PEA arrest again in ICU shortly after arrival   PULMONARY A: Respiratory arrest COPD OSA Smoker Concern for PE given numerous RFs - no CTA given AKI.  P:  Continue anticoagulation empirically.  Continue vent support, not weaning candidate at this time.   Continue Albuterol, pulmicort  Nebs.   CARDIOVASCULAR A:  Cardiac arrest due to respiratory arrest Hypertension Hyperlipidemia  P:   Echo indicative of Four-chamber dilated patient, with moderate left ventricle  systolic dysfunction and mild diastolic dysfunction with diffuse  hypokinesis   MAP goal > 65 mmHg CVL placed  RENAL A:  AKI, increased creatinine today.  Hyperkalemia Very poor candidate for HD P:  Monitor I/O, monitor kidney function.  Continue IVF. Nephrology following the patient  CRRT  Per nephrology  GASTROINTESTINAL A:  Morbid obesity Abdominal pain P:  SUP: IV PPI TF protocol   . HEMATOLOGIC A:  Anemia with microcytosis - no overt bleeding P:  DVT px: full dose heparin heparin Monitor CBC intermittently Transfuse per usual  guidelines  INFECTIOUS A:  Possible abdominal sepsis Possible PNA Possible LE cellulitis with prior hx of MRSA cellulitis ESBL in the urine P:  Monitor temp, WBC count Micro and abx as above Continue vanc and meropenem for now  ENDOCRINE A:  Suspect insulin resistance though no history of DM Glucose controlled.  Hypothyroidism P:  IV L-thyroxine. Transition to enteral when able SSI - mod scale  NEUROLOGIC A:  Post arrest anoxic encephalopathy ICU associated discomfort P:  RASS goal: -2, -3 PAD protocol Daily WUA CT head was negative for any acute anomaly  EEG results pending     Poet Hineman,AG-ACNP Pulmonary & Critical Care   Note:

## 2016-03-31 DIAGNOSIS — N390 Urinary tract infection, site not specified: Secondary | ICD-10-CM

## 2016-03-31 LAB — BASIC METABOLIC PANEL
ANION GAP: 9 (ref 5–15)
BUN: 44 mg/dL — ABNORMAL HIGH (ref 6–20)
CHLORIDE: 106 mmol/L (ref 101–111)
CO2: 23 mmol/L (ref 22–32)
Calcium: 9 mg/dL (ref 8.9–10.3)
Creatinine, Ser: 4.57 mg/dL — ABNORMAL HIGH (ref 0.44–1.00)
GFR calc Af Amer: 11 mL/min — ABNORMAL LOW (ref >60)
GFR, EST NON AFRICAN AMERICAN: 10 mL/min — AB (ref >60)
GLUCOSE: 104 mg/dL — AB (ref 65–99)
POTASSIUM: 4.6 mmol/L (ref 3.5–5.1)
Sodium: 138 mmol/L (ref 135–145)

## 2016-03-31 LAB — GLUCOSE, CAPILLARY
GLUCOSE-CAPILLARY: 110 mg/dL — AB (ref 65–99)
GLUCOSE-CAPILLARY: 112 mg/dL — AB (ref 65–99)
GLUCOSE-CAPILLARY: 97 mg/dL (ref 65–99)
GLUCOSE-CAPILLARY: 97 mg/dL (ref 65–99)
Glucose-Capillary: 95 mg/dL (ref 65–99)
Glucose-Capillary: 102 mg/dL — ABNORMAL HIGH (ref 65–99)
Glucose-Capillary: 101 mg/dL — ABNORMAL HIGH (ref 65–99)
Glucose-Capillary: 87 mg/dL (ref 65–99)

## 2016-03-31 LAB — CBC
HCT: 27.4 % — ABNORMAL LOW (ref 35.0–47.0)
HEMOGLOBIN: 8.5 g/dL — AB (ref 12.0–16.0)
MCH: 23.1 pg — ABNORMAL LOW (ref 26.0–34.0)
MCHC: 31.1 g/dL — ABNORMAL LOW (ref 32.0–36.0)
MCV: 74.2 fL — ABNORMAL LOW (ref 80.0–100.0)
Platelets: 191 10*3/uL (ref 150–440)
RBC: 3.7 MIL/uL — AB (ref 3.80–5.20)
RDW: 19.8 % — ABNORMAL HIGH (ref 11.5–14.5)
WBC: 9.1 10*3/uL (ref 3.6–11.0)

## 2016-03-31 LAB — HEPARIN LEVEL (UNFRACTIONATED): HEPARIN UNFRACTIONATED: 0.49 [IU]/mL (ref 0.30–0.70)

## 2016-03-31 MED ORDER — VITAL HIGH PROTEIN PO LIQD
1000.0000 mL | ORAL | Status: DC
  Administered 2016-04-01 – 2016-04-10 (×8): 1000 mL

## 2016-03-31 MED ORDER — PRO-STAT SUGAR FREE PO LIQD
30.0000 mL | Freq: Four times a day (QID) | ORAL | Status: DC
  Administered 2016-03-31 – 2016-04-10 (×40): 30 mL

## 2016-03-31 NOTE — Progress Notes (Signed)
Nutrition Follow-up  DOCUMENTATION CODES:   Morbid obesity  INTERVENTION:  -Recommend increasing Vital High Protein to rate of 40 ml/hr with Prostat QID as per recommendations yesterday, continue maintenance flushes only at present due to volume status -Pt continues without BM; discussed with MD and Pharmacist during ICU rounds   NUTRITION DIAGNOSIS:   Inadequate oral intake related to acute illness as evidenced by NPO status.  GOAL:   Provide needs based on ASPEN/SCCM guidelines  MONITOR:   Vent status, Labs, Weight trends, I & O's  REASON FOR ASSESSMENT:   Ventilator    ASSESSMENT:    Pt remains on vent, off sedation for at least 48 hours with poor neurologic response, noted neurology consulted, renal function remains poor, no plan to start dialysis, UOP 600 mL  Pt tolerating Vital High Protein at rate of 20 ml/hr, Prostat 60 mL TID  Diet Order:   NPO  Digestive System: abdomen soft, obese, BS active, no signs of TF intolerance  Skin:  Reviewed, no issues  Last BM:  no BM documented since admission   Labs: phosphorus 5.6 yesterday, [place] 4.57, BUN 44  Meds: ss novolog, lactulose, reglan, miralax, senokot  Height:   Ht Readings from Last 1 Encounters:  03/28/16 5\' 5"  (1.651 m)    Weight:   Wt Readings from Last 1 Encounters:  03/31/16 395 lb 11.6 oz (179.5 kg)    Ideal Body Weight:  56.8 kg  BMI:  Body mass index is 65.85 kg/(m^2).  Estimated Nutritional Needs:   Kcal:  1254-1425 kcals (22-25 kcals/kg IBW per ASPEN guidelines for BMI >50)  Protein:  143 g (2.5 g/kg)   Fluid:  >1.7 L  EDUCATION NEEDS:   Education needs no appropriate at this time  [place], [place], [place] [phone] Pager  [phone] Weekend/On-Call Pager

## 2016-03-31 NOTE — Progress Notes (Signed)
Patient remains intubated on vent, no medication given for sedation overnight. VSS family updated at bedside see CH[facility] for further update.

## 2016-03-31 NOTE — Progress Notes (Signed)
Paged and spoke with Dr. Juleen China in regards to pt's output only 75 cc this shift. He stated he would reevaluated need to dialysis in am.

## 2016-03-31 NOTE — Consult Note (Signed)
WOC wound consult note Reason for Consult: Bilateral lymphedema and elephantiasis.  Seen at [facility] for manual lymph drainage and was fitted with pneumatic compression pumps. Unclear if patient ever obtained compression garments needed.  Wound type:Lymphedema Pressure Ulcer POA: N/A Measurement:Thickening in bilateral legs, erythema and scabbed lesions.   Wound [id number] lesions present to bilateral legs.  Drainage (amount, consistency, odor) Scant weeping at times, otherwise intact.  Periwound:Erythema, chronic skin changes Dressing procedure/placement/frequency:Keep legs clean and dry.  Moisturize daily.  Will not implement compression at this time due to fragile respiratory state.  Do not want to mobilize excess fluid and exacerbate these symptoms.  Family planning is underway regarding goals of long term care, including dialysis and ventilation.  Will re-assess once decisions are made.  Will not follow at this time.  Please re-consult if needed.  Domenic Moras RN BSN [place] Pager [phone]

## 2016-03-31 NOTE — Progress Notes (Signed)
Update: Family stated that they wanted a 2nd opinion on care and treatment plans.  They stated that she was previously at [facility] for 3 weeks in Jan 2017, and they would like her to be transferred back there.  I spoke to all family members about treatment options (trach, peg, forms of dialysis) we have at [facility] and can perform, however, they still requested transfer.  I have spoke with [facility] , Dr. Deeann Dowse at the MICU, who has accepted the patient in transfer pending bed availability.  Critical care time - 35 mins  Vilinda Boehringer, MD [place] [facility] Pager [phone] (please enter 7-digits) On Call Pager - [phone] (please enter 7-digits)

## 2016-03-31 NOTE — Progress Notes (Signed)
[place] [facility] Progess Note    ASSESSMENT/PLAN    MAJOR EVENTS/TEST RESULTS: 04/14 TTE: pending.  04/14 LE venous US: negative.  03/25/16; abdomen US: negative, but incomplete exam.    INDWELLING DEVICES:: ETT 04/14 >>  R [place] CVL 04/14 >>   MICRO DATA: Urine 04/14 >>  Positive for E- Coli Resp 04/14 >> pending.  Blood 04/14 >> negative.  MRSA PCR positive.   PCT algorithm 04/14: 1.48  ANTIMICROBIALS:  Vanc 04/14 >>  Metronidazole 04/14 >>  Cefepime 04/14 >>  Meropenem 4/19>>  .  ---------------------------------------   ----------------------------------------   Name: Tasha Long MRN: [id number] DOB: 25-Sep-1956    ADMISSION DATE:  03/25/2016  VITAL SIGNS: Temp:  [94.6 F (34.8 C)-97.9 F (36.6 C)] 97.2 F (36.2 C) (04/20 0900) Pulse Rate:  [82-100] 92 (04/20 0900) Resp:  [11-26] 16 (04/20 0900) BP: (104-151)/(48-125) 130/76 mmHg (04/20 0900) SpO2:  [91 %-99 %] 98 % (04/20 0915) FiO2 (%):  [35 %] 35 % (04/20 0915) Weight:  [395 lb 11.6 oz (179.5 kg)] 395 lb 11.6 oz (179.5 kg) (04/20 0500) HEMODYNAMICS: CVP:  [16 mmHg-61 mmHg] 25 mmHg VENTILATOR SETTINGS: Vent Mode:  [-] PRVC FiO2 (%):  [35 %] 35 % Set Rate:  [26 bmp-30 bmp] 30 bmp Vt Set:  [500 mL] 500 mL PEEP:  [5 cmH20] 5 cmH20 Plateau Pressure:  [31 cmH20] 31 cmH20 INTAKE / OUTPUT:  Intake/Output Summary (Last 24 hours) at 03/31/16 1000 Last data filed at 03/31/16 0500  Gross per 24 hour  Intake    944 ml  Output    690 ml  Net    254 ml   SUBJECTIVE:  Patient remains obtunded and on ventilator, sedation is off   Patient does not follow any commands no purposeful movements at all. Patient did not have any significant events at night.  Family members at the bedside and want to get her go to [facility] for a second opinion.    PHYSICAL EXAMINATION: Physical Examination:   VS: BP 130/76 mmHg  Pulse 92  Temp(Src) 97.2 F (36.2 C) (Core (Comment))  Resp  16  Ht 5\' 5"  (1.651 m)  Wt 395 lb 11.6 oz (179.5 kg)  BMI 65.85 kg/m2  SpO2 98%  General Appearance: Very sickly appearing white female Neuro:Atraumatic, normocephalic,Grimaces to pain HEENT: Pupils sluggishly reacting to light,no JVD appreciated, no discharge noted. Pulmonary: Clear bilaterally, no weezes, crackles, rhonchi noted   Cardiovascular Normal S1,S2.  NoMRG, RRR  Abdomen Obese, round, hypoactive bowel sounds Renal:  No costovertebral tenderness  GU: not performed Endocrine: No evident thyromegaly. Skin:   warm,, no ecchymosis ,cellulitis present , red Extremities: +3 pitting edema, warm to touch, red   LABS:   LABORATORY PANEL:   CBC  Recent Labs Lab 03/31/16 0526  WBC 9.1  HGB 8.5*  HCT 27.4*  PLT 191    Chemistries   Recent Labs Lab 03/27/16 0352 03/28/16 0349  03/30/16 0401 03/31/16 0520  NA 134* 133*  < > 136 138  K 4.1 4.1  < > 4.4 4.6  CL 101 102  < > 105 106  CO2 27 23  < > 22 23  GLUCOSE 90 79  < > 77 104*  BUN 24* 29*  < > 34* 44*  CREATININE 2.23* 2.92*  < > 4.12* 4.57*  CALCIUM 7.9* 8.0*  < > 8.1* 9.0  MG 2.1  --   --   --   --   PHOS 5.5*  --   < >  5.6*  --   AST  --  29  --   --   --   ALT  --  11*  --   --   --   ALKPHOS  --  30*  --   --   --   BILITOT  --  1.0  --   --   --   < > = values in this interval not displayed.   Recent Labs Lab 03/30/16 1141 03/30/16 1608 03/30/16 2011 03/30/16 2307 03/31/16 0414 03/31/16 0732  GLUCAP 84 89 97 95 102* 101*    Recent Labs Lab 03/27/16 0500 03/27/16 1200 03/28/16 0500  PHART 7.24* 7.25* 7.40  PCO2ART 66* 59* 40  PO2ART 77* 69* 93    Recent Labs Lab 03/25/16 0719 03/25/16 2228 03/28/16 0349 03/29/16 0552 03/30/16 0401  AST 30 30 29   --   --   ALT 10* 12* 11*  --   --   ALKPHOS 45 31* 30*  --   --   BILITOT 0.4 0.6 1.0  --   --   ALBUMIN 3.5 2.6* 2.4* 2.2* 2.1*    Cardiac Enzymes  Recent Labs Lab 03/25/16 2228  TROPONINI 0.42*    RADIOLOGY:  No  results found.  PT PROFILE:  73 F in chronically very poor health developed abdominal pain and EMS was dispatched. Suffered respiratory arrest and brief cardiac arrest in the transfer from her bed to wheelchair and into ambulance. Intubated and transported to ED. Suffered brief PEA arrest again in ICU shortly after arrival   PULMONARY A: Respiratory arrest COPD OSA Smoker Concern for PE given numerous RFs - no CTA given AKI.  P:  Continue anticoagulation empirically.  Continue vent support, not weaning candidate at this time.   Continue Albuterol, pulmicort  Nebs.   CARDIOVASCULAR A:  Cardiac arrest due to respiratory arrest Hypertension Hyperlipidemia  P:   Echo indicative of Four-chamber dilated patient, with moderate left ventricle  systolic dysfunction and mild diastolic dysfunction with diffuse  hypokinesis   MAP goal > 65 mmHg CVL placed  RENAL A:  AKI, increased creatinine today.  Hyperkalemia Very poor candidate for HD P:  Monitor I/O, monitor kidney function.  Continue IVF. Nephrology following the patient  CRRT  Per nephrology  GASTROINTESTINAL A:  Morbid obesity Abdominal pain P:  SUP: IV PPI TF protocol   . HEMATOLOGIC A:  Anemia with microcytosis - no overt bleeding P:  DVT px: full dose heparin heparin Monitor CBC intermittently Transfuse per usual guidelines  INFECTIOUS A:  Possible abdominal sepsis Possible PNA Possible LE cellulitis with prior hx of MRSA cellulitis ESBL in the urine P:  Monitor temp, WBC count Micro and abx as above Continue vanc and meropenem for now  ENDOCRINE A:  Suspect insulin resistance though no history of DM Glucose controlled.  Hypothyroidism P:  IV L-thyroxine. Transition to enteral when able SSI - mod scale  NEUROLOGIC A:  Post arrest anoxic encephalopathy ICU associated discomfort P:  RASS goal: -2, -3 PAD protocol Daily WUA CT head was negative for any  acute anomaly     Dr. Tyron Russell and Dr. Stevenson Clinch had extensive family discussion on 4/19.   [place] [facility]

## 2016-03-31 NOTE — Progress Notes (Signed)
[facility]  ROUNDING NOTE   Subjective:   Son at bedside. Family meeting yesterday.   UOP 690  Creatinine 4.57 (4.12)   Objective:  Vital signs in last 24 hours:  Temp:  [94.6 F (34.8 C)-97.9 F (36.6 C)] 97.2 F (36.2 C) (04/20 0900) Pulse Rate:  [82-100] 92 (04/20 0900) Resp:  [11-26] 16 (04/20 0900) BP: (104-151)/(48-125) 130/76 mmHg (04/20 0900) SpO2:  [91 %-99 %] 98 % (04/20 0915) FiO2 (%):  [35 %] 35 % (04/20 0915) Weight:  [179.5 kg (395 lb 11.6 oz)] 179.5 kg (395 lb 11.6 oz) (04/20 0500)  Weight change: 2.2 kg (4 lb 13.6 oz) Filed Weights   03/29/16 0600 03/30/16 0438 03/31/16 0500  Weight: 174.9 kg (385 lb 9.4 oz) 177.3 kg (390 lb 14 oz) 179.5 kg (395 lb 11.6 oz)    Intake/Output: I/O last 3 completed shifts: In: 1204 [I.V.:680; Other:90; NG/GT:384; IV Piggyback:50] Out: 2140 [Urine:1240; Emesis/NG output:900]   Intake/Output this shift:     Physical Exam: General: Critically ill  Head: ETT OGT  Eyes: Eyes closed  Neck: trachea midline  Lungs:  Coarse breath sounds, PRVC FiO2 35%  Heart: Regular rate and rhythm  Abdomen:  Soft, nontender, obese, no bowel soundss  Extremities: nonpitting peripheral edema.  Neurologic: Sedated, intubated  Skin: Erythema bilateral lower extremities  Access: vascath 4/17 Dr. Mortimer Fries    Basic Metabolic Panel:  Recent Labs Lab 03/26/16 0535 03/27/16 0352 03/28/16 0349 03/29/16 0552 03/30/16 0401 03/31/16 0520  NA 133* 134* 133* 135 136 138  K 4.0 4.1 4.1 4.2 4.4 4.6  CL 99* 101 102 105 105 106  CO2 30 27 23 24 22 23   GLUCOSE 86 90 79 83 77 104*  BUN 22* 24* 29* 32* 34* 44*  CREATININE 1.74* 2.23* 2.92* 3.46* 4.12* 4.57*  CALCIUM 8.3* 7.9* 8.0* 7.9* 8.1* 9.0  MG  --  2.1  --   --   --   --   PHOS 4.6 5.5*  --  5.4* 5.6*  --     Liver Function Tests:  Recent Labs Lab 03/25/16 0719 03/25/16 2228 03/28/16 0349 03/29/16 0552 03/30/16 0401  AST 30 30 29   --   --   ALT 10* 12* 11*  --   --    ALKPHOS 45 31* 30*  --   --   BILITOT 0.4 0.6 1.0  --   --   PROT 8.2* 6.3* 6.3*  --   --   ALBUMIN 3.5 2.6* 2.4* 2.2* 2.1*    Recent Labs Lab 03/25/16 0719  LIPASE 18   No results for input(s): AMMONIA in the last 168 hours.  CBC:  Recent Labs Lab 03/25/16 0719  03/27/16 0352 03/28/16 0349 03/29/16 0552 03/30/16 0401 03/31/16 0526  WBC 17.9*  < > 7.0 5.4 5.4 6.5 9.1  NEUTROABS 8.2*  --   --   --   --   --   --   HGB 9.8*  < > 8.2* 8.1* 7.7* 7.6* 8.5*  HCT 32.8*  < > 26.7* 25.7* 24.8* 24.4* 27.4*  MCV 76.6*  < > 76.2* 75.0* 73.6* 75.2* 74.2*  PLT 320  < > 192 188 184 190 191  < > = values in this interval not displayed.  Cardiac Enzymes:  Recent Labs Lab 03/25/16 0719 03/25/16 1529 03/25/16 2228  TROPONINI 0.56* 0.81* 0.42*    BNP: Invalid input(s): POCBNP  CBG:  Recent Labs Lab 03/30/16 1608 03/30/16 2011 03/30/16 2307 03/31/16  0414 03/31/16 0732  GLUCAP 89 97 95 102* 101*    Microbiology: Results for orders placed or performed during the hospital encounter of 03/25/16  Culture, blood (routine x 2)     Status: None   Collection Time: 03/25/16  7:19 AM  Result Value Ref Range Status   Specimen Description BLOOD LEFT FOREARM  Final   Special Requests BOTTLES DRAWN AEROBIC AND ANAEROBIC  1CC  Final   Culture NO GROWTH 5 DAYS  Final   Report Status 03/30/2016 FINAL  Final  Culture, blood (routine x 2)     Status: None   Collection Time: 03/25/16  7:19 AM  Result Value Ref Range Status   Specimen Description BLOOD LEFT HAND  Final   Special Requests BOTTLES DRAWN AEROBIC AND ANAEROBIC  1CC  Final   Culture NO GROWTH 5 DAYS  Final   Report Status 03/30/2016 FINAL  Final  MRSA PCR Screening     Status: Abnormal   Collection Time: 03/25/16 12:42 PM  Result Value Ref Range Status   MRSA by PCR POSITIVE (A) NEGATIVE Final    Comment:        The GeneXpert MRSA Assay (FDA approved for NASAL specimens only), is one component of a comprehensive  MRSA colonization surveillance program. It is not intended to diagnose MRSA infection nor to guide or monitor treatment for MRSA infections. CRITICAL RESULT CALLED TO, READ BACK BY AND VERIFIED WITH:  AMELIA BERRY AT 1644 03/25/16 SDR   Urine culture     Status: Abnormal   Collection Time: 03/26/16  1:02 AM  Result Value Ref Range Status   Specimen Description URINE, RANDOM  Final   Special Requests NONE  Final   Culture (A)  Final    20,000 COLONIES/mL ESCHERICHIA COLI Results Called to: AMELIA BERRY, RN  AT 1055 ON 03/28/16 BY CTJ. ESBL-EXTENDED SPECTRUM BETA LACTAMASE-THE ORGANISM IS RESISTANT TO PENICILLINS, CEPHALOSPORINS AND AZTREONAM ACCORDING TO CLSI M100-S15 VOL.[place].    Report Status 03/28/2016 FINAL  Final   Organism ID, Bacteria ESCHERICHIA COLI (A)  Final      Susceptibility   Escherichia coli - MIC*    AMPICILLIN >=32 RESISTANT Resistant     CEFAZOLIN >=64 RESISTANT Resistant     CEFTRIAXONE >=64 RESISTANT Resistant     CIPROFLOXACIN >=4 RESISTANT Resistant     GENTAMICIN <=1 SENSITIVE Sensitive     IMIPENEM <=0.25 SENSITIVE Sensitive     NITROFURANTOIN <=16 SENSITIVE Sensitive     TRIMETH/SULFA >=320 RESISTANT Resistant     AMPICILLIN/SULBACTAM 16 INTERMEDIATE Intermediate     PIP/TAZO <=4 SENSITIVE Sensitive     Extended ESBL POSITIVE Resistant     * 20,000 COLONIES/mL ESCHERICHIA COLI  Culture, respiratory (tracheal aspirate)     Status: None (Preliminary result)   Collection Time: 03/30/16  8:50 AM  Result Value Ref Range Status   Specimen Description TRACHEAL ASPIRATE  Final   Special Requests NONE  Final   Gram Stain PENDING  Incomplete   Culture HOLDING FOR POSSIBLE PATHOGEN  Final   Report Status PENDING  Incomplete    Coagulation Studies: No results for input(s): LABPROT, INR in the last 72 hours.  Urinalysis: No results for input(s): COLORURINE, LABSPEC, PHURINE, GLUCOSEU, HGBUR, BILIRUBINUR, KETONESUR, PROTEINUR, UROBILINOGEN,  NITRITE, LEUKOCYTESUR in the last 72 hours.  Invalid input(s): APPERANCEUR    Imaging: No results found.   Medications:   . fentaNYL infusion INTRAVENOUS Stopped (03/29/16 0837)  . heparin 2,000 Units/hr (03/31/16 0301)  .  midazolam (VERSED) infusion Stopped (03/29/16 0837)  . norepinephrine Stopped (03/28/16 2331)   . antiseptic oral rinse  7 mL Mouth Rinse 10 times per day  . budesonide (PULMICORT) nebulizer solution  0.25 mg Nebulization 4 times per day  . chlorhexidine gluconate (SAGE KIT)  15 mL Mouth Rinse BID  . Chlorhexidine Gluconate Cloth  6 each Topical Q0600  . feeding supplement (PRO-STAT SUGAR FREE 64)  60 mL Per Tube TID  . feeding supplement (VITAL HIGH PROTEIN)  1,000 mL Per Tube Q24H  . fentaNYL (SUBLIMAZE) injection  50 mcg Intravenous Once  . insulin aspart  0-15 Units Subcutaneous 6 times per day  . ipratropium-albuterol  3 mL Nebulization Q6H  . lactulose  20 g Oral BID  . levothyroxine  37.5 mcg Intravenous Daily  . meropenem (MERREM) IV  500 mg Intravenous Q24H  . metoCLOPramide (REGLAN) injection  5 mg Intravenous Q6H  . mupirocin ointment  1 application Nasal BID  . pantoprazole (PROTONIX) IV  40 mg Intravenous QHS  . polyethylene glycol  17 g Oral BID  . senna-docusate  1 tablet Oral BID   sodium chloride, acetaminophen, albuterol, fentaNYL, midazolam, ondansetron (ZOFRAN) IV, sodium chloride flush  Assessment/ Plan:  Ms. Tasha Long is a 60 y.o. 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   1. Acute renal failure on chronic kidney disease stage III with baseline creatinine of 1.2, eGFR of 49: nonoliguric. Secondary to ATN. With elevated vancomycin  levels. On vasopressors. Not responsive to IV diuretics.  Overall prognosis poor. Discussed dialysis with patient's family, at this time do not think patient is a good candidate. Has not received dialysis.   3. Acute respiratory failure: intubated on mechanical ventilation. with multilobar pneumonia.  - meropenem. Continue supportive care  3. Sepsis/Hypotension secondary to ESBL E. Coli Urinary tract infection: no longer on vasopressors.   4. Anemia:  - low threshold for epo and/or PRBC transfusion.    LOS: [place], [place] 4/20/201710:20 AM

## 2016-03-31 NOTE — Progress Notes (Signed)
Pharmacy Antibiotic Note  Tasha Long is a 60 y.o. female admitted on 03/25/2016 with respiratory distress ordered empiric abx and ESBL Ecoli.  Pharmacy has been consulted for vancomycin and Merrem   Plan: Repeat vancomycin level 37 mcg/mL. Continue to hold vancomycin. Will recheck random level with am labs of Friday.    Will change to Merrem 500 mg IV q 24 hours since patient is in AKI and serum creatinine continues to increase. Will dose as if patient's CrCl <10 ml/min since Crcl can't accurately estimated renal function in AKI.   Height: 5\' 5"  (165.1 cm) Weight: (!) 395 lb 11.6 oz (179.5 kg) IBW/kg (Calculated) : 57  Temp (24hrs), Avg:97 F (36.1 C), Min:94.6 F (34.8 C), Max:97.9 F (36.6 C)   Recent Labs Lab 03/25/16 0719 03/25/16 1303  03/27/16 0352 03/27/16 1626 03/28/16 0349 03/28/16 0549 03/29/16 0552 03/29/16 1508 03/30/16 0401 03/31/16 0520 03/31/16 0526  WBC 17.9*  --   < > 7.0  --  5.4  --  5.4  --  6.5  --  9.1  CREATININE 1.20*  --   < > 2.23*  --  2.92*  --  3.46*  --  4.12* 4.57*  --   LATICACIDVEN 4.1* 1.4  --   --   --   --   --   --   --   --   --   --   VANCOTROUGH  --   --   --  55* 46*  --   --   --   --   --   --   --   VANCORANDOM  --   --   --   --   --   --  45  --  37  --   --   --   < > = values in this interval not displayed.  Estimated Creatinine Clearance: 22.5 mL/min (by C-G formula based on Cr of 4.57).    Allergies  Allergen Reactions  . Codeine Hives  . Sulfa Antibiotics Hives    Antimicrobials this admission: Zosyn 4/14 >> 4/14 vancomycin 4/14 >>  Cefepime 4/14 >> 4/17 Metronidazole 4/14 >> 4/17 Meropenem 4/17 >>  Dose adjustments this admission: Merrem 500 mg IV q24 hours 4/18   Microbiology results: 4/19 TA: pending 4/14 BCx: negative UCx: ESBL E coli MRSA PCR: positive  Thank you for allowing pharmacy to be a part of this patient's care.  Ulice Dash D, Pharm.D. Clinical Pharmacist 03/31/2016  3:27 PM

## 2016-03-31 NOTE — Discharge Summary (Deleted)
Physician Discharge Summary  Patient ID: Tasha Long MRN: [id number] DOB/AGE: [birth year] 60 y.o.  Admit date: 03/25/2016 Discharge date: 03/31/2016    Discharge Diagnoses: 36 F in chronically very poor health developed abdominal pain and EMS was dispatched. Suffered respiratory arrest and brief cardiac arrest in the transfer from her bed to wheelchair and into ambulance. Intubated and transported to ED. Suffered brief PEA arrest again in ICU shortly after arrival  General Appearance: Very sickly appearing white female Neuro:Atraumatic, normocephalic,Grimaces to pain HEENT: Pupils sluggishly reacting to light,no JVD appreciated, no discharge noted. Pulmonary: Clear bilaterally, no weezes, crackles, rhonchi noted  Cardiovascular Normal S1,S2. NoMRG, RRR  Abdomen Obese, round, hypoactive bowel sounds Renal: No costovertebral tenderness  GU: not performed Endocrine: No evident thyromegaly. Skin: warm,, no ecchymosis ,cellulitis present , red Extremities: +3 pitting edema, warm to touch, red                                                                       DISCHARGE PLAN BY DIAGNOSIS     PULMONARY A: Respiratory arrest COPD OSA Smoker Concern for PE given numerous RFs - no CTA given AKI.  P:  Continue anticoagulation empirically.  Continue vent support, not weaning candidate at this time.  Continue Albuterol, pulmicort Nebs.   CARDIOVASCULAR A:  Cardiac arrest due to respiratory arrest Hypertension Hyperlipidemia  P:   Echo indicative of Four-chamber dilated patient, with moderate left ventricle  systolic dysfunction and mild diastolic dysfunction with diffuse  hypokinesis  MAP goal > 65 mmHg CVL placed  RENAL A:  AKI, increased creatinine today.  Hyperkalemia Very poor candidate for HD P:  Monitor I/O, monitor kidney function.  Continue IVF. Nephrology following the patient  CRRT Per  nephrology  GASTROINTESTINAL A:  Morbid obesity Abdominal pain P:  SUP: IV PPI TF protocol  . HEMATOLOGIC A:  Anemia with microcytosis - no overt bleeding P:  DVT px: full dose heparin heparin Monitor CBC intermittently Transfuse per usual guidelines  INFECTIOUS A:  Possible abdominal sepsis Possible PNA Possible LE cellulitis with prior hx of MRSA cellulitis ESBL in the urine P:  Monitor temp, WBC count Micro and abx as above Continue vanc and meropenem for now  ENDOCRINE A:  Suspect insulin resistance though no history of DM Glucose controlled.  Hypothyroidism P:  IV L-thyroxine. Transition to enteral when able SSI - mod scale  NEUROLOGIC A:  Post arrest anoxic encephalopathy ICU associated discomfort P:  RASS goal: -2, -3 PAD protocol Daily W[facility] CT head was negative for any acute anomaly                 DISCHARGE SUMMARY   Tasha Long is a 60 y.o. y/o female with a PMH of hypothyroidism, asthma,hypothyroidism, morbid obesity. Patient was in chronically poor health and developed abdominal pain thereafter suffered respiratory arrest and brief period of cardiac arrest in the transfer from her bed to wheelchair and into ambulance.  Intubated and transported to ED suffered brief cardiac arrest again in ICU shortly after arrival. Patient was initiated on Heparin for possible PE, She was started on broad spectrum antibiotics.  Patient did not show any signs of improvement , she remained obtunded.  She is off sedation and  still no purposeful movements .  Patient had Her CT of head with no acute anomaly. She remained critically ill with multiple organ system failure, her renal function started declining and as per the nephrologist her overall  Prognosis will not change due to dialysis.  Neurology was involved and her poor prognosis status was communicated to the family. On 4/19 we family meeting was held involving Dr. Stevenson Long and Dr,  Tasha Long about the poor prognosis of the patient and trach and peg if they decide for the continuity of the care and therefore family wants  To take her to [facility] for further management/care.      SIGNIFICANT DIAGNOSTIC STUDIES  04/14 TTE: pending.  04/14 LE venous US: negative.  03/25/16; abdomen US: negative, but incomplete exam.    SIGNIFICANT EVENTS PEA cardiac arrest 4/14 Intubated 4/14  MICRO DATA  Urine 04/14 >> Positive for E- Coli Resp 04/14 >> pending.  Blood 04/14 >> negative.  MRSA PCR positive.    ANTIBIOTICS Vanc 04/14 >>  Metronidazole 04/14 >>  Cefepime 04/14 >>  Meropenem 4/19>>   CONSULTS 4/16 nephro>>  4/19 neuro>>  4/20 wound>>  TUBES / LINES INDWELLING DEVICES:: ETT 04/14 >>  R [place] CVL 04/14 >>      Filed Vitals:   03/31/16 0700 03/31/16 0800 03/31/16 0900 03/31/16 0915  BP: 139/58 115/57 130/76   Pulse: 92 88 92   Temp: 97.3 F (36.3 C) 97.5 F (36.4 C) 97.2 F (36.2 C)   TempSrc:      Resp: _0 Height:      Weight:      SpO2: 99% 98% 97% 98%     Discharge Labs  BMET  Recent Labs Lab 03/26/16 0535 03/27/16 0352 03/28/16 0349 03/29/16 0552 03/30/16 0401 03/31/16 0520  NA 133* 134* 133* 135 136 138  K 4.0 4.1 4.1 4.2 4.4 4.6  CL 99* 101 102 105 105 106  CO2 _1 GLUCOSE 86 90 79 83 77 104*  BUN 22* 24* 29* 32* 34* 44*  CREATININE 1.74* 2.23* 2.92* 3.46* 4.12* 4.57*  CALCIUM 8.3* 7.9* 8.0* 7.9* 8.1* 9.0  MG  --  2.1  --   --   --   --   PHOS 4.6 5.5*  --  5.4* 5.6*  --     CBC  Recent Labs Lab 03/29/16 0552 03/30/16 0401 03/31/16 0526  HGB 7.7* 7.6* 8.5*  HCT 24.8* 24.4* 27.4*  WBC 5.4 6.5 9.1  PLT 184 190 191    Anti-Coagulation  Recent Labs Lab 03/25/16 0719 03/25/16 1303  INR SPECIMEN CLOTTED 1.36            Medication List    Notice    You have not been prescribed any medications.       Disposition: [place]  Discharged Condition: Tasha Long has met maximum benefit of inpatient care and is medically stable and cleared for discharge.  Patient is pending follow up as above.      Time spent on disposition:  Greater than 45 minutes.    [facility]

## 2016-03-31 NOTE — Progress Notes (Signed)
ANTICOAGULATION CONSULT NOTE - Initial Consult  Pharmacy Consult for Heparin Indication: pulmonary embolus  Allergies  Allergen Reactions  . Codeine Hives  . Sulfa Antibiotics Hives   Patient Measurements: Height: _0  (165.1 cm) Weight: (!) 395 lb 11.6 oz (179.5 kg) IBW/kg (Calculated) : 57 Heparin Dosing Weight: 93.6 kg  Vital Signs: Temp: 97 F (36.1 C) (04/20 0500) BP: 136/68 mmHg (04/20 0500) Pulse Rate: 95 (04/20 0500)  Labs:  Recent Labs  03/29/16 0552 03/30/16 0401 03/31/16 0526 03/31/16 0626  HGB 7.7* 7.6* 8.5*  --   HCT 24.8* 24.4* 27.4*  --   PLT 184 190 191  --   HEPARINUNFRC 0.46 0.37  --  0.49  CREATININE 3.46* 4.12*  --   --     Estimated Creatinine Clearance: 24.9 mL/min (by C-G formula based on Cr of 4.12).   Medical History: History reviewed. No pertinent past medical history.  Medications:  Scheduled:  . antiseptic oral rinse  7 mL Mouth Rinse 10 times per day  . budesonide (PULMICORT) nebulizer solution  0.25 mg Nebulization 4 times per day  . chlorhexidine gluconate (SAGE KIT)  15 mL Mouth Rinse BID  . Chlorhexidine Gluconate Cloth  6 each Topical Q0600  . feeding supplement (PRO-STAT SUGAR FREE 64)  60 mL Per Tube TID  . feeding supplement (VITAL HIGH PROTEIN)  1,000 mL Per Tube Q24H  . fentaNYL (SUBLIMAZE) injection  50 mcg Intravenous Once  . insulin aspart  0-15 Units Subcutaneous 6 times per day  . ipratropium-albuterol  3 mL Nebulization Q6H  . lactulose  20 g Oral BID  . levothyroxine  37.5 mcg Intravenous Daily  . meropenem (MERREM) IV  500 mg Intravenous Q24H  . metoCLOPramide (REGLAN) injection  5 mg Intravenous Q6H  . mupirocin ointment  1 application Nasal BID  . pantoprazole (PROTONIX) IV  40 mg Intravenous QHS  . polyethylene glycol  17 g Oral BID  . senna-docusate  1 tablet Oral BID   Infusions:  . fentaNYL infusion INTRAVENOUS Stopped (03/29/16 0837)  . heparin 2,000 Units/hr (03/31/16 0301)  . midazolam (VERSED)  infusion Stopped (03/29/16 0837)  . norepinephrine Stopped (03/28/16 2331)    Assessment: 60 y/o F admitted with respiratory arrest ordered heparin for possible PE.   Goal of Therapy:  Heparin level 0.3-0.7 units/ml Monitor platelets by anticoagulation protocol: Yes   Plan:  Heparin level subtherapeutic. 1500 unit IV x 1 bolus and increase rate to 2000 units/hr. Will recheck level in 6 hours.  4/17 at 21:52  HL = 0.56. Will continue current dosing.  Will recheck HL with AM labs.   4/18 AM heparin level 0.46. Continue current regimen. Recheck with CBC tomorrow AM.  4/20 AM heparin level 0.49. Continue current regimen. Recheck with CBC tomorrow AM.   4/19 AM heparin level 0.37. Continue current regimen. Recheck with CBC tomorrow AM.   Eloise Harman, RPh Clinical Pharmacist 03/31/2016,6:52 AM

## 2016-04-01 ENCOUNTER — Inpatient Hospital Stay: Payer: Medicaid Other

## 2016-04-01 DIAGNOSIS — N179 Acute kidney failure, unspecified: Secondary | ICD-10-CM | POA: Insufficient documentation

## 2016-04-01 DIAGNOSIS — R4182 Altered mental status, unspecified: Secondary | ICD-10-CM

## 2016-04-01 DIAGNOSIS — R600 Localized edema: Secondary | ICD-10-CM

## 2016-04-01 DIAGNOSIS — A419 Sepsis, unspecified organism: Secondary | ICD-10-CM

## 2016-04-01 LAB — COMPREHENSIVE METABOLIC PANEL
ALT: 9 U/L — AB (ref 14–54)
AST: 17 U/L (ref 15–41)
Albumin: 2.3 g/dL — ABNORMAL LOW (ref 3.5–5.0)
Alkaline Phosphatase: 28 U/L — ABNORMAL LOW (ref 38–126)
Anion gap: 7 (ref 5–15)
BUN: 53 mg/dL — ABNORMAL HIGH (ref 6–20)
CHLORIDE: 107 mmol/L (ref 101–111)
CO2: 26 mmol/L (ref 22–32)
CREATININE: 4.93 mg/dL — AB (ref 0.44–1.00)
Calcium: 8.8 mg/dL — ABNORMAL LOW (ref 8.9–10.3)
GFR, EST AFRICAN AMERICAN: 10 mL/min — AB (ref >60)
GFR, EST NON AFRICAN AMERICAN: 9 mL/min — AB (ref >60)
Glucose, Bld: 117 mg/dL — ABNORMAL HIGH (ref 65–99)
POTASSIUM: 4.3 mmol/L (ref 3.5–5.1)
Sodium: 140 mmol/L (ref 135–145)
Total Bilirubin: 0.5 mg/dL (ref 0.3–1.2)
Total Protein: 6.6 g/dL (ref 6.5–8.1)

## 2016-04-01 LAB — BLOOD GAS, ARTERIAL
ALLENS TEST (PASS/FAIL): POSITIVE — AB
Acid-base deficit: 1.4 mmol/L (ref 0.0–2.0)
BICARBONATE: 25.5 meq/L (ref 21.0–28.0)
FIO2: 0.35
MECHVT: 500 mL
O2 Saturation: 94.2 %
PEEP/CPAP: 5 cmH2O
Patient temperature: 37
RATE: 26 resp/min
pCO2 arterial: 53 mmHg — ABNORMAL HIGH (ref 32.0–48.0)
pH, Arterial: 7.29 — ABNORMAL LOW (ref 7.350–7.450)
pO2, Arterial: 80 mmHg — ABNORMAL LOW (ref 83.0–108.0)

## 2016-04-01 LAB — HEPARIN LEVEL (UNFRACTIONATED): Heparin Unfractionated: 0.5 IU/mL (ref 0.30–0.70)

## 2016-04-01 LAB — GLUCOSE, CAPILLARY
GLUCOSE-CAPILLARY: 103 mg/dL — AB (ref 65–99)
GLUCOSE-CAPILLARY: 129 mg/dL — AB (ref 65–99)
GLUCOSE-CAPILLARY: 117 mg/dL — AB (ref 65–99)
Glucose-Capillary: 112 mg/dL — ABNORMAL HIGH (ref 65–99)
Glucose-Capillary: 115 mg/dL — ABNORMAL HIGH (ref 65–99)

## 2016-04-01 LAB — CBC
HCT: 25.8 % — ABNORMAL LOW (ref 35.0–47.0)
Hemoglobin: 7.8 g/dL — ABNORMAL LOW (ref 12.0–16.0)
MCH: 23 pg — ABNORMAL LOW (ref 26.0–34.0)
MCHC: 30.3 g/dL — AB (ref 32.0–36.0)
MCV: 75.8 fL — ABNORMAL LOW (ref 80.0–100.0)
PLATELETS: 178 10*3/uL (ref 150–440)
RBC: 3.41 MIL/uL — ABNORMAL LOW (ref 3.80–5.20)
RDW: 19.4 % — ABNORMAL HIGH (ref 11.5–14.5)
WBC: 6.9 10*3/uL (ref 3.6–11.0)

## 2016-04-01 LAB — VANCOMYCIN, RANDOM: VANCOMYCIN RM: 27 ug/mL

## 2016-04-01 NOTE — Progress Notes (Signed)
PULMONARY / CRITICAL CARE MEDICINE   Name: Tasha Long MRN: [id number] DOB: 05-31-1956    ADMISSION DATE:  03/25/2016  BRIEF HISTORY: 60 year old female in chronically poor health, admitted to the ICU after suffering a brief cardiopulmonary arrest, PEA and 03/25/2016. Patient has past medical history significant for hypertension, hyperlipidemia, COPD, morbid obesity, hypothyroidism. Since admission to the ICU for active problems are: Acute on chronic renal failure Acute respiratory failure requiring mechanical ventilation with multilobar pneumonia ESBL Escherichia coli UTI Anemia Neurologic depressed state  SUBJECTIVE:  No significant neurologic improvement. Family agreed to start crrt today.    MAJOR EVENTS/TEST RESULTS: 04/14 TTE: 0000000, G1 diastrolic dysfcn, mild LA dil, mod RV dil, mild RA Dil. 4 chamber dilation, with mod sys dysfcn and mild diastolic dysfcn w/diffuse hypokinesis 04/14 LE venous US: negative.  03/25/16; abdomen US: negative, but incomplete exam.    INDWELLING DEVICES:: ETT 04/14 >>  R [place] CVL 04/14 >>   MICRO DATA: Urine 04/14 >> Positive for E- Coli Resp 04/14 >> pending.  Blood 04/14 >> negative.  MRSA PCR positive.   PCT algorithm 04/14: 1.48  ANTIMICROBIALS:  Vanc 04/14 >>  Metronidazole 04/14 >>  Cefepime 04/14 >>  Meropenem 4/19>>  VITAL SIGNS: Temp:  [96.8 F (36 C)-98.2 F (36.8 C)] 98.2 F (36.8 C) (04/21 0600) Pulse Rate:  [90-102] 101 (04/21 0600) Resp:  [16-25] 25 (04/21 0600) BP: (117-141)/(42-76) 141/58 mmHg (04/21 0600) SpO2:  [95 %-98 %] 96 % (04/21 0600) FiO2 (%):  [35 %] 35 % (04/21 0243) Weight:  [394 lb 13.5 oz (179.1 kg)] 394 lb 13.5 oz (179.1 kg) (04/21 0641) HEMODYNAMICS: CVP:  [21 mmHg-27 mmHg] 24 mmHg VENTILATOR SETTINGS: Vent Mode:  [-] PRVC FiO2 (%):  [35 %] 35 % Set Rate:  [26 bmp] 26 bmp Vt Set:  [500 mL] 500 mL PEEP:  [5 cmH20] 5 cmH20 INTAKE / OUTPUT:  Intake/Output Summary  (Last 24 hours) at 04/01/16 [id number] Last data filed at 04/01/16 0600  Gross per 24 hour  Intake   1449 ml  Output    150 ml  Net   1299 ml    Review of Systems  Unable to perform ROS: intubated    Physical Exam General Appearance: Very sickly appearing white female Neuro:Atraumatic, normocephalic,Grimaces to pain HEENT: Pupils sluggishly reacting to light,no JVD appreciated, no discharge noted. Pulmonary: Clear bilaterally, no weezes, crackles, rhonchi noted  Cardiovascular Normal S1,S2. NoMRG, RRR  Abdomen Obese, round, hypoactive bowel sounds Renal: No costovertebral tenderness  GU: not performed Endocrine: No evident thyromegaly. Skin: warm,, no ecchymosis ,cellulitis present , red Extremities: +3 pitting edema, warm to touch, red  LABS:  CBC  Recent Labs Lab 03/30/16 0401 03/31/16 0526 04/01/16 0442  WBC 6.5 9.1 6.9  HGB 7.6* 8.5* 7.8*  HCT 24.4* 27.4* 25.8*  PLT 190 191 178   Coag's  Recent Labs Lab 03/25/16 1303  APTT 79*  INR 1.36   BMET  Recent Labs Lab 03/30/16 0401 03/31/16 0520 04/01/16 0442  NA 136 138 140  K 4.4 4.6 4.3  CL 105 106 107  CO2 22 23 26   BUN 34* 44* 53*  CREATININE 4.12* 4.57* 4.93*  GLUCOSE 77 104* 117*   Electrolytes  Recent Labs Lab 03/27/16 0352  03/29/16 0552 03/30/16 0401 03/31/16 0520 04/01/16 0442  CALCIUM 7.9*  < > 7.9* 8.1* 9.0 8.8*  MG 2.1  --   --   --   --   --   PHOS 5.5*  --  5.4* 5.6*  --   --   < > = values in this interval not displayed. Sepsis Markers  Recent Labs Lab 03/25/16 1303 03/26/16 0535 03/27/16 0352  LATICACIDVEN 1.4  --   --   PROCALCITON  --  1.48 1.59   ABG  Recent Labs Lab 03/27/16 0500 03/27/16 1200 03/28/16 0500  PHART 7.24* 7.25* 7.40  PCO2ART 66* 59* 40  PO2ART 77* 69* 93   Liver Enzymes  Recent Labs Lab 03/25/16 2228 03/28/16 0349 03/29/16 0552 03/30/16 0401 04/01/16 0442  AST 30 29  --   --  17  ALT 12* 11*  --   --  9*  ALKPHOS 31* 30*  --    --  28*  BILITOT 0.6 1.0  --   --  0.5  ALBUMIN 2.6* 2.4* 2.2* 2.1* 2.3*   Cardiac Enzymes  Recent Labs Lab 03/25/16 1529 03/25/16 2228  TROPONINI 0.81* 0.42*   Glucose  Recent Labs Lab 03/31/16 1204 03/31/16 1605 03/31/16 2011 03/31/16 2342 04/01/16 0452 04/01/16 0748  GLUCAP 97 87 112* 110* 103* 112*    Imaging No results found.  ASSESSMENT / PLAN: 60 F in chronically very poor health developed abdominal pain and EMS was dispatched. 60 F in chronically very poor health developed abdominal pain and EMS was dispatched. Suffered respiratory arrest and brief cardiac arrest in the transfer from her bed to wheelchair and into ambulance. Intubated and transported to ED. Suffered brief PEA arrest again in ICU shortly after arrival   PULMONARY A: Respiratory arrest COPD OSA Smoker Concern for PE given numerous RFs - no CTA given AKI.  Respiratory acidosis P:  Continue anticoagulation empirically.  Continue vent support, not weaning candidate at this time.  Continue Albuterol, pulmicort Nebs.  Vent adjustment, wean, per protocol   CARDIOVASCULAR A:  Cardiac arrest due to respiratory arrest Hypertension Hyperlipidemia  P:   Echo indicative of Four-chamber dilated patient, with moderate left ventricle  systolic dysfunction and mild diastolic dysfunction with diffuse  hypokinesis  MAP goal > 65 mmHg CVL placed  RENAL A:  AKI, increased creatinine today.  Hyperkalemia Very poor candidate for HD P:  Monitor I/O, monitor kidney function.  Continue IVF. Nephrology following the patient  CRRT Per nephrology - starting 4/21  GASTROINTESTINAL A:  Morbid obesity Abdominal pain P:  SUP: IV PPI TF protocol  . HEMATOLOGIC A:  Anemia with microcytosis - no overt bleeding P:  DVT px: full dose heparin heparin Monitor CBC intermittently Transfuse per usual guidelines  INFECTIOUS A:  Possible abdominal sepsis Possible PNA Possible LE cellulitis with prior hx of MRSA cellulitis ESBL in the urine P:   Monitor temp, WBC count Micro and abx as above Continue vanc and meropenem for now  ENDOCRINE A:  Suspect insulin resistance though no history of DM Glucose controlled.  Hypothyroidism P:  IV L-thyroxine. Transition to enteral when able SSI - mod scale  NEUROLOGIC A:  Post arrest anoxic encephalopathy ICU associated discomfort P:  RASS goal: -2, -3 PAD protocol Daily WUA CT head was negative for any acute abnormality  Thank you for consulting [facility], Please feel free to contacts [place] with any questions at [phone] (please enter 7-digits).  I have personally obtained a history, examined the patient, evaluated laboratory and imaging results, formulated the assessment and plan and placed orders.  The Patient requires high complexity decision making for assessment and support, frequent evaluation and titration of therapies, application of advanced monitoring technologies and extensive interpretation of multiple databases. Critical Care Time devoted to patient  care services described in this note is 35 minutes.   Overall, patient is critically ill, prognosis is guarded. Patient at high risk for cardiac arrest and death.    Vilinda Boehringer, MD [facility] Pager [phone] (please enter 7-digits) On Call Pager [phone] (please enter 7-digits)  Note: This note was prepared with Dragon dictation along with smaller phrase technology. Any transcriptional errors that result from this process are unintentional.

## 2016-04-01 NOTE — Progress Notes (Signed)
Pharmacy Antibiotic Note  Tasha Long is a 60 y.o. female admitted on 03/25/2016 with respiratory distress ordered empiric abx and ESBL Ecoli.  Pharmacy has been consulted for vancomycin and Merrem   Plan: Repeat vancomycin level 27  mcg/mL. Continue to hold vancomycin. Will recheck random level with am labs on Sunday. Renal function continues to worsen in this patient.    Will continue  Merrem 500 mg IV q 24 hours since patient is in AKI and serum creatinine continues to increase. Dosed as if patient's CrCl <10 ml/min since Crcl can't accurately estimated renal function in AKI.   Height: 5\' 5"  (165.1 cm) Weight: (!) 394 lb 13.5 oz (179.1 kg) IBW/kg (Calculated) : 57  Temp (24hrs), Avg:97.9 F (36.6 C), Min:97 F (36.1 C), Max:98.2 F (36.8 C)   Recent Labs Lab 03/25/16 1303  03/27/16 0352 03/27/16 1626 03/28/16 0349  03/29/16 0552 03/29/16 1508 03/30/16 0401 03/31/16 0520 03/31/16 0526 04/01/16 0442 04/01/16 0805  WBC  --   < > 7.0  --  5.4  --  5.4  --  6.5  --  9.1 6.9  --   CREATININE  --   < > 2.23*  --  2.92*  --  3.46*  --  4.12* 4.57*  --  4.93*  --   LATICACIDVEN 1.4  --   --   --   --   --   --   --   --   --   --   --   --   VANCOTROUGH  --   --  55* 46*  --   --   --   --   --   --   --   --   --   VANCORANDOM  --   --   --   --   --   < >  --  37  --   --   --   --  27  < > = values in this interval not displayed.  Estimated Creatinine Clearance: 20.8 mL/min (by C-G formula based on Cr of 4.93).    Allergies  Allergen Reactions  . Codeine Hives  . Sulfa Antibiotics Hives    Antimicrobials this admission: Zosyn 4/14 >> 4/14 vancomycin 4/14 >>  Cefepime 4/14 >> 4/17 Metronidazole 4/14 >> 4/17 Meropenem 4/17 >>  Dose adjustments this admission: Merrem 500 mg IV q24 hours 4/18   Microbiology results: 4/19 TA: pending 4/14 BCx: negative UCx: ESBL E coli MRSA PCR: positive  Thank you for allowing pharmacy to be a part of this  patient's care.  Tarquin Welcher D, Pharm.D. Clinical Pharmacist 04/01/2016 12:06 PM

## 2016-04-01 NOTE — Progress Notes (Signed)
Chaplain rounded the unit and provided a compassionate presence and spiritual support to the patient.  Chaplain Alisa Stjames [phone] 

## 2016-04-01 NOTE — Progress Notes (Signed)
Nurse approached by pt's daughter Tasha Long in regards to starting dialysis. Paged and spoke with Dr. Juleen China. Stated he will be up to speak with daughter today to get consent for dialysis to begin today.

## 2016-04-01 NOTE — Progress Notes (Signed)
ANTICOAGULATION CONSULT NOTE - Initial Consult  Pharmacy Consult for Heparin Indication: pulmonary embolus  Allergies  Allergen Reactions  . Codeine Hives  . Sulfa Antibiotics Hives   Patient Measurements: Height: 5' 5" (165.1 cm) Weight: (!) 395 lb 11.6 oz (179.5 kg) IBW/kg (Calculated) : 57 Heparin Dosing Weight: 93.6 kg  Vital Signs: Temp: 98.2 F (36.8 C) (04/21 0500) Temp Source: Core (Comment) (04/20 1950) BP: 137/52 mmHg (04/21 0500) Pulse Rate: 97 (04/21 0500)  Labs:  Recent Labs  03/30/16 0401 03/31/16 0520 03/31/16 0526 03/31/16 0626 04/01/16 0442  HGB 7.6*  --  8.5*  --  7.8*  HCT 24.4*  --  27.4*  --  25.8*  PLT 190  --  191  --  178  HEPARINUNFRC 0.37  --   --  0.49 0.50  CREATININE 4.12* 4.57*  --   --  4.93*    Estimated Creatinine Clearance: 20.8 mL/min (by C-G formula based on Cr of 4.93).   Medical History: History reviewed. No pertinent past medical history.  Medications:  Scheduled:  . antiseptic oral rinse  7 mL Mouth Rinse 10 times per day  . budesonide (PULMICORT) nebulizer solution  0.25 mg Nebulization 4 times per day  . chlorhexidine gluconate (SAGE KIT)  15 mL Mouth Rinse BID  . Chlorhexidine Gluconate Cloth  6 each Topical Q0600  . feeding supplement (PRO-STAT SUGAR FREE 64)  30 mL Per Tube QID  . fentaNYL (SUBLIMAZE) injection  50 mcg Intravenous Once  . insulin aspart  0-15 Units Subcutaneous 6 times per day  . ipratropium-albuterol  3 mL Nebulization Q6H  . lactulose  20 g Oral BID  . levothyroxine  37.5 mcg Intravenous Daily  . meropenem (MERREM) IV  500 mg Intravenous Q24H  . mupirocin ointment  1 application Nasal BID  . pantoprazole (PROTONIX) IV  40 mg Intravenous QHS  . polyethylene glycol  17 g Oral BID  . senna-docusate  1 tablet Oral BID   Infusions:  . feeding supplement (VITAL HIGH PROTEIN) 1,000 mL (03/31/16 1433)  . fentaNYL infusion INTRAVENOUS Stopped (03/29/16 0837)  . heparin 2,000 Units/hr (03/31/16  0301)  . midazolam (VERSED) infusion Stopped (03/29/16 0837)  . norepinephrine Stopped (03/28/16 2331)    Assessment: 60 y/o F admitted with respiratory arrest ordered heparin for possible PE.   Goal of Therapy:  Heparin level 0.3-0.7 units/ml Monitor platelets by anticoagulation protocol: Yes   Plan:  Heparin level subtherapeutic. 1500 unit IV x 1 bolus and increase rate to 2000 units/hr. Will recheck level in 6 hours.  4/17 at 21:52  HL = 0.56. Will continue current dosing.  Will recheck HL with AM labs.   4/18 AM heparin level 0.46. Continue current regimen. Recheck with CBC tomorrow AM.  4/19 AM heparin level 0.37. Continue current regimen. Recheck with CBC tomorrow AM.   4/20 AM heparin level 0.49. Continue current regimen. Recheck with CBC tomorrow AM.   4/21 AM heparin level 0.50. Continue current regimen. Recheck with CBC tomorrow AM.   Eloise Harman, RPh Clinical Pharmacist 04/01/2016,6:04 AM

## 2016-04-01 NOTE — Progress Notes (Signed)
[facility]  ROUNDING NOTE   Subjective:   Multiple family members around throughout the morning.   UOP 150   Objective:  Vital signs in last 24 hours:  Temp:  [97.2 F (36.2 C)-98.2 F (36.8 C)] 97.7 F (36.5 C) (04/21 1000) Pulse Rate:  [90-102] 96 (04/21 1000) Resp:  [16-25] 19 (04/21 1000) BP: (117-141)/(42-62) 134/47 mmHg (04/21 1000) SpO2:  [95 %-97 %] 97 % (04/21 1135) FiO2 (%):  [35 %] 35 % (04/21 1135) Weight:  [179.1 kg (394 lb 13.5 oz)] 179.1 kg (394 lb 13.5 oz) (04/21 0641)  Weight change: -0.4 kg (-14.1 oz) Filed Weights   03/30/16 0438 03/31/16 0500 04/01/16 0641  Weight: 177.3 kg (390 lb 14 oz) 179.5 kg (395 lb 11.6 oz) 179.1 kg (394 lb 13.5 oz)    Intake/Output: I/O last 3 completed shifts: In: 2129.3 [I.V.:720; Other:60; NG/GT:1299.3; IV Piggyback:50] Out: 390 [Urine:390]   Intake/Output this shift:  Total I/O In: 270 [I.V.:40; NG/GT:230] Out: -   Physical Exam: General: Critically ill  Head: ETT OGT  Eyes: Eyes closed  Neck: trachea midline  Lungs:  Coarse breath sounds, PRVC FiO2 35%  Heart: Regular rate and rhythm  Abdomen:  Soft, nontender, obese, no bowel sounds  Extremities: nonpitting peripheral edema.  Neurologic: Sedated, intubated  Skin: Erythema bilateral lower extremities  Access: vascath 4/17 Dr. Mortimer Fries    Basic Metabolic Panel:  Recent Labs Lab 03/26/16 0535 03/27/16 3007 03/28/16 0349 03/29/16 0552 03/30/16 0401 03/31/16 0520 04/01/16 0442  NA 133* 134* 133* 135 136 138 140  K 4.0 4.1 4.1 4.2 4.4 4.6 4.3  CL 99* 101 102 105 105 106 107  CO2 _0 GLUCOSE 86 90 79 83 77 104* 117*  BUN 22* 24* 29* 32* 34* 44* 53*  CREATININE 1.74* 2.23* 2.92* 3.46* 4.12* 4.57* 4.93*  CALCIUM 8.3* 7.9* 8.0* 7.9* 8.1* 9.0 8.8*  MG  --  2.1  --   --   --   --   --   PHOS 4.6 5.5*  --  5.4* 5.6*  --   --     Liver Function Tests:  Recent Labs Lab 03/25/16 2228 03/28/16 0349 03/29/16 0552  03/30/16 0401 04/01/16 0442  AST 30 29  --   --  17  ALT 12* 11*  --   --  9*  ALKPHOS 31* 30*  --   --  28*  BILITOT 0.6 1.0  --   --  0.5  PROT 6.3* 6.3*  --   --  6.6  ALBUMIN 2.6* 2.4* 2.2* 2.1* 2.3*   No results for input(s): LIPASE, AMYLASE in the last 168 hours. No results for input(s): AMMONIA in the last 168 hours.  CBC:  Recent Labs Lab 03/28/16 0349 03/29/16 0552 03/30/16 0401 03/31/16 0526 04/01/16 0442  WBC 5.4 5.4 6.5 9.1 6.9  HGB 8.1* 7.7* 7.6* 8.5* 7.8*  HCT 25.7* 24.8* 24.4* 27.4* 25.8*  MCV 75.0* 73.6* 75.2* 74.2* 75.8*  PLT 188 184 190 191 178    Cardiac Enzymes:  Recent Labs Lab 03/25/16 1529 03/25/16 2228  TROPONINI 0.81* 0.42*    BNP: Invalid input(s): POCBNP  CBG:  Recent Labs Lab 03/31/16 2011 03/31/16 2342 04/01/16 0452 04/01/16 0748 04/01/16 1148  GLUCAP 112* 110* 103* 112* 129*    Microbiology: Results for orders placed or performed during the hospital encounter of 03/25/16  Culture, blood (routine x 2)     Status: None  Collection Time: 03/25/16  7:19 AM  Result Value Ref Range Status   Specimen Description BLOOD LEFT FOREARM  Final   Special Requests BOTTLES DRAWN AEROBIC AND ANAEROBIC  1CC  Final   Culture NO GROWTH 5 DAYS  Final   Report Status 03/30/2016 FINAL  Final  Culture, blood (routine x 2)     Status: None   Collection Time: 03/25/16  7:19 AM  Result Value Ref Range Status   Specimen Description BLOOD LEFT HAND  Final   Special Requests BOTTLES DRAWN AEROBIC AND ANAEROBIC  1CC  Final   Culture NO GROWTH 5 DAYS  Final   Report Status 03/30/2016 FINAL  Final  MRSA PCR Screening     Status: Abnormal   Collection Time: 03/25/16 12:42 PM  Result Value Ref Range Status   MRSA by PCR POSITIVE (A) NEGATIVE Final    Comment:        The GeneXpert MRSA Assay (FDA approved for NASAL specimens only), is one component of a comprehensive MRSA colonization surveillance program. It is not intended to diagnose  MRSA infection nor to guide or monitor treatment for MRSA infections. CRITICAL RESULT CALLED TO, READ BACK BY AND VERIFIED WITH:  AMELIA BERRY AT 1644 03/25/16 SDR   Urine culture     Status: Abnormal   Collection Time: 03/26/16  1:02 AM  Result Value Ref Range Status   Specimen Description URINE, RANDOM  Final   Special Requests NONE  Final   Culture (A)  Final    20,000 COLONIES/mL ESCHERICHIA COLI Results Called to: AMELIA BERRY, RN  AT 1055 ON 03/28/16 BY CTJ. ESBL-EXTENDED SPECTRUM BETA LACTAMASE-THE ORGANISM IS RESISTANT TO PENICILLINS, CEPHALOSPORINS AND AZTREONAM ACCORDING TO CLSI M100-S15 VOL.[place].    Report Status 03/28/2016 FINAL  Final   Organism ID, Bacteria ESCHERICHIA COLI (A)  Final      Susceptibility   Escherichia coli - MIC*    AMPICILLIN >=32 RESISTANT Resistant     CEFAZOLIN >=64 RESISTANT Resistant     CEFTRIAXONE >=64 RESISTANT Resistant     CIPROFLOXACIN >=4 RESISTANT Resistant     GENTAMICIN <=1 SENSITIVE Sensitive     IMIPENEM <=0.25 SENSITIVE Sensitive     NITROFURANTOIN <=16 SENSITIVE Sensitive     TRIMETH/SULFA >=320 RESISTANT Resistant     AMPICILLIN/SULBACTAM 16 INTERMEDIATE Intermediate     PIP/TAZO <=4 SENSITIVE Sensitive     Extended ESBL POSITIVE Resistant     * 20,000 COLONIES/mL ESCHERICHIA COLI  Culture, respiratory (tracheal aspirate)     Status: None (Preliminary result)   Collection Time: 03/30/16  8:50 AM  Result Value Ref Range Status   Specimen Description TRACHEAL ASPIRATE  Final   Special Requests NONE  Final   Gram Stain FEW WBC SEEN RARE GRAM NEGATIVE RODS   Final   Culture   Final    MODERATE GROWTH STAPHYLOCOCCUS AUREUS SUSCEPTIBILITIES TO FOLLOW    Report Status PENDING  Incomplete    Coagulation Studies: No results for input(s): LABPROT, INR in the last 72 hours.  Urinalysis: No results for input(s): COLORURINE, LABSPEC, PHURINE, GLUCOSEU, HGBUR, BILIRUBINUR, KETONESUR, PROTEINUR, UROBILINOGEN, NITRITE,  LEUKOCYTESUR in the last 72 hours.  Invalid input(s): APPERANCEUR    Imaging: Dg Chest Port 1 View  04/01/2016  CLINICAL DATA:  Respiratory arrest, cardiopulmonary arrest with resuscitation, morbid obesity, asthma, former smoker. EXAM: PORTABLE CHEST 1 VIEW COMPARISON:  Portable chest x-ray of March 28, 2016 FINDINGS: The lungs are well-expanded. The hemidiaphragms are excluded from the  field of view however. The heart is normal in size. The central pulmonary vascularity is engorged. The pulmonary interstitial markings remain increased. The endotracheal tube tip lies 2.7 cm above the carina. The esophagogastric tube tip projects below the inferior margin of the image. The dialysis catheter tip projects below the inferior margin of the image is well. The right subclavian venous catheter tip projects over the proximal SVC. IMPRESSION: Limited study with exclusion of the hemidiaphragms. There is persistent pulmonary interstitial edema and mild cardiomegaly. There is no pneumothorax. The support tubes are in reasonable position where visualized. Electronically Signed   By: David  Martinique M.D.   On: 04/01/2016 10:13     Medications:   . feeding supplement (VITAL HIGH PROTEIN) 1,000 mL (04/01/16 0800)  . fentaNYL infusion INTRAVENOUS Stopped (03/29/16 0837)  . heparin 2,000 Units/hr (04/01/16 0800)  . midazolam (VERSED) infusion Stopped (03/29/16 0837)  . norepinephrine Stopped (03/28/16 2331)   . antiseptic oral rinse  7 mL Mouth Rinse 10 times per day  . budesonide (PULMICORT) nebulizer solution  0.25 mg Nebulization 4 times per day  . chlorhexidine gluconate (SAGE KIT)  15 mL Mouth Rinse BID  . Chlorhexidine Gluconate Cloth  6 each Topical Q0600  . feeding supplement (PRO-STAT SUGAR FREE 64)  30 mL Per Tube QID  . fentaNYL (SUBLIMAZE) injection  50 mcg Intravenous Once  . insulin aspart  0-15 Units Subcutaneous 6 times per day  . ipratropium-albuterol  3 mL Nebulization Q6H  . lactulose  20 g  Oral BID  . levothyroxine  37.5 mcg Intravenous Daily  . meropenem (MERREM) IV  500 mg Intravenous Q24H  . mupirocin ointment  1 application Nasal BID  . pantoprazole (PROTONIX) IV  40 mg Intravenous QHS  . polyethylene glycol  17 g Oral BID  . senna-docusate  1 tablet Oral BID   sodium chloride, acetaminophen, albuterol, fentaNYL, midazolam, ondansetron (ZOFRAN) IV, sodium chloride flush  Assessment/ Plan:  Ms. Tasha Long is a 60 y.o. 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   1. Acute renal failure on chronic kidney disease stage III with baseline creatinine of 1.2, eGFR of 49: nonoliguric. Secondary to ATN. With elevated vancomycin levels. On vasopressors. Not responsive to IV diuretics.  Overall prognosis poor. Discussed dialysis with patient's family,  Will proceed with initiating intermittent hemodialysis today. Orders prepared. Risks and benefits explained to daughter and daughter in law. Minimal UF.   3. Acute respiratory failure: intubated on mechanical ventilation. with multilobar pneumonia.  - meropenem. Continue supportive care  3. Sepsis/Hypotension secondary to ESBL E. Coli Urinary tract infection: no longer on vasopressors.   4. Anemia:  - low threshold for epo and/or PRBC transfusion.    LOS: 7 Martie Fulgham 4/21/20171:30 PM

## 2016-04-01 NOTE — Progress Notes (Signed)
Subjective: Patient remains unresponsive.    Objective: Current vital signs: BP 129/50 mmHg  Pulse 97  Temp(Src) 97.7 F (36.5 C) (Core (Comment))  Resp 17  Ht 5' 5"  (1.651 m)  Wt 179.1 kg (394 lb 13.5 oz)  BMI 65.71 kg/m2  SpO2 96% Vital signs in last 24 hours: Temp:  [96.8 F (36 C)-98.2 F (36.8 C)] 97.7 F (36.5 C) (04/21 0900) Pulse Rate:  [90-102] 97 (04/21 0900) Resp:  [16-25] 17 (04/21 0900) BP: (117-141)/(42-64) 129/50 mmHg (04/21 0900) SpO2:  [95 %-97 %] 96 % (04/21 0900) FiO2 (%):  [35 %] 35 % (04/21 0847) Weight:  [179.1 kg (394 lb 13.5 oz)] 179.1 kg (394 lb 13.5 oz) (04/21 0641)  Intake/Output from previous day: 04/20 0701 - 04/21 0700 In: 1509 [I.V.:520; NG/GT:939; IV Piggyback:50] Out: 150 [Urine:150] Intake/Output this shift: Total I/O In: 270 [I.V.:40; NG/GT:230] Out: -  Nutritional status:    Neurologic Exam: Mental Status: Patient does not respond to verbal stimuli. With sternal rub eyes open but patient does not focus. Does not follow commands. No verbalizations noted.  Cranial Nerves: II: patient does not respond confrontation bilaterally, pupils right 2 mm, left 2 mm,and reactive bilaterally III,IV,VI: doll's response absent bilaterally.  V,VII: corneal reflex reduced bilaterally  VIII: patient does not respond to verbal stimuli IX,X: gag reflex absent, XI: trapezius strength unable to test bilaterally XII: tongue strength unable to test Motor: Extremities flaccid throughout. No spontaneous movement noted. No purposeful movements noted. Sensory: Moves lower extremities with noxious stimuli to each extremity Deep Tendon Reflexes:  1+ in the upper extremities Plantars: mute bilaterally Cerebellar: Unable to perform  Lab Results: Basic Metabolic Panel:  Recent Labs Lab 03/26/16 0535 03/27/16 0352 03/28/16 0349 03/29/16 0552 03/30/16 0401 03/31/16 0520 04/01/16 0442  NA 133* 134* 133* 135 136 138 140  K 4.0 4.1 4.1 4.2 4.4  4.6 4.3  CL 99* 101 102 105 105 106 107  CO2 30 27 23 24 22 23 26   GLUCOSE 86 90 79 83 77 104* 117*  BUN 22* 24* 29* 32* 34* 44* 53*  CREATININE 1.74* 2.23* 2.92* 3.46* 4.12* 4.57* 4.93*  CALCIUM 8.3* 7.9* 8.0* 7.9* 8.1* 9.0 8.8*  MG  --  2.1  --   --   --   --   --   PHOS 4.6 5.5*  --  5.4* 5.6*  --   --     Liver Function Tests:  Recent Labs Lab 03/25/16 2228 03/28/16 0349 03/29/16 0552 03/30/16 0401 04/01/16 0442  AST 30 29  --   --  17  ALT 12* 11*  --   --  9*  ALKPHOS 31* 30*  --   --  28*  BILITOT 0.6 1.0  --   --  0.5  PROT 6.3* 6.3*  --   --  6.6  ALBUMIN 2.6* 2.4* 2.2* 2.1* 2.3*   No results for input(s): LIPASE, AMYLASE in the last 168 hours. No results for input(s): AMMONIA in the last 168 hours.  CBC:  Recent Labs Lab 03/28/16 0349 03/29/16 0552 03/30/16 0401 03/31/16 0526 04/01/16 0442  WBC 5.4 5.4 6.5 9.1 6.9  HGB 8.1* 7.7* 7.6* 8.5* 7.8*  HCT 25.7* 24.8* 24.4* 27.4* 25.8*  MCV 75.0* 73.6* 75.2* 74.2* 75.8*  PLT 188 184 190 191 178    Cardiac Enzymes:  Recent Labs Lab 03/25/16 1529 03/25/16 2228  TROPONINI 0.81* 0.42*    Lipid Panel:  Recent Labs Lab 03/27/16 0050  TRIG  89    CBG:  Recent Labs Lab 03/31/16 1605 03/31/16 2011 03/31/16 2342 04/01/16 0452 04/01/16 0748  GLUCAP 87 112* 110* 103* 112*    Microbiology: Results for orders placed or performed during the hospital encounter of 03/25/16  Culture, blood (routine x 2)     Status: None   Collection Time: 03/25/16  7:19 AM  Result Value Ref Range Status   Specimen Description BLOOD LEFT FOREARM  Final   Special Requests BOTTLES DRAWN AEROBIC AND ANAEROBIC  1CC  Final   Culture NO GROWTH 5 DAYS  Final   Report Status 03/30/2016 FINAL  Final  Culture, blood (routine x 2)     Status: None   Collection Time: 03/25/16  7:19 AM  Result Value Ref Range Status   Specimen Description BLOOD LEFT HAND  Final   Special Requests BOTTLES DRAWN AEROBIC AND ANAEROBIC  1CC  Final    Culture NO GROWTH 5 DAYS  Final   Report Status 03/30/2016 FINAL  Final  MRSA PCR Screening     Status: Abnormal   Collection Time: 03/25/16 12:42 PM  Result Value Ref Range Status   MRSA by PCR POSITIVE (A) NEGATIVE Final    Comment:        The GeneXpert MRSA Assay (FDA approved for NASAL specimens only), is one component of a comprehensive MRSA colonization surveillance program. It is not intended to diagnose MRSA infection nor to guide or monitor treatment for MRSA infections. CRITICAL RESULT CALLED TO, READ BACK BY AND VERIFIED WITH:  AMELIA BERRY AT 1644 03/25/16 SDR   Urine culture     Status: Abnormal   Collection Time: 03/26/16  1:02 AM  Result Value Ref Range Status   Specimen Description URINE, RANDOM  Final   Special Requests NONE  Final   Culture (A)  Final    20,000 COLONIES/mL ESCHERICHIA COLI Results Called to: AMELIA BERRY, RN  AT 1055 ON 03/28/16 BY CTJ. ESBL-EXTENDED SPECTRUM BETA LACTAMASE-THE ORGANISM IS RESISTANT TO PENICILLINS, CEPHALOSPORINS AND AZTREONAM ACCORDING TO CLSI M100-S15 VOL.[place].    Report Status 03/28/2016 FINAL  Final   Organism ID, Bacteria ESCHERICHIA COLI (A)  Final      Susceptibility   Escherichia coli - MIC*    AMPICILLIN >=32 RESISTANT Resistant     CEFAZOLIN >=64 RESISTANT Resistant     CEFTRIAXONE >=64 RESISTANT Resistant     CIPROFLOXACIN >=4 RESISTANT Resistant     GENTAMICIN <=1 SENSITIVE Sensitive     IMIPENEM <=0.25 SENSITIVE Sensitive     NITROFURANTOIN <=16 SENSITIVE Sensitive     TRIMETH/SULFA >=320 RESISTANT Resistant     AMPICILLIN/SULBACTAM 16 INTERMEDIATE Intermediate     PIP/TAZO <=4 SENSITIVE Sensitive     Extended ESBL POSITIVE Resistant     * 20,000 COLONIES/mL ESCHERICHIA COLI  Culture, respiratory (tracheal aspirate)     Status: None (Preliminary result)   Collection Time: 03/30/16  8:50 AM  Result Value Ref Range Status   Specimen Description TRACHEAL ASPIRATE  Final   Special Requests NONE   Final   Gram Stain FEW WBC SEEN RARE GRAM NEGATIVE RODS   Final   Culture   Final    MODERATE GROWTH STAPHYLOCOCCUS AUREUS SUSCEPTIBILITIES TO FOLLOW    Report Status PENDING  Incomplete    Coagulation Studies: No results for input(s): LABPROT, INR in the last 72 hours.  Imaging: Dg Chest Port 1 View  04/01/2016  CLINICAL DATA:  Respiratory arrest, cardiopulmonary arrest with resuscitation, morbid obesity,  asthma, former smoker. EXAM: PORTABLE CHEST 1 VIEW COMPARISON:  Portable chest x-ray of March 28, 2016 FINDINGS: The lungs are well-expanded. The hemidiaphragms are excluded from the field of view however. The heart is normal in size. The central pulmonary vascularity is engorged. The pulmonary interstitial markings remain increased. The endotracheal tube tip lies 2.7 cm above the carina. The esophagogastric tube tip projects below the inferior margin of the image. The dialysis catheter tip projects below the inferior margin of the image is well. The right subclavian venous catheter tip projects over the proximal SVC. IMPRESSION: Limited study with exclusion of the hemidiaphragms. There is persistent pulmonary interstitial edema and mild cardiomegaly. There is no pneumothorax. The support tubes are in reasonable position where visualized. Electronically Signed   By: David  Martinique M.D.   On: 04/01/2016 10:13    Medications:  I have reviewed the patient's current medications. Scheduled: . antiseptic oral rinse  7 mL Mouth Rinse 10 times per day  . budesonide (PULMICORT) nebulizer solution  0.25 mg Nebulization 4 times per day  . chlorhexidine gluconate (SAGE KIT)  15 mL Mouth Rinse BID  . Chlorhexidine Gluconate Cloth  6 each Topical Q0600  . feeding supplement (PRO-STAT SUGAR FREE 64)  30 mL Per Tube QID  . fentaNYL (SUBLIMAZE) injection  50 mcg Intravenous Once  . insulin aspart  0-15 Units Subcutaneous 6 times per day  . ipratropium-albuterol  3 mL Nebulization Q6H  . lactulose  20 g  Oral BID  . levothyroxine  37.5 mcg Intravenous Daily  . meropenem (MERREM) IV  500 mg Intravenous Q24H  . mupirocin ointment  1 application Nasal BID  . pantoprazole (PROTONIX) IV  40 mg Intravenous QHS  . polyethylene glycol  17 g Oral BID  . senna-docusate  1 tablet Oral BID    Assessment/Plan: No significant change in neurological examination from 03/30/2016.  Spoke with family members.  Making decisions about dialysis today and about trach and PEG on Monday.     LOS: 7 days   Alexis Goodell, MD Neurology [phone] 04/01/2016  10:32 AM

## 2016-04-01 NOTE — Progress Notes (Signed)
Nutrition Follow-up  DOCUMENTATION CODES:   Morbid obesity  INTERVENTION:  -Recommend continuing current TF regimen at present; noted reglan discontinued yesterday, per discussion during ICU rounds, plan had been to start erythromycin as promotility agent instead of reglan. Erythromycin not ordered; discussed with Brittney RN   NUTRITION DIAGNOSIS:   Inadequate oral intake related to acute illness as evidenced by NPO status. Being addressed via TF  GOAL:   Provide needs based on ASPEN/SCCM guidelines  MONITOR:   Vent status, Labs, Weight trends, I & O's  REASON FOR ASSESSMENT:   Ventilator    ASSESSMENT:    Plan to start dialysis today with minimal UF, UOP 150 mL Patient is currently intubated on ventilator support, off sedation for >72 hours, unresponsive, neurology following  Pt tolerating Vital High Protein at rate of 40 ml/hr, Prostat QID  Diet Order:   NPO  Skin:  Reviewed, no issues  Last BM:  04/01/16   Labs: Creatinine 4.93  Meds: ss novolog, miralax, senokot  Height:   Ht Readings from Last 1 Encounters:  03/28/16 5\' 5"  (1.651 m)    Weight:   Wt Readings from Last 1 Encounters:  04/01/16 394 lb 13.5 oz (179.1 kg)    Ideal Body Weight:  56.8 kg  BMI:  Body mass index is 65.71 kg/(m^2).  Estimated Nutritional Needs:   Kcal:  1254-1425 kcals (22-25 kcals/kg IBW per ASPEN guidelines for BMI >50)  Protein:  143 g (2.5 g/kg)   Fluid:  >1.7 L  EDUCATION NEEDS:   Education needs no appropriate at this time  [place], [place], [place] [phone] Pager  [phone] Weekend/On-Call Pager

## 2016-04-02 ENCOUNTER — Inpatient Hospital Stay: Payer: Medicaid Other

## 2016-04-02 LAB — BASIC METABOLIC PANEL
Anion gap: 9 (ref 5–15)
BUN: 56 mg/dL — ABNORMAL HIGH (ref 6–20)
CALCIUM: 8.8 mg/dL — AB (ref 8.9–10.3)
CHLORIDE: 104 mmol/L (ref 101–111)
CO2: 27 mmol/L (ref 22–32)
CREATININE: 4.69 mg/dL — AB (ref 0.44–1.00)
GFR, EST AFRICAN AMERICAN: 11 mL/min — AB (ref >60)
GFR, EST NON AFRICAN AMERICAN: 9 mL/min — AB (ref >60)
Glucose, Bld: 118 mg/dL — ABNORMAL HIGH (ref 65–99)
Potassium: 4.1 mmol/L (ref 3.5–5.1)
SODIUM: 140 mmol/L (ref 135–145)

## 2016-04-02 LAB — GLUCOSE, CAPILLARY
GLUCOSE-CAPILLARY: 106 mg/dL — AB (ref 65–99)
GLUCOSE-CAPILLARY: 107 mg/dL — AB (ref 65–99)
GLUCOSE-CAPILLARY: 108 mg/dL — AB (ref 65–99)
GLUCOSE-CAPILLARY: 122 mg/dL — AB (ref 65–99)
Glucose-Capillary: 115 mg/dL — ABNORMAL HIGH (ref 65–99)
Glucose-Capillary: 117 mg/dL — ABNORMAL HIGH (ref 65–99)

## 2016-04-02 LAB — CBC
HEMATOCRIT: 25.1 % — AB (ref 35.0–47.0)
Hemoglobin: 7.7 g/dL — ABNORMAL LOW (ref 12.0–16.0)
MCH: 22.7 pg — ABNORMAL LOW (ref 26.0–34.0)
MCHC: 30.7 g/dL — ABNORMAL LOW (ref 32.0–36.0)
MCV: 73.8 fL — ABNORMAL LOW (ref 80.0–100.0)
Platelets: 161 10*3/uL (ref 150–440)
RBC: 3.4 MIL/uL — ABNORMAL LOW (ref 3.80–5.20)
RDW: 19.7 % — AB (ref 11.5–14.5)
WBC: 8.8 10*3/uL (ref 3.6–11.0)

## 2016-04-02 LAB — HEPATITIS B CORE ANTIBODY, TOTAL: HEP B C TOTAL AB: NEGATIVE

## 2016-04-02 LAB — HEPATITIS B SURFACE ANTIGEN: Hepatitis B Surface Ag: NEGATIVE

## 2016-04-02 LAB — HEPATITIS C ANTIBODY: HCV AB: 0.1 {s_co_ratio} (ref 0.0–0.9)

## 2016-04-02 LAB — HEPATITIS B SURFACE ANTIBODY,QUALITATIVE: HEP B S AB: NONREACTIVE

## 2016-04-02 LAB — HEPARIN LEVEL (UNFRACTIONATED): Heparin Unfractionated: 0.49 IU/mL (ref 0.30–0.70)

## 2016-04-02 LAB — HEPATITIS B SURFACE ANTIBODY, QUANTITATIVE: Hep B S AB Quant (Post): <3.1 m[IU]/mL — ABNORMAL LOW (ref >9.9)

## 2016-04-02 NOTE — Progress Notes (Signed)
Pharmacy Antibiotic Note  Tasha Long is a 60 y.o. female admitted on 03/25/2016 with respiratory distress ordered empiric abx and ESBL Ecoli.  Pharmacy has been consulted for vancomycin and Merrem   Plan: Per Nephrology note 4/21, plan to start HD. However it appears that the patient is pending transfer to [facility] and the family prefers to defer dialysis until then. Will continue to follow and adjust if HD initiated. Otherwise will continue with current plan:  Repeat vancomycin level 27  mcg/mL. Continue to hold vancomycin. Will recheck random level with am labs on Sunday. Renal function continues to worsen in this patient.   Will continue  Merrem 500 mg IV q 24 hours since patient is in AKI and serum creatinine continues to increase. Dosed as if patient's CrCl <10 ml/min since Crcl can't accurately estimated renal function in AKI.   Height: 5\' 5"  (165.1 cm) Weight: (!) 394 lb 13.5 oz (179.1 kg) IBW/kg (Calculated) : 57  Temp (24hrs), Avg:98.1 F (36.7 C), Min:97.3 F (36.3 C), Max:98.8 F (37.1 C)   Recent Labs Lab 03/27/16 0352 03/27/16 1626  03/29/16 0552 03/29/16 1508 03/30/16 0401 03/31/16 0520 03/31/16 0526 04/01/16 0442 04/01/16 0805 04/02/16 0425  WBC 7.0  --   < > 5.4  --  6.5  --  9.1 6.9  --  8.8  CREATININE 2.23*  --   < > 3.46*  --  4.12* 4.57*  --  4.93*  --  4.69*  VANCOTROUGH 55* 46*  --   --   --   --   --   --   --   --   --   VANCORANDOM  --   --   < >  --  37  --   --   --   --  27  --   < > = values in this interval not displayed.  Estimated Creatinine Clearance: 21.8 mL/min (by C-G formula based on Cr of 4.69).    Allergies  Allergen Reactions  . Codeine Hives  . Sulfa Antibiotics Hives    Antimicrobials this admission: Zosyn 4/14 >> 4/14 vancomycin 4/14 >>  Cefepime 4/14 >> 4/17 Metronidazole 4/14 >> 4/17 Meropenem 4/17 >>  Dose adjustments this admission: Merrem 500 mg IV q24 hours 4/18   Microbiology results: 4/19 TA:  MRSA 4/14 BCx: negative UCx: 20k ESBL E coli MRSA PCR: positive  Thank you for allowing pharmacy to be a part of this patient's care.  Algis Lehenbauer C, Pharm.D. Clinical Pharmacist 04/02/2016 9:51 AM

## 2016-04-02 NOTE — Progress Notes (Signed)
Pt had an uneventful night. CVP up in the 20s this shift. Has not been responsive to much stimuli. Changed to sizeform specialty bed. Family members visited and updated.

## 2016-04-02 NOTE — Progress Notes (Signed)
[facility]  ROUNDING NOTE   Subjective:   Family at bedside.    Objective:  Vital signs in last 24 hours:  Temp:  [97.3 F (36.3 C)-98.8 F (37.1 C)] 98.6 F (37 C) (04/22 1100) Pulse Rate:  [95-109] 100 (04/22 1100) Resp:  [17-30] 28 (04/22 1100) BP: (121-155)/(42-63) 130/42 mmHg (04/22 1100) SpO2:  [95 %-100 %] 95 % (04/22 1100) FiO2 (%):  [35 %] 35 % (04/22 1143)  Weight change:  Filed Weights   03/30/16 0438 03/31/16 0500 04/01/16 0641  Weight: 177.3 kg (390 lb 14 oz) 179.5 kg (395 lb 11.6 oz) 179.1 kg (394 lb 13.5 oz)    Intake/Output: I/O last 3 completed shifts: In: 2470 [I.V.:740; NG/GT:1680; IV Piggyback:50] Out: 250 [Urine:250]   Intake/Output this shift:  Total I/O In: 20 [I.V.:20] Out: 100 [Urine:100]  Physical Exam: General: Critically ill  Head: ETT OGT  Eyes: Eyes closed  Neck: trachea midline  Lungs:  Coarse breath sounds, PRVC FiO2 35%  Heart: Regular rate and rhythm  Abdomen:  Soft, nontender, obese, no bowel sounds  Extremities: nonpitting peripheral edema.  Neurologic: Sedated, intubated  Skin: Erythema bilateral lower extremities  Access: vascath 4/17 Dr. Mortimer Fries    Basic Metabolic Panel:  Recent Labs Lab 03/27/16 0352  03/29/16 9326 03/30/16 0401 03/31/16 0520 04/01/16 0442 04/02/16 0425  NA 134*  < > 135 136 138 140 140  K 4.1  < > 4.2 4.4 4.6 4.3 4.1  CL 101  < > 105 105 106 107 104  CO2 27  < > 24 22 23 26 27   GLUCOSE 90  < > 83 77 104* 117* 118*  BUN 24*  < > 32* 34* 44* 53* 56*  CREATININE 2.23*  < > 3.46* 4.12* 4.57* 4.93* 4.69*  CALCIUM 7.9*  < > 7.9* 8.1* 9.0 8.8* 8.8*  MG 2.1  --   --   --   --   --   --   PHOS 5.5*  --  5.4* 5.6*  --   --   --   < > = values in this interval not displayed.  Liver Function Tests:  Recent Labs Lab 03/28/16 0349 03/29/16 0552 03/30/16 0401 04/01/16 0442  AST 29  --   --  17  ALT 11*  --   --  9*  ALKPHOS 30*  --   --  28*  BILITOT 1.0  --   --  0.5  PROT 6.3*   --   --  6.6  ALBUMIN 2.4* 2.2* 2.1* 2.3*   No results for input(s): LIPASE, AMYLASE in the last 168 hours. No results for input(s): AMMONIA in the last 168 hours.  CBC:  Recent Labs Lab 03/29/16 0552 03/30/16 0401 03/31/16 0526 04/01/16 0442 04/02/16 0425  WBC 5.4 6.5 9.1 6.9 8.8  HGB 7.7* 7.6* 8.5* 7.8* 7.7*  HCT 24.8* 24.4* 27.4* 25.8* 25.1*  MCV 73.6* 75.2* 74.2* 75.8* 73.8*  PLT 184 190 191 178 161    Cardiac Enzymes: No results for input(s): CKTOTAL, CKMB, CKMBINDEX, TROPONINI in the last 168 hours.  BNP: Invalid input(s): POCBNP  CBG:  Recent Labs Lab 04/01/16 2013 04/02/16 0017 04/02/16 0418 04/02/16 0711 04/02/16 1108  GLUCAP 115* 122* 115* 117* 107*    Microbiology: Results for orders placed or performed during the hospital encounter of 03/25/16  Culture, blood (routine x 2)     Status: None   Collection Time: 03/25/16  7:19 AM  Result Value Ref Range  Status   Specimen Description BLOOD LEFT FOREARM  Final   Special Requests BOTTLES DRAWN AEROBIC AND ANAEROBIC  1CC  Final   Culture NO GROWTH 5 DAYS  Final   Report Status 03/30/2016 FINAL  Final  Culture, blood (routine x 2)     Status: None   Collection Time: 03/25/16  7:19 AM  Result Value Ref Range Status   Specimen Description BLOOD LEFT HAND  Final   Special Requests BOTTLES DRAWN AEROBIC AND ANAEROBIC  1CC  Final   Culture NO GROWTH 5 DAYS  Final   Report Status 03/30/2016 FINAL  Final  MRSA PCR Screening     Status: Abnormal   Collection Time: 03/25/16 12:42 PM  Result Value Ref Range Status   MRSA by PCR POSITIVE (A) NEGATIVE Final    Comment:        The GeneXpert MRSA Assay (FDA approved for NASAL specimens only), is one component of a comprehensive MRSA colonization surveillance program. It is not intended to diagnose MRSA infection nor to guide or monitor treatment for MRSA infections. CRITICAL RESULT CALLED TO, READ BACK BY AND VERIFIED WITH:  Tasha Long AT 1644 03/25/16  SDR   Urine culture     Status: Abnormal   Collection Time: 03/26/16  1:02 AM  Result Value Ref Range Status   Specimen Description URINE, RANDOM  Final   Special Requests NONE  Final   Culture (A)  Final    20,000 COLONIES/mL ESCHERICHIA COLI Results Called to: Tasha BERRY, RN  AT 1055 ON 03/28/16 BY CTJ. ESBL-EXTENDED SPECTRUM BETA LACTAMASE-THE ORGANISM IS RESISTANT TO PENICILLINS, CEPHALOSPORINS AND AZTREONAM ACCORDING TO CLSI M100-S15 VOL.[place].    Report Status 03/28/2016 FINAL  Final   Organism ID, Bacteria ESCHERICHIA COLI (A)  Final      Susceptibility   Escherichia coli - MIC*    AMPICILLIN >=32 RESISTANT Resistant     CEFAZOLIN >=64 RESISTANT Resistant     CEFTRIAXONE >=64 RESISTANT Resistant     CIPROFLOXACIN >=4 RESISTANT Resistant     GENTAMICIN <=1 SENSITIVE Sensitive     IMIPENEM <=0.25 SENSITIVE Sensitive     NITROFURANTOIN <=16 SENSITIVE Sensitive     TRIMETH/SULFA >=320 RESISTANT Resistant     AMPICILLIN/SULBACTAM 16 INTERMEDIATE Intermediate     PIP/TAZO <=4 SENSITIVE Sensitive     Extended ESBL POSITIVE Resistant     * 20,000 COLONIES/mL ESCHERICHIA COLI  Culture, respiratory (tracheal aspirate)     Status: None (Preliminary result)   Collection Time: 03/30/16  8:50 AM  Result Value Ref Range Status   Specimen Description TRACHEAL ASPIRATE  Final   Special Requests NONE  Final   Gram Stain FEW WBC SEEN RARE GRAM NEGATIVE RODS   Final   Culture   Final    MODERATE GROWTH METHICILLIN RESISTANT STAPHYLOCOCCUS AUREUS TRYING TO ISOLATE OTHER POSSIBLE PATHOGENS    Report Status PENDING  Incomplete   Organism ID, Bacteria METHICILLIN RESISTANT STAPHYLOCOCCUS AUREUS  Final      Susceptibility   Methicillin resistant staphylococcus aureus - MIC*    CIPROFLOXACIN <=0.5 SENSITIVE Sensitive     ERYTHROMYCIN >=8 RESISTANT Resistant     GENTAMICIN <=0.5 SENSITIVE Sensitive     OXACILLIN >=4 RESISTANT Resistant     TETRACYCLINE 2 SENSITIVE Sensitive      VANCOMYCIN 1 SENSITIVE Sensitive     TRIMETH/SULFA >=320 RESISTANT Resistant     CLINDAMYCIN >=8 RESISTANT Resistant     RIFAMPIN <=0.5 SENSITIVE Sensitive  Inducible Clindamycin NEGATIVE Sensitive     * MODERATE GROWTH METHICILLIN RESISTANT STAPHYLOCOCCUS AUREUS    Coagulation Studies: No results for input(s): LABPROT, INR in the last 72 hours.  Urinalysis: No results for input(s): COLORURINE, LABSPEC, PHURINE, GLUCOSEU, HGBUR, BILIRUBINUR, KETONESUR, PROTEINUR, UROBILINOGEN, NITRITE, LEUKOCYTESUR in the last 72 hours.  Invalid input(s): APPERANCEUR    Imaging: Dg Chest Port 1 View  04/02/2016  CLINICAL DATA:  Acute respiratory failure. EXAM: PORTABLE CHEST 1 VIEW COMPARISON:  04/01/2016 FINDINGS: Lungs mildly hyperexpanded. There are prominent interstitial markings, stable from previous day's exam. No convincing pneumonia or pulmonary edema. No pleural effusion or pneumothorax. Cardiac silhouette is normal in size. Endotracheal tube tip projects 2 cm above the Carina, well positioned. Oral/nasogastric tube passes below the diaphragm into the stomach. Left internal jugular dual-lumen tunneled central venous catheter tip projects in the right atrium. Right subclavian central venous line catheter tip projects in the mid superior vena cava. No change in support apparatus. IMPRESSION: 1. Prominent interstitial markings at lung hyperexpansion. Overall, lung aeration has continuing improved since studies dated 03/25/2016. No new lung abnormalities. 2. Support apparatus is stable as described. Electronically Signed   By: Lajean Manes M.D.   On: 04/02/2016 07:51   Dg Chest Port 1 View  04/01/2016  CLINICAL DATA:  Respiratory arrest, cardiopulmonary arrest with resuscitation, morbid obesity, asthma, former smoker. EXAM: PORTABLE CHEST 1 VIEW COMPARISON:  Portable chest x-ray of March 28, 2016 FINDINGS: The lungs are well-expanded. The hemidiaphragms are excluded from the field of view however. The  heart is normal in size. The central pulmonary vascularity is engorged. The pulmonary interstitial markings remain increased. The endotracheal tube tip lies 2.7 cm above the carina. The esophagogastric tube tip projects below the inferior margin of the image. The dialysis catheter tip projects below the inferior margin of the image is well. The right subclavian venous catheter tip projects over the proximal SVC. IMPRESSION: Limited study with exclusion of the hemidiaphragms. There is persistent pulmonary interstitial edema and mild cardiomegaly. There is no pneumothorax. The support tubes are in reasonable position where visualized. Electronically Signed   By: David  Martinique M.D.   On: 04/01/2016 10:13     Medications:   . feeding supplement (VITAL HIGH PROTEIN) 1,000 mL (04/02/16 0800)  . fentaNYL infusion INTRAVENOUS Stopped (03/29/16 0837)  . heparin 2,000 Units/hr (04/02/16 1155)  . midazolam (VERSED) infusion Stopped (03/29/16 0837)  . norepinephrine Stopped (03/28/16 2331)   . antiseptic oral rinse  7 mL Mouth Rinse 10 times per day  . budesonide (PULMICORT) nebulizer solution  0.25 mg Nebulization 4 times per day  . chlorhexidine gluconate (SAGE KIT)  15 mL Mouth Rinse BID  . Chlorhexidine Gluconate Cloth  6 each Topical Q0600  . feeding supplement (PRO-STAT SUGAR FREE 64)  30 mL Per Tube QID  . fentaNYL (SUBLIMAZE) injection  50 mcg Intravenous Once  . insulin aspart  0-15 Units Subcutaneous 6 times per day  . ipratropium-albuterol  3 mL Nebulization Q6H  . levothyroxine  37.5 mcg Intravenous Daily  . meropenem (MERREM) IV  500 mg Intravenous Q24H  . mupirocin ointment  1 application Nasal BID  . pantoprazole (PROTONIX) IV  40 mg Intravenous QHS  . polyethylene glycol  17 g Oral BID  . senna-docusate  1 tablet Oral BID   sodium chloride, acetaminophen, albuterol, fentaNYL, midazolam, ondansetron (ZOFRAN) IV, sodium chloride flush  Assessment/ Plan:  Ms. Tasha Long  is a 60 y.o. white female with chronic respiratory  failure due to COPD, morbid obesity, obstructive sleep apnea requiring CPAP, chronic severe bilateral lymphedema in the legs, bipolar disorder, anxiety, IBS, GERD, hypertension, hyperlipidemia, hypothyroidism, chronic stasis dermatiti , was admitted on 03/25/2016 with Acute respiratory distress.   1. Acute renal failure on chronic kidney disease stage III with baseline creatinine of 1.2, eGFR of 49: anuric. Secondary to ATN. With elevated vancomycin levels.  Overall prognosis poor. Discussed with patient's family,  - hemodialysis for later today. Orders prepared  3. Acute respiratory failure: intubated on mechanical ventilation. with multilobar pneumonia.  - meropenem. Continue supportive care  3. Sepsis/Hypotension secondary to ESBL E. Coli Urinary tract infection: no longer on vasopressors.   4. Anemia:  - low threshold for epo and/or PRBC transfusion.    LOS: [place], [place] 4/22/201712:00 PM

## 2016-04-02 NOTE — Progress Notes (Signed)
PULMONARY / CRITICAL CARE MEDICINE   Name: Tasha Long MRN: [id number] DOB: May 13, 1956    ADMISSION DATE:  03/25/2016  BRIEF HISTORY: 60 year old female in chronically poor health, admitted to the ICU after suffering a brief cardiopulmonary arrest, PEA and 03/25/2016. Patient has past medical history significant for hypertension, hyperlipidemia, COPD, morbid obesity, hypothyroidism. Since admission to the ICU for active problems are: Acute on chronic renal failure Acute respiratory failure requiring mechanical ventilation with multilobar pneumonia ESBL Escherichia coli UTI Anemia Neurologic depressed state  SUBJECTIVE:  No acute issues overnight and no significant neurologic improvement. Off CRRT and now on intermittent HD. Remains on full vent support Remains intubated,sedated for increased WOB  MAJOR EVENTS/TEST RESULTS: 04/14 TTE: 0000000, G1 diastrolic dysfcn, mild LA dil, mod RV dil, mild RA Dil. 4 chamber dilation, with mod sys dysfcn and mild diastolic dysfcn w/diffuse hypokinesis 04/14 LE venous US: negative.  03/25/16; abdomen US: negative, but incomplete exam.    INDWELLING DEVICES:: ETT 04/14 >>  R [place] CVL 04/14 >>   MICRO DATA: Urine 04/14 >> Positive for E- Coli Resp 04/14 >> pending.  Blood 04/14 >> negative.  MRSA PCR positive.   PCT algorithm 04/14: 1.48  ANTIMICROBIALS:  Vanc 04/14 >>  Metronidazole 04/14 >>  Cefepime 04/14 >>  Meropenem 4/19>>  VITAL SIGNS: Temp:  [97.3 F (36.3 C)-98.8 F (37.1 C)] 98.6 F (37 C) (04/22 0600) Pulse Rate:  [90-109] 100 (04/22 0600) Resp:  [17-30] 29 (04/22 0600) BP: (123-155)/(43-63) 126/43 mmHg (04/22 0600) SpO2:  [95 %-100 %] 97 % (04/22 0600) FiO2 (%):  [35 %] 35 % (04/22 0600) HEMODYNAMICS: CVP:  [16 mmHg-32 mmHg] 21 mmHg VENTILATOR SETTINGS: Vent Mode:  [-] PRVC FiO2 (%):  [35 %] 35 % Set Rate:  [26 bmp-28 bmp] 28 bmp Vt Set:  [500 mL] 500 mL PEEP:  [5 cmH20] 5 cmH20 INTAKE /  OUTPUT:  Intake/Output Summary (Last 24 hours) at 04/02/16 [id number] Last data filed at 04/02/16 0600  Gross per 24 hour  Intake   1580 ml  Output    175 ml  Net   1405 ml    Review of Systems  Unable to perform ROS: intubated    Physical Exam General Appearance: Acutely ill  appearing white female Neuro:Atraumatic, normocephalic,Grimaces to pain HEENT: Pupils sluggishly reacting to light,no JVD appreciated, no discharge noted. Pulmonary: Clear bilaterally, no weezes, crackles, rhonchi noted  Cardiovascular Normal S1,S2. NoMRG, RRR  Abdomen Obese, round, hypoactive bowel sounds Renal: No costovertebral tenderness  GU: not performed Endocrine: No evident thyromegaly. Skin: warm,, no ecchymosis ,cellulitis present , red Extremities: +3 pitting edema, warm to touch, red GCs<8T  LABS:  CBC  Recent Labs Lab 03/31/16 0526 04/01/16 0442 04/02/16 0425  WBC 9.1 6.9 8.8  HGB 8.5* 7.8* 7.7*  HCT 27.4* 25.8* 25.1*  PLT 191 178 161   Coag's No results for input(s): APTT, INR in the last 168 hours. BMET  Recent Labs Lab 03/31/16 0520 04/01/16 0442 04/02/16 0425  NA 138 140 140  K 4.6 4.3 4.1  CL 106 107 104  CO2 23 26 27   BUN 44* 53* 56*  CREATININE 4.57* 4.93* 4.69*  GLUCOSE 104* 117* 118*   Electrolytes  Recent Labs Lab 03/27/16 0352  03/29/16 0552 03/30/16 0401 03/31/16 0520 04/01/16 0442 04/02/16 0425  CALCIUM 7.9*  < > 7.9* 8.1* 9.0 8.8* 8.8*  MG 2.1  --   --   --   --   --   --  PHOS 5.5*  --  5.4* 5.6*  --   --   --   < > = values in this interval not displayed. Sepsis Markers  Recent Labs Lab 03/27/16 0352  PROCALCITON 1.59   ABG  Recent Labs Lab 03/27/16 1200 03/28/16 0500 04/01/16 0900  PHART 7.25* 7.40 7.29*  PCO2ART 59* 40 53*  PO2ART 69* 93 80*   Liver Enzymes  Recent Labs Lab 03/28/16 0349 03/29/16 0552 03/30/16 0401 04/01/16 0442  AST 29  --   --  17  ALT 11*  --   --  9*  ALKPHOS 30*  --   --  28*  BILITOT 1.0   --   --  0.5  ALBUMIN 2.4* 2.2* 2.1* 2.3*   Cardiac Enzymes No results for input(s): TROPONINI, PROBNP in the last 168 hours. Glucose  Recent Labs Lab 04/01/16 1148 04/01/16 1607 04/01/16 2013 04/02/16 0017 04/02/16 0418 04/02/16 0711  GLUCAP 129* 117* 115* 122* 115* 117*    Imaging Dg Chest Port 1 View  04/01/2016  CLINICAL DATA:  Respiratory arrest, cardiopulmonary arrest with resuscitation, morbid obesity, asthma, former smoker. EXAM: PORTABLE CHEST 1 VIEW COMPARISON:  Portable chest x-ray of March 28, 2016 FINDINGS: The lungs are well-expanded. The hemidiaphragms are excluded from the field of view however. The heart is normal in size. The central pulmonary vascularity is engorged. The pulmonary interstitial markings remain increased. The endotracheal tube tip lies 2.7 cm above the carina. The esophagogastric tube tip projects below the inferior margin of the image. The dialysis catheter tip projects below the inferior margin of the image is well. The right subclavian venous catheter tip projects over the proximal SVC. IMPRESSION: Limited study with exclusion of the hemidiaphragms. There is persistent pulmonary interstitial edema and mild cardiomegaly. There is no pneumothorax. The support tubes are in reasonable position where visualized. Electronically Signed   By: David  Martinique M.D.   On: 04/01/2016 10:13    ASSESSMENT / PLAN: 18 F in chronically very poor health developed abdominal pain and EMS was dispatched. Suffered respiratory arrest and brief cardiac arrest in the transfer from her bed to wheelchair and into ambulance. Intubated and transported to ED. Suffered brief PEA arrest again in ICU shortly after arrival   PULMONARY A: Respiratory arrest COPD OSA Smoker Concern for PE given numerous RFs - no CTA given AKI.  Respiratory acidosis P:  Continue anticoagulation empirically.  Continue vent support, not weaning candidate at this time.  Continue Albuterol,  pulmicort Nebs.  Vent adjustment, wean, per protocol  CXR prn and Labs prn  CARDIOVASCULAR A:  Cardiac arrest due to respiratory arrest Hypertension Hyperlipidemia P:  Echo indicative of Four-chamber dilated patient, with moderate left ventricle systolic dysfunction and mild diastolic dysfunction with diffuse hypokinesis  MAP goal > 65 mmHg  RENAL A:  AKI, increased creatinine today.  Hyperkalemia Very poor candidate for HD P:  Monitor I/O, monitor kidney function.  Continue IVF. Nephrology following the patient  CRRT Per nephrology - starting 4/21  GASTROINTESTINAL A:  Morbid obesity Abdominal pain P:  SUP: IV PPI TF protocol  . HEMATOLOGIC A:  Anemia with microcytosis - no overt bleeding P:  DVT px: full dose heparin heparin Monitor CBC intermittently Transfuse per usual guidelines  INFECTIOUS A:  Possible abdominal sepsis Possible PNA Possible LE cellulitis with prior hx of MRSA cellulitis ESBL in the urine P:  Monitor temp, WBC count Micro and abx as above Continue vanc and meropenem for now  ENDOCRINE A:  Suspect insulin resistance though no history of DM Glucose controlled.  Hypothyroidism P:  IV L-thyroxine. Transition to enteral when able SSI - mod scale  NEUROLOGIC A:  Post arrest anoxic encephalopathy ICU associated discomfort P:  RASS goal: -2, -3 PAD protocol Daily W[facility] CT head was negative for any acute abnormality  Disposition and family update: No family at bedside today. Awaiting bed availability at transfer facility  Best Practice: Code Status:  Full. Diet: HPO GI prophylaxis: PP   Total time =35 minutes  [facility] ANP-BC Pulmonary and [place] Pager [phone] 04/02/2016 @ [phone]  STAFF NOTE: I, Dr. Corrin Parker,  have personally reviewed patient's available data, including medical history, events of note, physical examination and  test results as part of my evaluation. I have discussed with NP and other care providers such as pharmacist, RN and RRT.  In addition,  I personally evaluated patient and elicited key findings  Agitated, increased WOB on vent, slight wheezes and rhonchi   A:acute cardiac arrest and resp failure  P:cont vent support, continue HD as needed I recommend Trach and PEG tube, will need to update family      The Rest per NP whose note is outlined above and that I agree with  I have personally reviewed/obtained a history, examined the patient, evaluated Pertinent laboratory and RadioGraphic/imaging results, and  formulated the assessment and plan   The Patient requires high complexity decision making for assessment and support, frequent evaluation and titration of therapies, application of advanced monitoring technologies and extensive interpretation of multiple databases. Critical Care Time devoted to patient care services described in this note is 35 minutes.   This Critical care time does not reflrect procedure time or supervisory time of NP but could involve care discussion time Overall, patient is critically ill, prognosis is guarded.  Patient with Multiorgan failure and at high risk for cardiac arrest and death.   Patient awaiting transfer to [facility].   Corrin Parker, M.D.  [facility]  Medical Director [place] Director [facility] Cardio-Pulmonary Department

## 2016-04-02 NOTE — Progress Notes (Signed)
Lorriane Shire of [facility] transport called for update on patient, so she can give information to providers at [facility]. Pt is on the ICU waiting list.

## 2016-04-02 NOTE — Progress Notes (Signed)
ANTICOAGULATION CONSULT NOTE - Initial Consult  Pharmacy Consult for Heparin Indication: pulmonary embolus  Allergies  Allergen Reactions  . Codeine Hives  . Sulfa Antibiotics Hives   Patient Measurements: Height: _0  (165.1 cm) Weight: (!) 394 lb 13.5 oz (179.1 kg) IBW/kg (Calculated) : 57 Heparin Dosing Weight: 93.6 kg  Vital Signs: Temp: 98.8 F (37.1 C) (04/22 0400) BP: 123/61 mmHg (04/22 0400) Pulse Rate: 106 (04/22 0400)  Labs:  Recent Labs  03/31/16 0520  03/31/16 0526 03/31/16 0626 04/01/16 0442 04/02/16 0425  HGB  --   < > 8.5*  --  7.8* 7.7*  HCT  --   --  27.4*  --  25.8* 25.1*  PLT  --   --  191  --  178 161  HEPARINUNFRC  --   --   --  0.49 0.50 0.49  CREATININE 4.57*  --   --   --  4.93* 4.69*  < > = values in this interval not displayed.  Estimated Creatinine Clearance: 21.8 mL/min (by C-G formula based on Cr of 4.69).   Medical History: History reviewed. No pertinent past medical history.  Medications:  Scheduled:  . antiseptic oral rinse  7 mL Mouth Rinse 10 times per day  . budesonide (PULMICORT) nebulizer solution  0.25 mg Nebulization 4 times per day  . chlorhexidine gluconate (SAGE KIT)  15 mL Mouth Rinse BID  . Chlorhexidine Gluconate Cloth  6 each Topical Q0600  . feeding supplement (PRO-STAT SUGAR FREE 64)  30 mL Per Tube QID  . fentaNYL (SUBLIMAZE) injection  50 mcg Intravenous Once  . insulin aspart  0-15 Units Subcutaneous 6 times per day  . ipratropium-albuterol  3 mL Nebulization Q6H  . levothyroxine  37.5 mcg Intravenous Daily  . meropenem (MERREM) IV  500 mg Intravenous Q24H  . mupirocin ointment  1 application Nasal BID  . pantoprazole (PROTONIX) IV  40 mg Intravenous QHS  . polyethylene glycol  17 g Oral BID  . senna-docusate  1 tablet Oral BID   Infusions:  . feeding supplement (VITAL HIGH PROTEIN) 1,000 mL (04/01/16 0800)  . fentaNYL infusion INTRAVENOUS Stopped (03/29/16 0837)  . heparin 2,000 Units/hr (04/01/16  2126)  . midazolam (VERSED) infusion Stopped (03/29/16 0837)  . norepinephrine Stopped (03/28/16 2331)    Assessment: 60 y/o F admitted with respiratory arrest ordered heparin for possible PE.   Goal of Therapy:  Heparin level 0.3-0.7 units/ml Monitor platelets by anticoagulation protocol: Yes   Plan:  Heparin level subtherapeutic. 1500 unit IV x 1 bolus and increase rate to 2000 units/hr. Will recheck level in 6 hours.  4/17 at 21:52  HL = 0.56. Will continue current dosing.  Will recheck HL with AM labs.   4/18 AM heparin level 0.46. Continue current regimen. Recheck with CBC tomorrow AM.  4/19 AM heparin level 0.37. Continue current regimen. Recheck with CBC tomorrow AM.   4/20 AM heparin level 0.49. Continue current regimen. Recheck with CBC tomorrow AM.   4/21 AM heparin level 0.50. Continue current regimen. Recheck with CBC tomorrow AM.   4/22 AM heparin level 0.49. Continue current regimen. Recheck with CBC tomorrow AM.   Eloise Harman, RPh Clinical Pharmacist 04/02/2016,5:17 AM

## 2016-04-02 NOTE — Progress Notes (Signed)
This note also relates to the following rows which could not be included: Temp - Cannot attach notes to unvalidated device data Pulse Rate - Cannot attach notes to unvalidated device data Resp - Cannot attach notes to unvalidated device data BP - Cannot attach notes to unvalidated device data   PRE TX

## 2016-04-03 ENCOUNTER — Inpatient Hospital Stay: Payer: Medicaid Other

## 2016-04-03 LAB — CBC
HEMATOCRIT: 22.4 % — AB (ref 35.0–47.0)
HEMOGLOBIN: 7.2 g/dL — AB (ref 12.0–16.0)
MCH: 23.6 pg — AB (ref 26.0–34.0)
MCHC: 32.1 g/dL (ref 32.0–36.0)
MCV: 73.6 fL — ABNORMAL LOW (ref 80.0–100.0)
Platelets: 139 10*3/uL — ABNORMAL LOW (ref 150–440)
RBC: 3.04 MIL/uL — ABNORMAL LOW (ref 3.80–5.20)
RDW: 19 % — AB (ref 11.5–14.5)
WBC: 8.2 10*3/uL (ref 3.6–11.0)

## 2016-04-03 LAB — VANCOMYCIN, RANDOM: Vancomycin Rm: 21 ug/mL

## 2016-04-03 LAB — GLUCOSE, CAPILLARY
GLUCOSE-CAPILLARY: 109 mg/dL — AB (ref 65–99)
GLUCOSE-CAPILLARY: 96 mg/dL (ref 65–99)
GLUCOSE-CAPILLARY: 98 mg/dL (ref 65–99)
Glucose-Capillary: 96 mg/dL (ref 65–99)
Glucose-Capillary: 110 mg/dL — ABNORMAL HIGH (ref 65–99)
Glucose-Capillary: 104 mg/dL — ABNORMAL HIGH (ref 65–99)
Glucose-Capillary: 99 mg/dL (ref 65–99)

## 2016-04-03 LAB — HEPARIN LEVEL (UNFRACTIONATED): Heparin Unfractionated: 0.44 IU/mL (ref 0.30–0.70)

## 2016-04-03 MED ORDER — SODIUM CHLORIDE 0.9 % IV SOLN
500.0000 mg | INTRAVENOUS | Status: DC
  Administered 2016-04-03 – 2016-04-08 (×6): 500 mg via INTRAVENOUS
  Filled 2016-04-03 (×7): qty 0.5

## 2016-04-03 NOTE — Progress Notes (Signed)
PULMONARY / CRITICAL CARE MEDICINE   Name: Tasha Long MRN: [id number] DOB: 1956-10-13    ADMISSION DATE:  03/25/2016  BRIEF HISTORY: 60 year old female in chronically poor health, admitted to the ICU after suffering a brief cardiopulmonary arrest, PEA and 03/25/2016. Patient has past medical history significant for hypertension, hyperlipidemia, COPD, morbid obesity, hypothyroidism. Since admission to the ICU for active problems are: Acute on chronic renal failure Acute respiratory failure requiring mechanical ventilation with multilobar pneumonia ESBL Escherichia coli UTI Anemia Neurologic depressed state  SUBJECTIVE:  No acute issues overnight and no significant neurologic improvement. Off CRRT and now on intermittent HD. Remains on full vent support Remains intubated,sedated for increased WOB Will increased TV  MAJOR EVENTS/TEST RESULTS: 04/14 TTE: 0000000, G1 diastrolic dysfcn, mild LA dil, mod RV dil, mild RA Dil. 4 chamber dilation, with mod sys dysfcn and mild diastolic dysfcn w/diffuse hypokinesis 04/14 LE venous US: negative.  03/25/16; abdomen US: negative, but incomplete exam.    INDWELLING DEVICES:: ETT 04/14 >>  R [place] CVL 04/14 >>   MICRO DATA: Urine 04/14 >> Positive for E- Coli Resp 04/14 >> pending.  Blood 04/14 >> negative.  MRSA PCR positive.   PCT algorithm 04/14: 1.48  ANTIMICROBIALS:  Vanc 04/14 >>  Metronidazole 04/14 >>  Cefepime 04/14 >>  Meropenem 4/19>>  VITAL SIGNS: Temp:  [95.2 F (35.1 C)-98.6 F (37 C)] 98.4 F (36.9 C) (04/23 0400) Pulse Rate:  [86-107] 103 (04/23 1000) Resp:  [23-33] 28 (04/23 1000) BP: (120-157)/(42-62) 129/55 mmHg (04/23 1000) SpO2:  [95 %-100 %] 96 % (04/23 1000) FiO2 (%):  [35 %] 35 % (04/23 0900) HEMODYNAMICS: CVP:  [15 mmHg-27 mmHg] 23 mmHg VENTILATOR SETTINGS: Vent Mode:  [-] PRVC FiO2 (%):  [35 %] 35 % Set Rate:  [28 bmp] 28 bmp Vt Set:  [500 mL] 500 mL PEEP:  [5 cmH20] 5  cmH20 Plateau Pressure:  [23 cmH20-26 cmH20] 26 cmH20 INTAKE / OUTPUT:  Intake/Output Summary (Last 24 hours) at 04/03/16 1050 Last data filed at 04/03/16 1000  Gross per 24 hour  Intake   1640 ml  Output   2300 ml  Net   -660 ml    Review of Systems  Unable to perform ROS: intubated    Physical Exam General Appearance: Acutely ill  appearing white female Neuro:Atraumatic, normocephalic,Grimaces to pain HEENT: Pupils sluggishly reacting to light,no JVD appreciated, no discharge noted. Pulmonary: Clear bilaterally, no weezes, crackles, rhonchi noted  Cardiovascular Normal S1,S2. NoMRG, RRR  Abdomen Obese, round, hypoactive bowel sounds Renal: No costovertebral tenderness  GU: not performed Endocrine: No evident thyromegaly. Skin: warm,, no ecchymosis ,cellulitis present , red Extremities: +3 pitting edema, warm to touch, red GCs<8T  LABS:  CBC  Recent Labs Lab 04/01/16 0442 04/02/16 0425 04/03/16 0601  WBC 6.9 8.8 8.2  HGB 7.8* 7.7* 7.2*  HCT 25.8* 25.1* 22.4*  PLT 178 161 139*   Coag's No results for input(s): APTT, INR in the last 168 hours. BMET  Recent Labs Lab 03/31/16 0520 04/01/16 0442 04/02/16 0425  NA 138 140 140  K 4.6 4.3 4.1  CL 106 107 104  CO2 23 26 27   BUN 44* 53* 56*  CREATININE 4.57* 4.93* 4.69*  GLUCOSE 104* 117* 118*   Electrolytes  Recent Labs Lab 03/29/16 0552 03/30/16 0401 03/31/16 0520 04/01/16 0442 04/02/16 0425  CALCIUM 7.9* 8.1* 9.0 8.8* 8.8*  PHOS 5.4* 5.6*  --   --   --    Sepsis Markers No results  for input(s): LATICACIDVEN, PROCALCITON, O2SATVEN in the last 168 hours. ABG  Recent Labs Lab 04/01/16 0900 04/02/16 0935 04/03/16 0900  PHART 7.29* 7.39 7.36  PCO2ART 53* 45 54*  PO2ART 80* 139* 87   Liver Enzymes  Recent Labs Lab 03/28/16 0349 03/29/16 0552 03/30/16 0401 04/01/16 0442  AST 29  --   --  17  ALT 11*  --   --  9*  ALKPHOS 30*  --   --  28*  BILITOT 1.0  --   --  0.5  ALBUMIN  2.4* 2.2* 2.1* 2.3*   Cardiac Enzymes No results for input(s): TROPONINI, PROBNP in the last 168 hours. Glucose  Recent Labs Lab 04/02/16 1108 04/02/16 1807 04/02/16 2035 04/03/16 0001 04/03/16 0342 04/03/16 0736  GLUCAP 107* 108* 106* 96 98 96    Imaging No results found.  ASSESSMENT / PLAN: 14 F in chronically very poor health developed abdominal pain and EMS was dispatched. Suffered respiratory arrest and brief cardiac arrest in the transfer from her bed to wheelchair and into ambulance. Intubated and transported to ED. Suffered brief PEA arrest again in ICU shortly after arrival   PULMONARY A: Respiratory arrest COPD OSA Smoker Concern for PE given numerous RFs - no CTA given AKI.  Respiratory acidosis P:  Continue anticoagulation empirically.  Continue vent support, not weaning candidate at this time.  Continue Albuterol, pulmicort Nebs.  Vent adjustment, wean, per protocol  CXR prn and Labs prn  CARDIOVASCULAR A:  Cardiac arrest due to respiratory arrest Hypertension Hyperlipidemia P:  Echo indicative of Four-chamber dilated patient, with moderate left ventricle systolic dysfunction and mild diastolic dysfunction with diffuse hypokinesis  MAP goal > 65 mmHg  RENAL A:  AKI, increased creatinine today.  Hyperkalemia Very poor candidate for HD P:  Monitor I/O, monitor kidney function.  Continue IVF. Nephrology following the patient  CRRT Per nephrology - starting 4/21  GASTROINTESTINAL A:  Morbid obesity Abdominal pain P:  SUP: IV PPI TF protocol  . HEMATOLOGIC A:  Anemia with microcytosis - no overt bleeding P:  DVT px: full dose heparin heparin Monitor CBC intermittently Transfuse per usual guidelines  INFECTIOUS A:  Possible abdominal sepsis Possible PNA Possible LE cellulitis with prior hx of MRSA cellulitis ESBL in the urine P:  Monitor temp, WBC count Micro and abx as above Continue vanc and  meropenem for now  ENDOCRINE A:  Suspect insulin resistance though no history of DM Glucose controlled.  Hypothyroidism P:  IV L-thyroxine. Transition to enteral when able SSI - mod scale  NEUROLOGIC A:  Post arrest anoxic encephalopathy ICU associated discomfort P:  RASS goal: -2, -3 PAD protocol Daily WUA CT head was negative for any acute abnormality  Disposition and family update:  Awaiting bed availability at transfer facility Family updated yesterday, will update today  Best Practice: Code Status:  Full. Diet: HPO GI prophylaxis: PP  I have personally obtained a history, examined the patient, evaluated Pertinent laboratory and RadioGraphic/imaging results, and  formulated the assessment and plan   The Patient requires high complexity decision making for assessment and support, frequent evaluation and titration of therapies, application of advanced monitoring technologies and extensive interpretation of multiple databases. Critical Care Time devoted to patient care services described in this note is 35 minutes.   Overall, patient is critically ill, prognosis is guarded.  Patient with Multiorgan failure and at high risk for cardiac arrest and death.    Corrin Parker, M.D.  [facility]  Medical Director [place] Director [facility] Cardio-Pulmonary Department

## 2016-04-03 NOTE — Progress Notes (Signed)
[place] transport center called to get an update on patient. Requested LOC, recent V.S and vent settings. Pt still on list for medical ICU.

## 2016-04-03 NOTE — Progress Notes (Signed)
[facility]  ROUNDING NOTE   Subjective:   Family at bedside.   Hemodialysis treatment yesterday. Tolerated treatment well. UF 1068m.  UOP 200  T min 95.2  Objective:  Vital signs in last 24 hours:  Temp:  [95.2 F (35.1 C)-98.6 F (37 C)] 98.4 F (36.9 C) (04/23 0400) Pulse Rate:  [86-107] 107 (04/23 0900) Resp:  [23-33] 27 (04/23 0900) BP: (120-157)/(42-62) 144/48 mmHg (04/23 0900) SpO2:  [95 %-100 %] 96 % (04/23 0900) FiO2 (%):  [35 %] 35 % (04/23 0400)  Weight change:  Filed Weights   03/30/16 0438 03/31/16 0500 04/01/16 0641  Weight: 177.3 kg (390 lb 14 oz) 179.5 kg (395 lb 11.6 oz) 179.1 kg (394 lb 13.5 oz)    Intake/Output: I/O last 3 completed shifts: In: 2100 [I.V.:700; NG/GT:1400] Out: 2375 [Urine:275; Emesis/NG output:100; Other:1000; Stool:1000]   Intake/Output this shift:  Total I/O In: 180 [I.V.:60; NG/GT:120] Out: -   Physical Exam: General: Critically ill  Head: ETT OGT  Eyes: Eyes closed  Neck: trachea midline  Lungs:  Coarse breath sounds, PRVC FiO2 35%  Heart: Regular rate and rhythm  Abdomen:  Soft, nontender, obese, no bowel sounds  Extremities: nonpitting peripheral edema.  Neurologic: Sedated, intubated  Skin: Erythema bilateral lower extremities  Access: vascath 4/17 Dr. KMortimer Fries   Basic Metabolic Panel:  Recent Labs Lab 03/29/16 0552 03/30/16 0401 03/31/16 0520 04/01/16 0442 04/02/16 0425  NA 135 136 138 140 140  K 4.2 4.4 4.6 4.3 4.1  CL 105 105 106 107 104  CO2 24 22 23 26 27   GLUCOSE 83 77 104* 117* 118*  BUN 32* 34* 44* 53* 56*  CREATININE 3.46* 4.12* 4.57* 4.93* 4.69*  CALCIUM 7.9* 8.1* 9.0 8.8* 8.8*  PHOS 5.4* 5.6*  --   --   --     Liver Function Tests:  Recent Labs Lab 03/28/16 0349 03/29/16 0552 03/30/16 0401 04/01/16 0442  AST 29  --   --  17  ALT 11*  --   --  9*  ALKPHOS 30*  --   --  28*  BILITOT 1.0  --   --  0.5  PROT 6.3*  --   --  6.6  ALBUMIN 2.4* 2.2* 2.1* 2.3*   No  results for input(s): LIPASE, AMYLASE in the last 168 hours. No results for input(s): AMMONIA in the last 168 hours.  CBC:  Recent Labs Lab 03/30/16 0401 03/31/16 0526 04/01/16 0442 04/02/16 0425 04/03/16 0601  WBC 6.5 9.1 6.9 8.8 8.2  HGB 7.6* 8.5* 7.8* 7.7* 7.2*  HCT 24.4* 27.4* 25.8* 25.1* 22.4*  MCV 75.2* 74.2* 75.8* 73.8* 73.6*  PLT 190 191 178 161 139*    Cardiac Enzymes: No results for input(s): CKTOTAL, CKMB, CKMBINDEX, TROPONINI in the last 168 hours.  BNP: Invalid input(s): POCBNP  CBG:  Recent Labs Lab 04/02/16 1807 04/02/16 2035 04/03/16 0001 04/03/16 0342 04/03/16 0736  GLUCAP 108* 106* 96 98 96    Microbiology: Results for orders placed or performed during the hospital encounter of 03/25/16  Culture, blood (routine x 2)     Status: None   Collection Time: 03/25/16  7:19 AM  Result Value Ref Range Status   Specimen Description BLOOD LEFT FOREARM  Final   Special Requests BOTTLES DRAWN AEROBIC AND ANAEROBIC  1CC  Final   Culture NO GROWTH 5 DAYS  Final   Report Status 03/30/2016 FINAL  Final  Culture, blood (routine x 2)  Status: None   Collection Time: 03/25/16  7:19 AM  Result Value Ref Range Status   Specimen Description BLOOD LEFT HAND  Final   Special Requests BOTTLES DRAWN AEROBIC AND ANAEROBIC  1CC  Final   Culture NO GROWTH 5 DAYS  Final   Report Status 03/30/2016 FINAL  Final  MRSA PCR Screening     Status: Abnormal   Collection Time: 03/25/16 12:42 PM  Result Value Ref Range Status   MRSA by PCR POSITIVE (A) NEGATIVE Final    Comment:        The GeneXpert MRSA Assay (FDA approved for NASAL specimens only), is one component of a comprehensive MRSA colonization surveillance program. It is not intended to diagnose MRSA infection nor to guide or monitor treatment for MRSA infections. CRITICAL RESULT CALLED TO, READ BACK BY AND VERIFIED WITH:  AMELIA BERRY AT 1644 03/25/16 SDR   Urine culture     Status: Abnormal   Collection  Time: 03/26/16  1:02 AM  Result Value Ref Range Status   Specimen Description URINE, RANDOM  Final   Special Requests NONE  Final   Culture (A)  Final    20,000 COLONIES/mL ESCHERICHIA COLI Results Called to: AMELIA BERRY, RN  AT 1055 ON 03/28/16 BY CTJ. ESBL-EXTENDED SPECTRUM BETA LACTAMASE-THE ORGANISM IS RESISTANT TO PENICILLINS, CEPHALOSPORINS AND AZTREONAM ACCORDING TO CLSI M100-S15 VOL.[place].    Report Status 03/28/2016 FINAL  Final   Organism ID, Bacteria ESCHERICHIA COLI (A)  Final      Susceptibility   Escherichia coli - MIC*    AMPICILLIN >=32 RESISTANT Resistant     CEFAZOLIN >=64 RESISTANT Resistant     CEFTRIAXONE >=64 RESISTANT Resistant     CIPROFLOXACIN >=4 RESISTANT Resistant     GENTAMICIN <=1 SENSITIVE Sensitive     IMIPENEM <=0.25 SENSITIVE Sensitive     NITROFURANTOIN <=16 SENSITIVE Sensitive     TRIMETH/SULFA >=320 RESISTANT Resistant     AMPICILLIN/SULBACTAM 16 INTERMEDIATE Intermediate     PIP/TAZO <=4 SENSITIVE Sensitive     Extended ESBL POSITIVE Resistant     * 20,000 COLONIES/mL ESCHERICHIA COLI  Culture, respiratory (tracheal aspirate)     Status: None (Preliminary result)   Collection Time: 03/30/16  8:50 AM  Result Value Ref Range Status   Specimen Description TRACHEAL ASPIRATE  Final   Special Requests NONE  Final   Gram Stain FEW WBC SEEN RARE GRAM NEGATIVE RODS   Final   Culture   Final    MODERATE GROWTH METHICILLIN RESISTANT STAPHYLOCOCCUS AUREUS TRYING TO ISOLATE OTHER POSSIBLE PATHOGENS    Report Status PENDING  Incomplete   Organism ID, Bacteria METHICILLIN RESISTANT STAPHYLOCOCCUS AUREUS  Final      Susceptibility   Methicillin resistant staphylococcus aureus - MIC*    CIPROFLOXACIN <=0.5 SENSITIVE Sensitive     ERYTHROMYCIN >=8 RESISTANT Resistant     GENTAMICIN <=0.5 SENSITIVE Sensitive     OXACILLIN >=4 RESISTANT Resistant     TETRACYCLINE 2 SENSITIVE Sensitive     VANCOMYCIN 1 SENSITIVE Sensitive     TRIMETH/SULFA  >=320 RESISTANT Resistant     CLINDAMYCIN >=8 RESISTANT Resistant     RIFAMPIN <=0.5 SENSITIVE Sensitive     Inducible Clindamycin NEGATIVE Sensitive     * MODERATE GROWTH METHICILLIN RESISTANT STAPHYLOCOCCUS AUREUS    Coagulation Studies: No results for input(s): LABPROT, INR in the last 72 hours.  Urinalysis: No results for input(s): COLORURINE, LABSPEC, PHURINE, GLUCOSEU, HGBUR, BILIRUBINUR, KETONESUR, PROTEINUR, UROBILINOGEN, NITRITE, LEUKOCYTESUR in  the last 72 hours.  Invalid input(s): APPERANCEUR    Imaging: Dg Chest Port 1 View  04/02/2016  CLINICAL DATA:  Acute respiratory failure. EXAM: PORTABLE CHEST 1 VIEW COMPARISON:  04/01/2016 FINDINGS: Lungs mildly hyperexpanded. There are prominent interstitial markings, stable from previous day's exam. No convincing pneumonia or pulmonary edema. No pleural effusion or pneumothorax. Cardiac silhouette is normal in size. Endotracheal tube tip projects 2 cm above the Carina, well positioned. Oral/nasogastric tube passes below the diaphragm into the stomach. Left internal jugular dual-lumen tunneled central venous catheter tip projects in the right atrium. Right subclavian central venous line catheter tip projects in the mid superior vena cava. No change in support apparatus. IMPRESSION: 1. Prominent interstitial markings at lung hyperexpansion. Overall, lung aeration has continuing improved since studies dated 03/25/2016. No new lung abnormalities. 2. Support apparatus is stable as described. Electronically Signed   By: Lajean Manes M.D.   On: 04/02/2016 07:51   Dg Chest Port 1 View  04/01/2016  CLINICAL DATA:  Respiratory arrest, cardiopulmonary arrest with resuscitation, morbid obesity, asthma, former smoker. EXAM: PORTABLE CHEST 1 VIEW COMPARISON:  Portable chest x-ray of March 28, 2016 FINDINGS: The lungs are well-expanded. The hemidiaphragms are excluded from the field of view however. The heart is normal in size. The central pulmonary  vascularity is engorged. The pulmonary interstitial markings remain increased. The endotracheal tube tip lies 2.7 cm above the carina. The esophagogastric tube tip projects below the inferior margin of the image. The dialysis catheter tip projects below the inferior margin of the image is well. The right subclavian venous catheter tip projects over the proximal SVC. IMPRESSION: Limited study with exclusion of the hemidiaphragms. There is persistent pulmonary interstitial edema and mild cardiomegaly. There is no pneumothorax. The support tubes are in reasonable position where visualized. Electronically Signed   By: David  Martinique M.D.   On: 04/01/2016 10:13     Medications:   . feeding supplement (VITAL HIGH PROTEIN) 1,000 mL (04/03/16 0900)  . fentaNYL infusion INTRAVENOUS Stopped (03/29/16 0837)  . heparin 2,000 Units/hr (04/03/16 0900)  . midazolam (VERSED) infusion Stopped (03/29/16 0837)  . norepinephrine Stopped (03/28/16 2331)   . antiseptic oral rinse  7 mL Mouth Rinse 10 times per day  . budesonide (PULMICORT) nebulizer solution  0.25 mg Nebulization 4 times per day  . chlorhexidine gluconate (SAGE KIT)  15 mL Mouth Rinse BID  . feeding supplement (PRO-STAT SUGAR FREE 64)  30 mL Per Tube QID  . fentaNYL (SUBLIMAZE) injection  50 mcg Intravenous Once  . insulin aspart  0-15 Units Subcutaneous 6 times per day  . ipratropium-albuterol  3 mL Nebulization Q6H  . levothyroxine  37.5 mcg Intravenous Daily  . meropenem (MERREM) IV  500 mg Intravenous Q24H  . pantoprazole (PROTONIX) IV  40 mg Intravenous QHS  . polyethylene glycol  17 g Oral BID  . senna-docusate  1 tablet Oral BID   sodium chloride, acetaminophen, albuterol, fentaNYL, midazolam, ondansetron (ZOFRAN) IV, sodium chloride flush  Assessment/ Plan:  Ms. Tasha Long is a 60 y.o. white female with chronic respiratory failure due to COPD, morbid obesity, obstructive sleep apnea requiring CPAP, chronic severe  bilateral lymphedema in the legs, bipolar disorder, anxiety, IBS, GERD, hypertension, hyperlipidemia, hypothyroidism, chronic stasis dermatiti , was admitted on 03/25/2016 with Acute respiratory distress.   1. Acute renal failure on chronic kidney disease stage III with baseline creatinine of 1.2, eGFR of 49: anuric. Secondary to ATN. With elevated vancomycin levels.  Overall  prognosis poor. Discussed with patient's family - Evaluate daily for hemodialysis. Tentatively plan for dialysis treatment tomorrow - Renally dose all medications. Monitor vanco levels. Avoid nephrotoxic agents.   3. Acute respiratory failure: intubated on mechanical ventilation. with multilobar pneumonia.  - meropenem. Continue supportive care  3. Sepsis/Hypotension secondary to ESBL E. Coli Urinary tract infection and pneumonia (sputum with MRSA): no longer on vasopressors.  - meropenem  4. Anemia: today with drop in platelets.  - low threshold for epo and/or PRBC transfusion.    LOS: [place], [place] 4/23/20179:22 AM

## 2016-04-03 NOTE — Progress Notes (Signed)
Pharmacy Antibiotic Note  Tasha Long is a 60 y.o. female admitted on 03/25/2016 with respiratory distress.   Pharmacy has been consulted for vancomycin and meropenem for MRSA PNA and ESBL E. Coli UTI. Now with AKI.  Plan: Patient now on IHD with sessions on 4/21 (2 hrs) and 4/22 (2.5 hrs). Vancomycin level 4/23: 6mcg/ml. Target: 15-57mcg/ml. Will continue checking levels and redose when </= 28mcg/ml.   Continue meropenem 500mg  IV Q24H to be given after dialysis on HD days.   Height: 5\' 5"  (165.1 cm) Weight: (!) 394 lb 13.5 oz (179.1 kg) IBW/kg (Calculated) : 57  Temp (24hrs), Avg:97.9 F (36.6 C), Min:95.2 F (35.1 C), Max:98.6 F (37 C)   Recent Labs Lab 03/27/16 1626  03/29/16 0552  03/30/16 0401 03/31/16 0520 03/31/16 0526 04/01/16 0442 04/01/16 0805 04/02/16 0425 04/03/16 0601  WBC  --   < > 5.4  --  6.5  --  9.1 6.9  --  8.8 8.2  CREATININE  --   < > 3.46*  --  4.12* 4.57*  --  4.93*  --  4.69*  --   VANCOTROUGH 46*  --   --   --   --   --   --   --   --   --   --   VANCORANDOM  --   < >  --   < >  --   --   --   --  27  --  21  < > = values in this interval not displayed.  Estimated Creatinine Clearance: 21.8 mL/min (by C-G formula based on Cr of 4.69).    Allergies  Allergen Reactions  . Codeine Hives  . Sulfa Antibiotics Hives    Antimicrobials this admission: Zosyn 4/14 >> 4/14 vancomycin 4/14 >>  Cefepime 4/14 >> 4/17 Metronidazole 4/14 >> 4/17 Meropenem 4/17 >>  Dose adjustments this admission: Merrem 500 mg IV q24 hours 4/18   Microbiology results: 4/19 TA: MRSA 4/14 BCx: negative UCx: 20k ESBL E coli MRSA PCR: positive  Thank you for allowing pharmacy to be a part of this patient's care.  Marcin Holte C, Pharm.D. Clinical Pharmacist 04/03/2016 9:38 AM

## 2016-04-03 NOTE — Progress Notes (Signed)
Family here most of afternoon, state patient is more awake and alert. Patient will open her eyes, move her arms at times, but will not track or follow consistent commands. Foley, rectal tube and central line in place, VSS.

## 2016-04-03 NOTE — Progress Notes (Signed)
Pt has had no change in condition. Remains unresponsive to stimuli. HR NSR to ST this shift.  FBS required no coverage this shift. Continue on tube feeding. On speciality bariatric bed.  CXR yesterday showed some improvement. Poor urinary output, flexi seal in place. Had fentanyl x 2 this shift for signs of discomfort and accelerated heart rate. No SVT reported from central telemetry monitoring.

## 2016-04-03 NOTE — Progress Notes (Signed)
Pharmacy Antibiotic Note  Tasha Long is a 60 y.o. female admitted on 03/25/2016 with respiratory distress ordered empiric abx and ESBL Ecoli.  Pharmacy has been consulted for vancomycin and Merrem   Plan: Per Nephrology note 4/21, plan to start HD. However it appears that the patient is pending transfer to [facility] and the family prefers to defer dialysis until then. Will continue to follow and adjust if HD initiated. Otherwise will continue with current plan:  Repeat vancomycin level 27  mcg/mL. Continue to hold vancomycin. Will recheck random level with am labs on Sunday. Renal function continues to worsen in this patient.   4/23 AM vanc level 21. Recheck with tomorrow AM labs.  Will continue  Merrem 500 mg IV q 24 hours since patient is in AKI and serum creatinine continues to increase. Dosed as if patient's CrCl <10 ml/min since Crcl can't accurately estimated renal function in AKI.   Height: 5\' 5"  (165.1 cm) Weight: (!) 394 lb 13.5 oz (179.1 kg) IBW/kg (Calculated) : 57  Temp (24hrs), Avg:98 F (36.7 C), Min:95.2 F (35.1 C), Max:98.8 F (37.1 C)   Recent Labs Lab 03/27/16 1626  03/29/16 0552  03/30/16 0401 03/31/16 0520 03/31/16 0526 04/01/16 0442 04/01/16 0805 04/02/16 0425 04/03/16 0601  WBC  --   < > 5.4  --  6.5  --  9.1 6.9  --  8.8 8.2  CREATININE  --   < > 3.46*  --  4.12* 4.57*  --  4.93*  --  4.69*  --   VANCOTROUGH 46*  --   --   --   --   --   --   --   --   --   --   VANCORANDOM  --   < >  --   < >  --   --   --   --  27  --  21  < > = values in this interval not displayed.  Estimated Creatinine Clearance: 21.8 mL/min (by C-G formula based on Cr of 4.69).    Allergies  Allergen Reactions  . Codeine Hives  . Sulfa Antibiotics Hives    Antimicrobials this admission: Zosyn 4/14 >> 4/14 vancomycin 4/14 >>  Cefepime 4/14 >> 4/17 Metronidazole 4/14 >> 4/17 Meropenem 4/17 >>  Dose adjustments this admission: Merrem 500 mg IV q24 hours 4/18    Microbiology results: 4/19 TA: MRSA 4/14 BCx: negative UCx: 20k ESBL E coli MRSA PCR: positive  Thank you for allowing pharmacy to be a part of this patient's care.  Kemari Narez S, Pharm.D. Clinical Pharmacist 04/03/2016 6:48 AM

## 2016-04-03 NOTE — Progress Notes (Signed)
CRITICAL VALUE ALERT  Critical value received: H/H 7.2/22.4 Date of notification:  04/03/2016 Nurse who received alert:  Bridgette Habermann RN NP notified:  Dorene Sorrow   [phone]

## 2016-04-03 NOTE — Progress Notes (Signed)
ANTICOAGULATION CONSULT NOTE - Initial Consult  Pharmacy Consult for Heparin Indication: pulmonary embolus  Allergies  Allergen Reactions  . Codeine Hives  . Sulfa Antibiotics Hives   Patient Measurements: Height: _0  (165.1 cm) Weight: (!) 394 lb 13.5 oz (179.1 kg) IBW/kg (Calculated) : 57 Heparin Dosing Weight: 93.6 kg  Vital Signs: Temp: 98.4 F (36.9 C) (04/23 0400) Temp Source: Axillary (04/23 0400) BP: 139/55 mmHg (04/23 0400) Pulse Rate: 98 (04/23 0436)  Labs:  Recent Labs  04/01/16 0442 04/02/16 0425 04/03/16 0601  HGB 7.8* 7.7* 7.2*  HCT 25.8* 25.1* 22.4*  PLT 178 161 139*  HEPARINUNFRC 0.50 0.49 0.44  CREATININE 4.93* 4.69*  --     Estimated Creatinine Clearance: 21.8 mL/min (by C-G formula based on Cr of 4.69).   Medical History: History reviewed. No pertinent past medical history.  Medications:  Scheduled:  . antiseptic oral rinse  7 mL Mouth Rinse 10 times per day  . budesonide (PULMICORT) nebulizer solution  0.25 mg Nebulization 4 times per day  . chlorhexidine gluconate (SAGE KIT)  15 mL Mouth Rinse BID  . feeding supplement (PRO-STAT SUGAR FREE 64)  30 mL Per Tube QID  . fentaNYL (SUBLIMAZE) injection  50 mcg Intravenous Once  . insulin aspart  0-15 Units Subcutaneous 6 times per day  . ipratropium-albuterol  3 mL Nebulization Q6H  . levothyroxine  37.5 mcg Intravenous Daily  . meropenem (MERREM) IV  500 mg Intravenous Q24H  . pantoprazole (PROTONIX) IV  40 mg Intravenous QHS  . polyethylene glycol  17 g Oral BID  . senna-docusate  1 tablet Oral BID   Infusions:  . feeding supplement (VITAL HIGH PROTEIN) 1,000 mL (04/03/16 0615)  . fentaNYL infusion INTRAVENOUS Stopped (03/29/16 0837)  . heparin 2,000 Units/hr (04/02/16 2242)  . midazolam (VERSED) infusion Stopped (03/29/16 0837)  . norepinephrine Stopped (03/28/16 2331)    Assessment: 60 y/o F admitted with respiratory arrest ordered heparin for possible PE.   Goal of Therapy:   Heparin level 0.3-0.7 units/ml Monitor platelets by anticoagulation protocol: Yes   Plan:  Heparin level subtherapeutic. 1500 unit IV x 1 bolus and increase rate to 2000 units/hr. Will recheck level in 6 hours.  4/17 at 21:52  HL = 0.56. Will continue current dosing.  Will recheck HL with AM labs.   4/18 AM heparin level 0.46. Continue current regimen. Recheck with CBC tomorrow AM.  4/19 AM heparin level 0.37. Continue current regimen. Recheck with CBC tomorrow AM.   4/20 AM heparin level 0.49. Continue current regimen. Recheck with CBC tomorrow AM.   4/21 AM heparin level 0.50. Continue current regimen. Recheck with CBC tomorrow AM.   4/22 AM heparin level 0.49. Continue current regimen. Recheck with CBC tomorrow AM.   4/23 AM heparin level 0.44. Continue current regimen. Recheck with CBC tomorrow AM.   Eloise Harman, RPh Clinical Pharmacist 04/03/2016,6:40 AM

## 2016-04-04 DIAGNOSIS — J189 Pneumonia, unspecified organism: Secondary | ICD-10-CM | POA: Insufficient documentation

## 2016-04-04 DIAGNOSIS — J9621 Acute and chronic respiratory failure with hypoxia: Secondary | ICD-10-CM

## 2016-04-04 DIAGNOSIS — J9622 Acute and chronic respiratory failure with hypercapnia: Secondary | ICD-10-CM

## 2016-04-04 DIAGNOSIS — J9690 Respiratory failure, unspecified, unspecified whether with hypoxia or hypercapnia: Secondary | ICD-10-CM | POA: Insufficient documentation

## 2016-04-04 LAB — BLOOD GAS, ARTERIAL
ACID-BASE EXCESS: 4.4 mmol/L — AB (ref 0.0–3.0)
ALLENS TEST (PASS/FAIL): POSITIVE — AB
Acid-Base Excess: 2 mmol/L (ref 0.0–3.0)
Allens test (pass/fail): POSITIVE — AB
BICARBONATE: 30.5 meq/L — AB (ref 21.0–28.0)
Bicarbonate: 27.2 mEq/L (ref 21.0–28.0)
FIO2: 0.35
FIO2: 0.35
LHR: 28 {breaths}/min
MECHVT: 500 mL
MECHVT: 500 mL
O2 SAT: 96.2 %
O2 Saturation: 99.1 %
PATIENT TEMPERATURE: 37
PATIENT TEMPERATURE: 37
PCO2 ART: 54 mmHg — AB (ref 32.0–48.0)
PEEP/CPAP: 5 cmH2O
PEEP/CPAP: 5 cmH2O
PH ART: 7.39 (ref 7.350–7.450)
PO2 ART: 139 mmHg — AB (ref 83.0–108.0)
PO2 ART: 87 mmHg (ref 83.0–108.0)
RATE: 28 resp/min
pCO2 arterial: 45 mmHg (ref 32.0–48.0)
pH, Arterial: 7.36 (ref 7.350–7.450)

## 2016-04-04 LAB — GLUCOSE, CAPILLARY
GLUCOSE-CAPILLARY: 95 mg/dL (ref 65–99)
GLUCOSE-CAPILLARY: 97 mg/dL (ref 65–99)
Glucose-Capillary: 100 mg/dL — ABNORMAL HIGH (ref 65–99)
Glucose-Capillary: 92 mg/dL (ref 65–99)
Glucose-Capillary: 103 mg/dL — ABNORMAL HIGH (ref 65–99)

## 2016-04-04 LAB — CBC
HEMATOCRIT: 22.9 % — AB (ref 35.0–47.0)
Hemoglobin: 7.1 g/dL — ABNORMAL LOW (ref 12.0–16.0)
MCH: 23.3 pg — ABNORMAL LOW (ref 26.0–34.0)
MCHC: 31 g/dL — AB (ref 32.0–36.0)
MCV: 75.4 fL — ABNORMAL LOW (ref 80.0–100.0)
PLATELETS: 156 10*3/uL (ref 150–440)
RBC: 3.04 MIL/uL — AB (ref 3.80–5.20)
RDW: 19.6 % — AB (ref 11.5–14.5)
WBC: 8.3 10*3/uL (ref 3.6–11.0)

## 2016-04-04 LAB — RENAL FUNCTION PANEL
ANION GAP: 10 (ref 5–15)
Albumin: 2.4 g/dL — ABNORMAL LOW (ref 3.5–5.0)
BUN: 72 mg/dL — ABNORMAL HIGH (ref 6–20)
CALCIUM: 9.3 mg/dL (ref 8.9–10.3)
CHLORIDE: 102 mmol/L (ref 101–111)
CO2: 28 mmol/L (ref 22–32)
Creatinine, Ser: 4.74 mg/dL — ABNORMAL HIGH (ref 0.44–1.00)
GFR calc non Af Amer: 9 mL/min — ABNORMAL LOW (ref >60)
GFR, EST AFRICAN AMERICAN: 11 mL/min — AB (ref >60)
Glucose, Bld: 95 mg/dL (ref 65–99)
Phosphorus: 5.5 mg/dL — ABNORMAL HIGH (ref 2.5–4.6)
Potassium: 4 mmol/L (ref 3.5–5.1)
Sodium: 140 mmol/L (ref 135–145)

## 2016-04-04 LAB — HEPARIN LEVEL (UNFRACTIONATED): Heparin Unfractionated: 0.45 IU/mL (ref 0.30–0.70)

## 2016-04-04 LAB — VANCOMYCIN, RANDOM: VANCOMYCIN RM: 19 ug/mL

## 2016-04-04 MED ORDER — FUROSEMIDE 10 MG/ML IJ SOLN
4.0000 mg/h | INTRAVENOUS | Status: DC
  Administered 2016-04-04 – 2016-04-08 (×2): 4 mg/h via INTRAVENOUS
  Filled 2016-04-04 (×4): qty 25

## 2016-04-04 MED ORDER — SENNOSIDES-DOCUSATE SODIUM 8.6-50 MG PO TABS
1.0000 | ORAL_TABLET | Freq: Every day | ORAL | Status: DC
  Administered 2016-04-07 – 2016-04-09 (×2): 1 via ORAL
  Filled 2016-04-04 (×2): qty 1

## 2016-04-04 NOTE — Care Management (Signed)
It was discussed in rounds that family is requesting a second opinion at [facility]. Patient is on waiting list.

## 2016-04-04 NOTE — Progress Notes (Signed)
Chaplain rounded the unit and provided a compassionate presence and spiritual support to the patient and family.  Chaplain Wilbon Obenchain [phone] 

## 2016-04-04 NOTE — Progress Notes (Signed)
[facility]  ROUNDING NOTE   Subjective:   Last HD was Saturday. UF 1000 cc UOP  450 cc last 24 hrs UOP appears to be ~ 30 cc/hr this morning More alert. Trying to track people in room  + dependent edema    Objective:  Vital signs in last 24 hours:  Temp:  [97.5 F (36.4 C)-98.9 F (37.2 C)] 97.5 F (36.4 C) (04/24 1000) Pulse Rate:  [86-110] 97 (04/24 1000) Resp:  [20-35] 26 (04/24 1000) BP: (129-166)/(43-71) 147/71 mmHg (04/24 1000) SpO2:  [90 %-98 %] 92 % (04/24 1000) FiO2 (%):  [35 %] 35 % (04/24 0845)  Weight change:  Filed Weights   03/30/16 0438 03/31/16 0500 04/01/16 0641  Weight: 177.3 kg (390 lb 14 oz) 179.5 kg (395 lb 11.6 oz) 179.1 kg (394 lb 13.5 oz)    Intake/Output: I/O last 3 completed shifts: In: 2210 [I.V.:720; NG/GT:1440; IV Piggyback:50] Out: 1350 [Urine:550; Stool:800]   Intake/Output this shift:  Total I/O In: 180 [I.V.:60; NG/GT:120] Out: -   Physical Exam: General: Critically ill  Head: ETT OGT  Eyes: Eyes open, tracking  Neck: trachea midline  Lungs:  Coarse breath sounds,  FiO2 35%  Heart: Regular rate and rhythm  Abdomen:  Soft, nontender, obese, no bowel sounds  Extremities: Non pitting peripheral edema.  Neurologic:  intubated, trying to follow commands  Skin: Erythema bilateral lower extremities  Access: Left IJ vascath 4/17 Dr. Mortimer Fries    Basic Metabolic Panel:  Recent Labs Lab 03/29/16 0552 03/30/16 0401 03/31/16 0520 04/01/16 0442 04/02/16 0425  NA 135 136 138 140 140  K 4.2 4.4 4.6 4.3 4.1  CL 105 105 106 107 104  CO2 24 22 23 26 27   GLUCOSE 83 77 104* 117* 118*  BUN 32* 34* 44* 53* 56*  CREATININE 3.46* 4.12* 4.57* 4.93* 4.69*  CALCIUM 7.9* 8.1* 9.0 8.8* 8.8*  PHOS 5.4* 5.6*  --   --   --     Liver Function Tests:  Recent Labs Lab 03/29/16 0552 03/30/16 0401 04/01/16 0442  AST  --   --  17  ALT  --   --  9*  ALKPHOS  --   --  28*  BILITOT  --   --  0.5  PROT  --   --  6.6  ALBUMIN 2.2*  2.1* 2.3*   No results for input(s): LIPASE, AMYLASE in the last 168 hours. No results for input(s): AMMONIA in the last 168 hours.  CBC:  Recent Labs Lab 03/31/16 0526 04/01/16 0442 04/02/16 0425 04/03/16 0601 04/04/16 0428  WBC 9.1 6.9 8.8 8.2 8.3  HGB 8.5* 7.8* 7.7* 7.2* 7.1*  HCT 27.4* 25.8* 25.1* 22.4* 22.9*  MCV 74.2* 75.8* 73.8* 73.6* 75.4*  PLT 191 178 161 139* 156    Cardiac Enzymes: No results for input(s): CKTOTAL, CKMB, CKMBINDEX, TROPONINI in the last 168 hours.  BNP: Invalid input(s): POCBNP  CBG:  Recent Labs Lab 04/03/16 1553 04/03/16 2004 04/03/16 2329 04/04/16 0432 04/04/16 0743  GLUCAP 110* 104* 99 100* 92    Microbiology: Results for orders placed or performed during the hospital encounter of 03/25/16  Culture, blood (routine x 2)     Status: None   Collection Time: 03/25/16  7:19 AM  Result Value Ref Range Status   Specimen Description BLOOD LEFT FOREARM  Final   Special Requests BOTTLES DRAWN AEROBIC AND ANAEROBIC  1CC  Final   Culture NO GROWTH 5 DAYS  Final  Report Status 03/30/2016 FINAL  Final  Culture, blood (routine x 2)     Status: None   Collection Time: 03/25/16  7:19 AM  Result Value Ref Range Status   Specimen Description BLOOD LEFT HAND  Final   Special Requests BOTTLES DRAWN AEROBIC AND ANAEROBIC  1CC  Final   Culture NO GROWTH 5 DAYS  Final   Report Status 03/30/2016 FINAL  Final  MRSA PCR Screening     Status: Abnormal   Collection Time: 03/25/16 12:42 PM  Result Value Ref Range Status   MRSA by PCR POSITIVE (A) NEGATIVE Final    Comment:        The GeneXpert MRSA Assay (FDA approved for NASAL specimens only), is one component of a comprehensive MRSA colonization surveillance program. It is not intended to diagnose MRSA infection nor to guide or monitor treatment for MRSA infections. CRITICAL RESULT CALLED TO, READ BACK BY AND VERIFIED WITH:  AMELIA BERRY AT 1644 03/25/16 SDR   Urine culture     Status:  Abnormal   Collection Time: 03/26/16  1:02 AM  Result Value Ref Range Status   Specimen Description URINE, RANDOM  Final   Special Requests NONE  Final   Culture (A)  Final    20,000 COLONIES/mL ESCHERICHIA COLI Results Called to: AMELIA BERRY, RN  AT 1055 ON 03/28/16 BY CTJ. ESBL-EXTENDED SPECTRUM BETA LACTAMASE-THE ORGANISM IS RESISTANT TO PENICILLINS, CEPHALOSPORINS AND AZTREONAM ACCORDING TO CLSI M100-S15 VOL.[place].    Report Status 03/28/2016 FINAL  Final   Organism ID, Bacteria ESCHERICHIA COLI (A)  Final      Susceptibility   Escherichia coli - MIC*    AMPICILLIN >=32 RESISTANT Resistant     CEFAZOLIN >=64 RESISTANT Resistant     CEFTRIAXONE >=64 RESISTANT Resistant     CIPROFLOXACIN >=4 RESISTANT Resistant     GENTAMICIN <=1 SENSITIVE Sensitive     IMIPENEM <=0.25 SENSITIVE Sensitive     NITROFURANTOIN <=16 SENSITIVE Sensitive     TRIMETH/SULFA >=320 RESISTANT Resistant     AMPICILLIN/SULBACTAM 16 INTERMEDIATE Intermediate     PIP/TAZO <=4 SENSITIVE Sensitive     Extended ESBL POSITIVE Resistant     * 20,000 COLONIES/mL ESCHERICHIA COLI  Culture, respiratory (tracheal aspirate)     Status: None (Preliminary result)   Collection Time: 03/30/16  8:50 AM  Result Value Ref Range Status   Specimen Description TRACHEAL ASPIRATE  Final   Special Requests NONE  Final   Gram Stain FEW WBC SEEN RARE GRAM NEGATIVE RODS   Final   Culture   Final    MODERATE GROWTH METHICILLIN RESISTANT STAPHYLOCOCCUS AUREUS LIGHT GROWTH OCHROBACTRUM ANTHROPI BETA LACTAMASE POSITIVE    Report Status PENDING  Incomplete   Organism ID, Bacteria METHICILLIN RESISTANT STAPHYLOCOCCUS AUREUS  Final      Susceptibility   Methicillin resistant staphylococcus aureus - MIC*    CIPROFLOXACIN <=0.5 SENSITIVE Sensitive     ERYTHROMYCIN >=8 RESISTANT Resistant     GENTAMICIN <=0.5 SENSITIVE Sensitive     OXACILLIN >=4 RESISTANT Resistant     TETRACYCLINE 2 SENSITIVE Sensitive     VANCOMYCIN 1  SENSITIVE Sensitive     TRIMETH/SULFA >=320 RESISTANT Resistant     CLINDAMYCIN >=8 RESISTANT Resistant     RIFAMPIN <=0.5 SENSITIVE Sensitive     Inducible Clindamycin NEGATIVE Sensitive     * MODERATE GROWTH METHICILLIN RESISTANT STAPHYLOCOCCUS AUREUS    Coagulation Studies: No results for input(s): LABPROT, INR in the last 72 hours.  Urinalysis: No results for input(s): COLORURINE, LABSPEC, PHURINE, GLUCOSEU, HGBUR, BILIRUBINUR, KETONESUR, PROTEINUR, UROBILINOGEN, NITRITE, LEUKOCYTESUR in the last 72 hours.  Invalid input(s): APPERANCEUR    Imaging: Dg Chest 1 View  04/03/2016  CLINICAL DATA:  Cardiopulmonary arrest.  Respiratory failure EXAM: CHEST 1 VIEW FINDINGS: Endo trachea tube, feeding tube, LEFT RIGHT central venous line unchanged. Stable cardiac silhouette. No effusion no pneumothorax. Mild central venous congestion. No pulmonary edema. IMPRESSION: Stable support apparatus. Central venous congestion.  No interval change. Electronically Signed   By: Suzy Bouchard M.D.   On: 04/03/2016 11:44     Medications:   . feeding supplement (VITAL HIGH PROTEIN) 1,000 mL (04/04/16 0602)  . fentaNYL infusion INTRAVENOUS Stopped (03/29/16 0837)  . heparin 2,000 Units/hr (04/04/16 0206)  . midazolam (VERSED) infusion Stopped (03/29/16 0837)  . norepinephrine Stopped (03/28/16 2331)   . antiseptic oral rinse  7 mL Mouth Rinse 10 times per day  . budesonide (PULMICORT) nebulizer solution  0.25 mg Nebulization 4 times per day  . chlorhexidine gluconate (SAGE KIT)  15 mL Mouth Rinse BID  . feeding supplement (PRO-STAT SUGAR FREE 64)  30 mL Per Tube QID  . fentaNYL (SUBLIMAZE) injection  50 mcg Intravenous Once  . insulin aspart  0-15 Units Subcutaneous 6 times per day  . ipratropium-albuterol  3 mL Nebulization Q6H  . levothyroxine  37.5 mcg Intravenous Daily  . meropenem (MERREM) IV  500 mg Intravenous Q24H  . pantoprazole (PROTONIX) IV  40 mg Intravenous QHS  . polyethylene  glycol  17 g Oral BID  . senna-docusate  1 tablet Oral BID   sodium chloride, acetaminophen, albuterol, fentaNYL, midazolam, ondansetron (ZOFRAN) IV, sodium chloride flush  Assessment/ Plan:  Ms. Tasha Long is a 60 y.o. 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   1. Acute renal failure on chronic kidney disease stage III with baseline creatinine of 1.2, eGFR of 49: anuric. Secondary to ATN. With elevated vancomycin levels.    - Evaluate daily for hemodialysis. UOP slightly better, there fore hold HD today - trial of low dose lasix drip - Renally dose all medications. Monitor vanco levels. Avoid nephrotoxic agents.   2. Acute respiratory failure: intubated on mechanical ventilation. with multilobar pneumonia.   3. Sepsis/Hypotension secondary to ESBL E. Coli Urinary tract infection and pneumonia (sputum with MRSA):  no longer on vasopressors.    4. Generalized edema - Lasix drip   LOS: 10 Judson Tsan 4/24/201710:47 AM

## 2016-04-04 NOTE — Progress Notes (Signed)
Nutrition Follow-up  DOCUMENTATION CODES:   Morbid obesity  INTERVENTION:  -Recommend continuing current TF regimen of Vital High Protein at 40 ml/hr, Prostat QID, maintenance free water flushes at present. Continue to assess  NUTRITION DIAGNOSIS:   Inadequate oral intake related to acute illness as evidenced by NPO status.  Being addressed via TF  GOAL:   Provide needs based on ASPEN/SCCM guidelines  MONITOR:   TF tolerance, Vent status, Labs, Skin, I & O's  REASON FOR ASSESSMENT:   Ventilator    ASSESSMENT:   Mental status improving, pt remains on vent support, continues without sedation.   Last HD was Saturday (04/02/16) with UF 1000 mL, holding on HD today UOP 450 mL, flexiseal with 300 mL liquid stool, pt with generalized edema  Tolerating Vital High Protein at rate of 40 ml/hr, Prostat QID via OG tube  Diet Order:   NPO  Skin:  Reviewed, no issues  Last BM:  04/04/16 diarrhea, rectal tube in place  Labs: reviewed  Meds: lasix infusion, ss novolog  Height:   Ht Readings from Last 1 Encounters:  03/28/16 5\' 5"  (1.651 m)    Weight:   Wt Readings from Last 1 Encounters:  04/01/16 394 lb 13.5 oz (179.1 kg)    Ideal Body Weight:  56.8 kg  BMI:  Body mass index is 65.71 kg/(m^2).  Estimated Nutritional Needs:   Kcal:  1254-1425 kcals (22-25 kcals/kg IBW per ASPEN guidelines for BMI >50)  Protein:  143 g (2.5 g/kg)   Fluid:  >1.7 L  EDUCATION NEEDS:   Education needs no appropriate at this time  [place], [place], [place] [phone] Pager  [phone] Weekend/On-Call Pager

## 2016-04-04 NOTE — Progress Notes (Signed)
ANTICOAGULATION CONSULT NOTE - Initial Consult  Pharmacy Consult for Heparin Indication: pulmonary embolus  Allergies  Allergen Reactions  . Codeine Hives  . Sulfa Antibiotics Hives   Patient Measurements: Height: 5' 5"  (165.1 cm) Weight: (!) 394 lb 13.5 oz (179.1 kg) IBW/kg (Calculated) : 57 Heparin Dosing Weight: 93.6 kg  Vital Signs: Temp: 98.5 F (36.9 C) (04/24 0400) Temp Source: Axillary (04/24 0400) BP: 129/43 mmHg (04/24 0500) Pulse Rate: 86 (04/24 0500)  Labs:  Recent Labs  04/02/16 0425 04/03/16 0601 04/04/16 0428  HGB 7.7* 7.2* 7.1*  HCT 25.1* 22.4* 22.9*  PLT 161 139* 156  HEPARINUNFRC 0.49 0.44 0.45  CREATININE 4.69*  --   --     Estimated Creatinine Clearance: 21.8 mL/min (by C-G formula based on Cr of 4.69).   Medical History: History reviewed. No pertinent past medical history.  Medications:  Scheduled:  . antiseptic oral rinse  7 mL Mouth Rinse 10 times per day  . budesonide (PULMICORT) nebulizer solution  0.25 mg Nebulization 4 times per day  . chlorhexidine gluconate (SAGE KIT)  15 mL Mouth Rinse BID  . feeding supplement (PRO-STAT SUGAR FREE 64)  30 mL Per Tube QID  . fentaNYL (SUBLIMAZE) injection  50 mcg Intravenous Once  . insulin aspart  0-15 Units Subcutaneous 6 times per day  . ipratropium-albuterol  3 mL Nebulization Q6H  . levothyroxine  37.5 mcg Intravenous Daily  . meropenem (MERREM) IV  500 mg Intravenous Q24H  . pantoprazole (PROTONIX) IV  40 mg Intravenous QHS  . polyethylene glycol  17 g Oral BID  . senna-docusate  1 tablet Oral BID   Infusions:  . feeding supplement (VITAL HIGH PROTEIN) 1,000 mL (04/03/16 1600)  . fentaNYL infusion INTRAVENOUS Stopped (03/29/16 0837)  . heparin 2,000 Units/hr (04/04/16 0206)  . midazolam (VERSED) infusion Stopped (03/29/16 0837)  . norepinephrine Stopped (03/28/16 2331)    Assessment: 60 y/o F admitted with respiratory arrest ordered heparin for possible PE.   Goal of Therapy:   Heparin level 0.3-0.7 units/ml Monitor platelets by anticoagulation protocol: Yes   Plan:  Heparin level subtherapeutic. 1500 unit IV x 1 bolus and increase rate to 2000 units/hr. Will recheck level in 6 hours.  4/17 at 21:52  HL = 0.56. Will continue current dosing.  Will recheck HL with AM labs.   4/18 AM heparin level 0.46. Continue current regimen. Recheck with CBC tomorrow AM.  4/19 AM heparin level 0.37. Continue current regimen. Recheck with CBC tomorrow AM.   4/20 AM heparin level 0.49. Continue current regimen. Recheck with CBC tomorrow AM.   4/21 AM heparin level 0.50. Continue current regimen. Recheck with CBC tomorrow AM.   4/22 AM heparin level 0.49. Continue current regimen. Recheck with CBC tomorrow AM.   4/23 AM heparin level 0.44. Continue current regimen. Recheck with CBC tomorrow AM.   4/24 AM heparin level 0.45. Continue current regimen. Recheck with CBC tomorrow AM.    Eloise Harman, RPh Clinical Pharmacist 04/04/2016,5:10 AM

## 2016-04-04 NOTE — Progress Notes (Signed)
Pt more alert and has been opening her eyes spontaneously. Medicated almost regularly with fentanyl for signs of discomfort and restlessness. Family has been in and out of room all shift. Pt continues on dialysis, feeding tube, and heparin gtt. Output improved but still low, and rectal tube intact.

## 2016-04-04 NOTE — Progress Notes (Signed)
Sarah from [facility] transport called for update on pt. Pt still on list for bed.

## 2016-04-04 NOTE — Progress Notes (Deleted)
Notified ICU nurse practioner [place] in regards to patient having 18 beat run of V-tach with reentry of NSR. Stated will check magnesium level.

## 2016-04-04 NOTE — Progress Notes (Signed)
Subjective: Patient with improved mental status  Objective: Current vital signs: BP 147/71 mmHg  Pulse 97  Temp(Src) 97.5 F (36.4 C) (Axillary)  Resp 26  Ht _0  (1.651 m)  Wt 179.1 kg (394 lb 13.5 oz)  BMI 65.71 kg/m2  SpO2 96% Vital signs in last 24 hours: Temp:  [97.5 F (36.4 C)-98.7 F (37.1 C)] 97.5 F (36.4 C) (04/24 1000) Pulse Rate:  [86-110] 97 (04/24 1000) Resp:  [20-35] 26 (04/24 1000) BP: (129-166)/(43-71) 147/71 mmHg (04/24 1000) SpO2:  [90 %-98 %] 96 % (04/24 1151) FiO2 (%):  [35 %] 35 % (04/24 1151)  Intake/Output from previous day: 04/23 0701 - 04/24 0700 In: 1550 [I.V.:500; NG/GT:1000; IV Piggyback:50] Out: 750 [Urine:450; Stool:300] Intake/Output this shift: Total I/O In: 180 [I.V.:60; NG/GT:120] Out: -  Nutritional status:    Neurologic Exam: Mental Status: With stimulation eyes open.  Does not fix.  Localizes to pain.  Does not follow commands.  No attempts at speech. Cranial Nerves: II: Discs flat bilaterally; Does not blink to bilateral confrontation.  Pupils equal, round, reactive to light and accommodation III,IV, VI: ptosis not present, extra-ocular motions intact bilaterally V,VII: corneals intact bilaterally Motor: Moves all extremities but appears to move the right less so than the left Sensory: Responds to noxious stimuli throughout   Lab Results: Basic Metabolic Panel:  Recent Labs Lab 03/29/16 0552 03/30/16 0401 03/31/16 0520 04/01/16 0442 04/02/16 0425  NA 135 136 138 140 140  K 4.2 4.4 4.6 4.3 4.1  CL 105 105 106 107 104  CO2 _1 GLUCOSE 83 77 104* 117* 118*  BUN 32* 34* 44* 53* 56*  CREATININE 3.46* 4.12* 4.57* 4.93* 4.69*  CALCIUM 7.9* 8.1* 9.0 8.8* 8.8*  PHOS 5.4* 5.6*  --   --   --     Liver Function Tests:  Recent Labs Lab 03/29/16 0552 03/30/16 0401 04/01/16 0442  AST  --   --  17  ALT  --   --  9*  ALKPHOS  --   --  28*  BILITOT  --   --  0.5  PROT  --   --  6.6  ALBUMIN 2.2* 2.1*  2.3*   No results for input(s): LIPASE, AMYLASE in the last 168 hours. No results for input(s): AMMONIA in the last 168 hours.  CBC:  Recent Labs Lab 03/31/16 0526 04/01/16 0442 04/02/16 0425 04/03/16 0601 04/04/16 0428  WBC 9.1 6.9 8.8 8.2 8.3  HGB 8.5* 7.8* 7.7* 7.2* 7.1*  HCT 27.4* 25.8* 25.1* 22.4* 22.9*  MCV 74.2* 75.8* 73.8* 73.6* 75.4*  PLT 191 178 161 139* 156    Cardiac Enzymes: No results for input(s): CKTOTAL, CKMB, CKMBINDEX, TROPONINI in the last 168 hours.  Lipid Panel: No results for input(s): CHOL, TRIG, HDL, CHOLHDL, VLDL, LDLCALC in the last 168 hours.  CBG:  Recent Labs Lab 04/03/16 2004 04/03/16 2329 04/04/16 0432 04/04/16 0743 04/04/16 1150  GLUCAP 104* 99 100* 71 95    Microbiology: Results for orders placed or performed during the hospital encounter of 03/25/16  Culture, blood (routine x 2)     Status: None   Collection Time: 03/25/16  7:19 AM  Result Value Ref Range Status   Specimen Description BLOOD LEFT FOREARM  Final   Special Requests BOTTLES DRAWN AEROBIC AND ANAEROBIC  1CC  Final   Culture NO GROWTH 5 DAYS  Final   Report Status 03/30/2016 FINAL  Final  Culture, blood (routine x  2)     Status: None   Collection Time: 03/25/16  7:19 AM  Result Value Ref Range Status   Specimen Description BLOOD LEFT HAND  Final   Special Requests BOTTLES DRAWN AEROBIC AND ANAEROBIC  1CC  Final   Culture NO GROWTH 5 DAYS  Final   Report Status 03/30/2016 FINAL  Final  MRSA PCR Screening     Status: Abnormal   Collection Time: 03/25/16 12:42 PM  Result Value Ref Range Status   MRSA by PCR POSITIVE (A) NEGATIVE Final    Comment:        The GeneXpert MRSA Assay (FDA approved for NASAL specimens only), is one component of a comprehensive MRSA colonization surveillance program. It is not intended to diagnose MRSA infection nor to guide or monitor treatment for MRSA infections. CRITICAL RESULT CALLED TO, READ BACK BY AND VERIFIED WITH:   AMELIA BERRY AT 1644 03/25/16 SDR   Urine culture     Status: Abnormal   Collection Time: 03/26/16  1:02 AM  Result Value Ref Range Status   Specimen Description URINE, RANDOM  Final   Special Requests NONE  Final   Culture (A)  Final    20,000 COLONIES/mL ESCHERICHIA COLI Results Called to: AMELIA BERRY, RN  AT 1055 ON 03/28/16 BY CTJ. ESBL-EXTENDED SPECTRUM BETA LACTAMASE-THE ORGANISM IS RESISTANT TO PENICILLINS, CEPHALOSPORINS AND AZTREONAM ACCORDING TO CLSI M100-S15 VOL.[place].    Report Status 03/28/2016 FINAL  Final   Organism ID, Bacteria ESCHERICHIA COLI (A)  Final      Susceptibility   Escherichia coli - MIC*    AMPICILLIN >=32 RESISTANT Resistant     CEFAZOLIN >=64 RESISTANT Resistant     CEFTRIAXONE >=64 RESISTANT Resistant     CIPROFLOXACIN >=4 RESISTANT Resistant     GENTAMICIN <=1 SENSITIVE Sensitive     IMIPENEM <=0.25 SENSITIVE Sensitive     NITROFURANTOIN <=16 SENSITIVE Sensitive     TRIMETH/SULFA >=320 RESISTANT Resistant     AMPICILLIN/SULBACTAM 16 INTERMEDIATE Intermediate     PIP/TAZO <=4 SENSITIVE Sensitive     Extended ESBL POSITIVE Resistant     * 20,000 COLONIES/mL ESCHERICHIA COLI  Culture, respiratory (tracheal aspirate)     Status: None (Preliminary result)   Collection Time: 03/30/16  8:50 AM  Result Value Ref Range Status   Specimen Description TRACHEAL ASPIRATE  Final   Special Requests NONE  Final   Gram Stain FEW WBC SEEN RARE GRAM NEGATIVE RODS   Final   Culture   Final    MODERATE GROWTH METHICILLIN RESISTANT STAPHYLOCOCCUS AUREUS LIGHT GROWTH OCHROBACTRUM ANTHROPI BETA LACTAMASE POSITIVE    Report Status PENDING  Incomplete   Organism ID, Bacteria METHICILLIN RESISTANT STAPHYLOCOCCUS AUREUS  Final      Susceptibility   Methicillin resistant staphylococcus aureus - MIC*    CIPROFLOXACIN <=0.5 SENSITIVE Sensitive     ERYTHROMYCIN >=8 RESISTANT Resistant     GENTAMICIN <=0.5 SENSITIVE Sensitive     OXACILLIN >=4 RESISTANT  Resistant     TETRACYCLINE 2 SENSITIVE Sensitive     VANCOMYCIN 1 SENSITIVE Sensitive     TRIMETH/SULFA >=320 RESISTANT Resistant     CLINDAMYCIN >=8 RESISTANT Resistant     RIFAMPIN <=0.5 SENSITIVE Sensitive     Inducible Clindamycin NEGATIVE Sensitive     * MODERATE GROWTH METHICILLIN RESISTANT STAPHYLOCOCCUS AUREUS    Coagulation Studies: No results for input(s): LABPROT, INR in the last 72 hours.  Imaging: Dg Chest 1 View  04/03/2016  CLINICAL DATA:  Cardiopulmonary arrest.  Respiratory failure EXAM: CHEST 1 VIEW FINDINGS: Endo trachea tube, feeding tube, LEFT RIGHT central venous line unchanged. Stable cardiac silhouette. No effusion no pneumothorax. Mild central venous congestion. No pulmonary edema. IMPRESSION: Stable support apparatus. Central venous congestion.  No interval change. Electronically Signed   By: Suzy Bouchard M.D.   On: 04/03/2016 11:44    Medications:  I have reviewed the patient's current medications. Scheduled: . antiseptic oral rinse  7 mL Mouth Rinse 10 times per day  . budesonide (PULMICORT) nebulizer solution  0.25 mg Nebulization 4 times per day  . chlorhexidine gluconate (SAGE KIT)  15 mL Mouth Rinse BID  . feeding supplement (PRO-STAT SUGAR FREE 64)  30 mL Per Tube QID  . fentaNYL (SUBLIMAZE) injection  50 mcg Intravenous Once  . insulin aspart  0-15 Units Subcutaneous 6 times per day  . ipratropium-albuterol  3 mL Nebulization Q6H  . levothyroxine  37.5 mcg Intravenous Daily  . meropenem (MERREM) IV  500 mg Intravenous Q24H  . pantoprazole (PROTONIX) IV  40 mg Intravenous QHS  . [START ON 04/05/2016] senna-docusate  1 tablet Oral Daily    Assessment/Plan: Agree with current management   LOS: 10 days   Alexis Goodell, MD Neurology [phone] 04/04/2016  12:42 PM

## 2016-04-04 NOTE — Progress Notes (Signed)
Palliative Care Update  I had discussed the consult for Palliative Care on Friday with Dr Mortimer Fries and others caring for the patient including nursing. It seems that the family is already set on their plans for aggressive care and that my involvment is not needed at this time.  I have discussed discontinuing the consult order on Friday -  but it wasn't DCd at that time. Will discontinue this consult order at this time.  If things change and a consult is thought to be something that family would be open to, then please place a new order and call me with an update. I will be glad to help in any way I can, but would not want to make anything worse for this family at this time.    Colleen Can, MD

## 2016-04-04 NOTE — Progress Notes (Signed)
Pharmacy Antibiotic Note  Tasha Long is a 60 y.o. female admitted on 03/25/2016 with respiratory distress.   Pharmacy has been consulted for vancomycin and meropenem for MRSA PNA and ESBL E. Coli UTI. Now with AKI.  Plan: Patient now on IHD with sessions on 4/21 (2 hrs) and 4/22 (2.5 hrs). Vancomycin level 4/23: 46mcg/ml. Target: 15-9mcg/ml. Will continue checking levels and redose when </= 75mcg/ml.   4/24 AM vanc level 19. Recheck tomorrow AM.  Continue meropenem 500mg  IV Q24H to be given after dialysis on HD days.   Height: 5\' 5"  (165.1 cm) Weight: (!) 394 lb 13.5 oz (179.1 kg) IBW/kg (Calculated) : 57  Temp (24hrs), Avg:98.5 F (36.9 C), Min:97.7 F (36.5 C), Max:98.9 F (37.2 C)   Recent Labs Lab 03/29/16 0552  03/30/16 0401 03/31/16 0520 03/31/16 0526 04/01/16 0442  04/02/16 0425 04/03/16 0601 04/04/16 0428  WBC 5.4  --  6.5  --  9.1 6.9  --  8.8 8.2 8.3  CREATININE 3.46*  --  4.12* 4.57*  --  4.93*  --  4.69*  --   --   VANCORANDOM  --   < >  --   --   --   --   < >  --  21 19  < > = values in this interval not displayed.  Estimated Creatinine Clearance: 21.8 mL/min (by C-G formula based on Cr of 4.69).    Allergies  Allergen Reactions  . Codeine Hives  . Sulfa Antibiotics Hives    Antimicrobials this admission: Zosyn 4/14 >> 4/14 vancomycin 4/14 >>  Cefepime 4/14 >> 4/17 Metronidazole 4/14 >> 4/17 Meropenem 4/17 >>  Dose adjustments this admission: Merrem 500 mg IV q24 hours 4/18   Microbiology results: 4/19 TA: MRSA 4/14 BCx: negative UCx: 20k ESBL E coli MRSA PCR: positive  Thank you for allowing pharmacy to be a part of this patient's care.  Khush Pasion S, Pharm.D. Clinical Pharmacist 04/04/2016 5:12 AM

## 2016-04-04 NOTE — Progress Notes (Signed)
PULMONARY / CRITICAL CARE MEDICINE   Name: Tasha Long MRN: [id number] DOB: 1956-03-04    ADMISSION DATE:  03/25/2016  BRIEF HISTORY: 60 year old female in chronically poor health, admitted to the ICU after suffering a brief cardiopulmonary arrest, PEA and 03/25/2016. Patient has past medical history significant for hypertension, hyperlipidemia, COPD, morbid obesity, hypothyroidism. Since admission to the ICU for active problems are: Acute on chronic renal failure Acute respiratory failure requiring mechanical ventilation with multilobar pneumonia ESBL Escherichia coli UTI Anemia Neurologic depressed state  SUBJECTIVE:  No acute issues overnight and no significant neurologic improvement.  Patient opens her eyes spontaneously, and  localises to pain.  Was dialysed on Saturday and will be dialysed today as per the daughter and care nurse, and now on intermittent HD. Remains on full vent support.  Patient required increased amount of fentanyl overnight. MAJOR EVENTS/TEST RESULTS: 04/14 TTE: 0000000, G1 diastrolic dysfcn, mild LA dil, mod RV dil, mild RA Dil. 4 chamber dilation, with mod sys dysfcn and mild diastolic dysfcn w/diffuse hypokinesis 04/14 LE venous US: negative.  03/25/16; abdomen US: negative, but incomplete exam.    INDWELLING DEVICES:: ETT 04/14 >>  R [place] CVL 04/14 >>   MICRO DATA: Urine 04/14 >> Positive for E- Coli Resp 04/14 >> few gram negative rods  Blood 04/14 >> negative.  MRSA PCR positive.   PCT algorithm 04/14: 1.48  ANTIMICROBIALS:  Vanc 04/14 >>  Metronidazole 04/14 >>  Cefepime 04/14 >>  Meropenem 4/19>>  VITAL SIGNS: Temp:  [97.7 F (36.5 C)-98.9 F (37.2 C)] 98.5 F (36.9 C) (04/24 0400) Pulse Rate:  [86-110] 87 (04/24 0700) Resp:  [20-35] 23 (04/24 0700) BP: (129-166)/(43-70) 146/47 mmHg (04/24 0700) SpO2:  [90 %-98 %] 96 % (04/24 0700) FiO2 (%):  [35 %] 35 % (04/24 0600) HEMODYNAMICS: CVP:  [18 mmHg-27 mmHg] 22  mmHg VENTILATOR SETTINGS: Vent Mode:  [-] PRVC FiO2 (%):  [35 %] 35 % Set Rate:  [28 bmp] 28 bmp Vt Set:  [500 mL] 500 mL PEEP:  [5 cmH20] 5 cmH20 Plateau Pressure:  [30 cmH20] 30 cmH20 INTAKE / OUTPUT:  Intake/Output Summary (Last 24 hours) at 04/04/16 0854 Last data filed at 04/04/16 0700  Gross per 24 hour  Intake   1430 ml  Output    750 ml  Net    680 ml    Review of Systems  Unable to perform ROS: intubated    Physical Exam General Appearance: Acutely ill  appearing white female Neuro:Atraumatic, normocephalic,Grimaces to pain HEENT:  Opens eyes spontaneously,Pupils  reacting to light,no JVD appreciated, no discharge noted. Pulmonary: Clear bilaterally, no weezes, crackles, rhonchi noted  Cardiovascular Normal S1,S2. No MRG, RRR  Abdomen Obese, round, active bowel sounds Renal: No costovertebral tenderness  GU: not performed Endocrine: No evident thyromegaly. Skin: warm,, no ecchymosis ,cellulitis present , red Extremities: +3 pitting edema, warm to touch, red GCs<8T  LABS:  CBC  Recent Labs Lab 04/02/16 0425 04/03/16 0601 04/04/16 0428  WBC 8.8 8.2 8.3  HGB 7.7* 7.2* 7.1*  HCT 25.1* 22.4* 22.9*  PLT 161 139* 156   Coag's No results for input(s): APTT, INR in the last 168 hours. BMET  Recent Labs Lab 03/31/16 0520 04/01/16 0442 04/02/16 0425  NA 138 140 140  K 4.6 4.3 4.1  CL 106 107 104  CO2 23 26 27   BUN 44* 53* 56*  CREATININE 4.57* 4.93* 4.69*  GLUCOSE 104* 117* 118*   Electrolytes  Recent Labs Lab 03/29/16 0552 03/30/16  0401 03/31/16 0520 04/01/16 0442 04/02/16 0425  CALCIUM 7.9* 8.1* 9.0 8.8* 8.8*  PHOS 5.4* 5.6*  --   --   --    Sepsis Markers No results for input(s): LATICACIDVEN, PROCALCITON, O2SATVEN in the last 168 hours. ABG  Recent Labs Lab 04/01/16 0900 04/02/16 0935 04/03/16 0900  PHART 7.29* 7.39 7.36  PCO2ART 53* 45 54*  PO2ART 80* 139* 87   Liver Enzymes  Recent Labs Lab 03/29/16 0552  03/30/16 0401 04/01/16 0442  AST  --   --  17  ALT  --   --  9*  ALKPHOS  --   --  28*  BILITOT  --   --  0.5  ALBUMIN 2.2* 2.1* 2.3*   Cardiac Enzymes No results for input(s): TROPONINI, PROBNP in the last 168 hours. Glucose  Recent Labs Lab 04/03/16 1248 04/03/16 1553 04/03/16 2004 04/03/16 2329 04/04/16 0432 04/04/16 0743  GLUCAP 109* 110* 104* 99 100* 92    Imaging Dg Chest 1 View  04/03/2016  CLINICAL DATA:  Cardiopulmonary arrest.  Respiratory failure EXAM: CHEST 1 VIEW FINDINGS: Endo trachea tube, feeding tube, LEFT RIGHT central venous line unchanged. Stable cardiac silhouette. No effusion no pneumothorax. Mild central venous congestion. No pulmonary edema. IMPRESSION: Stable support apparatus. Central venous congestion.  No interval change. Electronically Signed   By: Suzy Bouchard M.D.   On: 04/03/2016 11:44    ASSESSMENT / PLAN: 57 F in chronically very poor health developed abdominal pain and EMS was dispatched. Suffered respiratory arrest and brief cardiac arrest in the transfer from her bed to wheelchair and into ambulance. Intubated and transported to ED. Suffered brief PEA arrest again in ICU shortly after arrival   PULMONARY A: Respiratory arrest COPD OSA Smoker Concern for PE given numerous RFs - no CTA given AKI.  Respiratory acidosis P:  Continue anticoagulation empirically.  Continue vent support, not weaning candidate at this time.  Continue Albuterol, pulmicort Nebs.  Vent adjustment, wean, per protocol  CXR prn and Labs prn  CARDIOVASCULAR A:  Cardiac arrest due to respiratory arrest Hypertension Hyperlipidemia P:  Echo indicative of Four-chamber dilated patient, with moderate left ventricle systolic dysfunction and mild diastolic dysfunction with diffuse hypokinesis  MAP goal > 65 mmHg  RENAL A:  AKI, increased creatinine today.  Hyperkalemia Very poor candidate for HD P:  Monitor I/O, monitor kidney function.   Continue IVF. Nephrology following the patient  CRRT Per nephrology - starting 4/21  GASTROINTESTINAL A:  Morbid obesity Abdominal pain Diarrhea P:  SUP: IV PPI TF protocol FMS  . HEMATOLOGIC A:  Anemia with microcytosis - no overt bleeding P:  DVT px: full dose heparin heparin Monitor CBC intermittently Transfuse per usual guidelines  INFECTIOUS A:  Possible abdominal sepsis Possible PNA Possible LE cellulitis with prior hx of MRSA cellulitis ESBL in the urine P:  Monitor temp, WBC count Micro and abx as above Fo Continue vanc and meropenem for now  ENDOCRINE A:  Suspect insulin resistance though no history of DM Glucose controlled.  Hypothyroidism P:  IV L-thyroxine. Transition to enteral when able SSI - mod scale  NEUROLOGIC A:  Post arrest anoxic encephalopathy ICU associated discomfort P:  RASS goal: -2, -3 PAD protocol Daily WUA CT head was negative for any acute abnormality      Bincy Varughese,AG-ACNP Pulmonary & Critical Care  Pt seen and examined with the NP and agree with assessment and plan. Labs and imaging reviewed, imaging unchanged. Discussed with ICU Rn  and family at bedside. Pt witnessed during the weaning. Per report the patient follows commands for family members.  Pt put on 5/5 x 30 min, however BP increased to 170/70, developed tachy, pt appeared dyspneic and distressed even after increased to 10/5. Explained to family that the patient is not yet ready for extubation, and even trial extubation at this time would not be recommended. The plan is therefore unchanged, will consider trach after 14 days. Family is very concerned about performed a trach on he, I assured family that we will continue daily weaning trials to reassess if patient is ready to be extubated and will try to avoid tracheostomy.   Marda Stalker, M.D.  04/04/2016  [place].  I have personally obtained a history,  examined the patient, evaluated laboratory and imaging results, formulated the assessment and plan and placed orders. The Patient requires high complexity decision making for assessment and support, frequent evaluation and titration of therapies, application of advanced monitoring technologies and extensive interpretation of multiple databases. The patient has critical illness that could lead imminently to failure of 1 or more organ systems and requires the highest level of physician preparedness to intervene.  Critical Care Time devoted to patient care services described in this note is 40 minutes and is exclusive of time spent in procedures.

## 2016-04-05 ENCOUNTER — Inpatient Hospital Stay: Payer: Medicaid Other

## 2016-04-05 LAB — CULTURE, RESPIRATORY W GRAM STAIN

## 2016-04-05 LAB — BASIC METABOLIC PANEL
ANION GAP: 6 (ref 5–15)
BUN: 85 mg/dL — AB (ref 6–20)
CALCIUM: 9.3 mg/dL (ref 8.9–10.3)
CO2: 30 mmol/L (ref 22–32)
Chloride: 104 mmol/L (ref 101–111)
Creatinine, Ser: 5.03 mg/dL — ABNORMAL HIGH (ref 0.44–1.00)
GFR calc Af Amer: 10 mL/min — ABNORMAL LOW (ref >60)
GFR calc non Af Amer: 9 mL/min — ABNORMAL LOW (ref >60)
GLUCOSE: 103 mg/dL — AB (ref 65–99)
Potassium: 4.2 mmol/L (ref 3.5–5.1)
Sodium: 140 mmol/L (ref 135–145)

## 2016-04-05 LAB — CBC WITH DIFFERENTIAL/PLATELET
BASOS ABS: 0.1 10*3/uL (ref 0–0.1)
Basophils Relative: 2 %
EOS PCT: 3 %
Eosinophils Absolute: 0.3 10*3/uL (ref 0–0.7)
HCT: 22.8 % — ABNORMAL LOW (ref 35.0–47.0)
HEMOGLOBIN: 7 g/dL — AB (ref 12.0–16.0)
LYMPHS ABS: 1.6 10*3/uL (ref 1.0–3.6)
LYMPHS PCT: 19 %
MCH: 22.8 pg — AB (ref 26.0–34.0)
MCHC: 30.6 g/dL — AB (ref 32.0–36.0)
MCV: 74.7 fL — AB (ref 80.0–100.0)
Monocytes Absolute: 0.8 10*3/uL (ref 0.2–0.9)
Monocytes Relative: 9 %
NEUTROS ABS: 5.7 10*3/uL (ref 1.4–6.5)
NEUTROS PCT: 67 %
PLATELETS: 189 10*3/uL (ref 150–440)
RBC: 3.05 MIL/uL — AB (ref 3.80–5.20)
RDW: 19.8 % — AB (ref 11.5–14.5)
WBC: 8.5 10*3/uL (ref 3.6–11.0)

## 2016-04-05 LAB — GLUCOSE, CAPILLARY
GLUCOSE-CAPILLARY: 106 mg/dL — AB (ref 65–99)
GLUCOSE-CAPILLARY: 93 mg/dL (ref 65–99)
GLUCOSE-CAPILLARY: 111 mg/dL — AB (ref 65–99)
Glucose-Capillary: 115 mg/dL — ABNORMAL HIGH (ref 65–99)
Glucose-Capillary: 119 mg/dL — ABNORMAL HIGH (ref 65–99)
Glucose-Capillary: 88 mg/dL (ref 65–99)
Glucose-Capillary: 94 mg/dL (ref 65–99)

## 2016-04-05 LAB — BLOOD GAS, ARTERIAL
ACID-BASE EXCESS: 5.8 mmol/L — AB (ref 0.0–3.0)
Allens test (pass/fail): POSITIVE — AB
Bicarbonate: 31.1 mEq/L — ABNORMAL HIGH (ref 21.0–28.0)
FIO2: 0.4
MECHVT: 500 mL
Mechanical Rate: 28
O2 Saturation: 99.7 %
PEEP/CPAP: 5 cmH2O
PH ART: 7.41 (ref 7.350–7.450)
PO2 ART: 200 mmHg — AB (ref 83.0–108.0)
Patient temperature: 37
RATE: 28 resp/min
pCO2 arterial: 49 mmHg — ABNORMAL HIGH (ref 32.0–48.0)

## 2016-04-05 LAB — RENAL FUNCTION PANEL
ALBUMIN: 2.4 g/dL — AB (ref 3.5–5.0)
ANION GAP: 7 (ref 5–15)
BUN: 84 mg/dL — ABNORMAL HIGH (ref 6–20)
CHLORIDE: 103 mmol/L (ref 101–111)
CO2: 30 mmol/L (ref 22–32)
Calcium: 9.3 mg/dL (ref 8.9–10.3)
Creatinine, Ser: 5 mg/dL — ABNORMAL HIGH (ref 0.44–1.00)
GFR, EST AFRICAN AMERICAN: 10 mL/min — AB (ref >60)
GFR, EST NON AFRICAN AMERICAN: 9 mL/min — AB (ref >60)
Glucose, Bld: 104 mg/dL — ABNORMAL HIGH (ref 65–99)
POTASSIUM: 4.2 mmol/L (ref 3.5–5.1)
Phosphorus: 6.5 mg/dL — ABNORMAL HIGH (ref 2.5–4.6)
Sodium: 140 mmol/L (ref 135–145)

## 2016-04-05 LAB — VANCOMYCIN, TROUGH: Vancomycin Tr: 18 ug/mL (ref 10–20)

## 2016-04-05 LAB — HEPARIN LEVEL (UNFRACTIONATED): HEPARIN UNFRACTIONATED: 0.42 [IU]/mL (ref 0.30–0.70)

## 2016-04-05 LAB — CULTURE, RESPIRATORY

## 2016-04-05 MED ORDER — DEXMEDETOMIDINE HCL IN NACL 400 MCG/100ML IV SOLN
0.4000 ug/kg/h | INTRAVENOUS | Status: DC
  Administered 2016-04-05 (×3): 0.4 ug/kg/h via INTRAVENOUS
  Administered 2016-04-06: 0.401 ug/kg/h via INTRAVENOUS
  Administered 2016-04-06 – 2016-04-07 (×6): 0.4 ug/kg/h via INTRAVENOUS
  Administered 2016-04-08 (×2): 0.8 ug/kg/h via INTRAVENOUS
  Administered 2016-04-08: 1.2 ug/kg/h via INTRAVENOUS
  Administered 2016-04-08: 0.6 ug/kg/h via INTRAVENOUS
  Administered 2016-04-08: 0.4 ug/kg/h via INTRAVENOUS
  Administered 2016-04-08: 0.8 ug/kg/h via INTRAVENOUS
  Administered 2016-04-08: 1 ug/kg/h via INTRAVENOUS
  Administered 2016-04-08: 0.8 ug/kg/h via INTRAVENOUS
  Administered 2016-04-09 (×2): 1 ug/kg/h via INTRAVENOUS
  Administered 2016-04-09: 1.2 ug/kg/h via INTRAVENOUS
  Administered 2016-04-09: 1 ug/kg/h via INTRAVENOUS
  Administered 2016-04-09: 1.1 ug/kg/h via INTRAVENOUS
  Administered 2016-04-09 (×3): 1.2 ug/kg/h via INTRAVENOUS
  Administered 2016-04-09: 1.1 ug/kg/h via INTRAVENOUS
  Administered 2016-04-09: 1.2 ug/kg/h via INTRAVENOUS
  Administered 2016-04-10: 0.4 ug/kg/h via INTRAVENOUS
  Administered 2016-04-10: 0.6 ug/kg/h via INTRAVENOUS
  Administered 2016-04-10 (×2): 1 ug/kg/h via INTRAVENOUS
  Filled 2016-04-05 (×36): qty 100

## 2016-04-05 NOTE — Care Management (Signed)
Urine output appears to be stable on low dose lasix drip.  Nephrology will continue to evaluate daily for need of additional dialysis treatments.  Remains intubated and on ventilator suppport

## 2016-04-05 NOTE — Progress Notes (Signed)
Pharmacy Antibiotic Note  Tasha Long is a 60 y.o. female admitted on 03/25/2016 with respiratory distress.   Pharmacy has been consulted for vancomycin and meropenem for MRSA PNA and ESBL E. Coli UTI. Now with AKI.  Patient is currently on day #12 vancomycin and day #9 of meropenem. MD wants a total of 14 days of vancomycin. Patient now with AKI and on intermittent HD. Last HD session on Saturday 4/22.   Plan: Patient now on IHD with sessions on 4/21 (2 hrs) and 4/22 (2.5 hrs). Vancomycin level this morning was 18 mcg/mL. Patient does not need additional dose of vancomycin at this time. Will not order another vancomycin trough at this time since vancomycin scheduled to end on 4/27.   Continue meropenem 500mg  IV Q24H to be given after dialysis on HD days.   Height: 5\' 5"  (165.1 cm) Weight: (!) 396 lb (179.624 kg) IBW/kg (Calculated) : 57  Temp (24hrs), Avg:98.6 F (37 C), Min:97.7 F (36.5 C), Max:99 F (37.2 C)   Recent Labs Lab 03/31/16 0520  04/01/16 0442  04/02/16 0425 04/03/16 0601 04/04/16 0428 04/04/16 1138 04/05/16 0416 04/05/16 0731  WBC  --   < > 6.9  --  8.8 8.2 8.3  --  8.5  --   CREATININE 4.57*  --  4.93*  --  4.69*  --   --  4.74* 5.00*  5.03*  --   VANCOTROUGH  --   --   --   --   --   --   --   --   --  18  VANCORANDOM  --   --   --   < >  --  21 19  --   --   --   < > = values in this interval not displayed.  Estimated Creatinine Clearance: 20.5 mL/min (by C-G formula based on Cr of 5).    Allergies  Allergen Reactions  . Codeine Hives  . Sulfa Antibiotics Hives    Antimicrobials this admission: Zosyn 4/14 >> 4/14 vancomycin 4/14 >>  Cefepime 4/14 >> 4/17 Metronidazole 4/14 >> 4/17 Meropenem 4/17 >>  Dose adjustments this admission: Merrem 500 mg IV q24 hours 4/18   Microbiology results: 4/19 TA: MRSA 4/14 BCx: negative UCx: 20k ESBL E coli MRSA PCR: positive  Thank you for allowing pharmacy to be a part of this patient's  care.  Lenis Noon, Pharm.D. Clinical Pharmacist 04/05/2016 3:29 PM

## 2016-04-05 NOTE — Progress Notes (Signed)
Pt has consistently followed simple commands throughout the night. Pt has rested comfortably, off sedation. [facility] transfer RN called for an update, no bed available at this.

## 2016-04-05 NOTE — Progress Notes (Signed)
PULMONARY / CRITICAL CARE MEDICINE   Name: Tasha Long MRN: [id number] DOB: [birth year]    ADMISSION DATE:  03/25/2016  BRIEF HISTORY: 60 year old female in chronically poor health, admitted to the ICU after suffering a brief cardiopulmonary arrest, PEA and 03/25/2016. Patient has past medical history significant for hypertension, hyperlipidemia, COPD, morbid obesity, hypothyroidism. Since admission to the ICU for active problems are: Acute on chronic renal failure Acute respiratory failure requiring mechanical ventilation with multilobar pneumonia ESBL Escherichia coli UTI Anemia Neurologic depressed state  SUBJECTIVE:  No acute issues overnight and no significant neurologic improvement.  Patient opens her eyes spontaneously, and  localises to pain.  Was dialysed on Saturday and will be dialysed today as per the daughter and care nurse, and now on intermittent HD. Remains on full vent support.  Patient required increased amount of fentanyl overnight.  MAJOR EVENTS/TEST RESULTS: 04/14 TTE: 0000000, G1 diastrolic dysfcn, mild LA dil, mod RV dil, mild RA Dil. 4 chamber dilation, with mod sys dysfcn and mild diastolic dysfcn w/diffuse hypokinesis 04/14 LE venous US: negative.  03/25/16; abdomen US: negative, but incomplete exam.    INDWELLING DEVICES:: ETT 04/14 >>  R [place] CVL 04/14 >>   MICRO DATA: Urine 04/14 >> Positive for E- Coli Resp 04/14 >> few gram negative rods  Sputum 4/19>> positive for MRSA. Blood 04/14 >> negative.  MRSA PCR positive.   PCT algorithm 04/14: 1.48  ANTIMICROBIALS:  Zosyn 4/14 >> 4/14 Cefepime 4/14 >> 4/17 Metronidazole 4/14 >> 4/17 Current vancomycin 4/14 >>  Meropenem 4/17 >>  VITAL SIGNS: Temp:  [96.4 F (35.8 C)-99 F (37.2 C)] 98.6 F (37 C) (04/25 0600) Pulse Rate:  [94-108] 96 (04/25 0600) Resp:  [25-34] 32 (04/25 0600) BP: (104-185)/(49-105) 142/57 mmHg (04/25 0600) SpO2:  [87 %-98 %] 87 % (04/25 0600) FiO2 (%):   [35 %-40 %] 40 % (04/25 0817) Weight:  [396 lb (179.624 kg)] 396 lb (179.624 kg) (04/25 0411) HEMODYNAMICS:   VENTILATOR SETTINGS: Vent Mode:  [-] Spontaneous FiO2 (%):  [35 %-40 %] 40 % Set Rate:  [28 bmp] 28 bmp Vt Set:  [500 mL] 500 mL PEEP:  [5 cmH20] 5 cmH20 Pressure Support:  [5 cmH20-10 cmH20] 5 cmH20 INTAKE / OUTPUT:  Intake/Output Summary (Last 24 hours) at 04/05/16 0836 Last data filed at 04/05/16 0600  Gross per 24 hour  Intake 1489.8 ml  Output   1365 ml  Net  124.8 ml    Review of Systems  Unable to perform ROS: intubated    Physical Exam General Appearance: Acutely ill  appearing white female Neuro:Atraumatic, normocephalic,Grimaces to pain HEENT:  Opens eyes spontaneously,Pupils  reacting to light,no JVD appreciated, no discharge noted.--Performed, cuff leak test this morning, the patient had good cuff leak of greater than 300 mL. Pulmonary: Clear bilaterally, no weezes, crackles, rhonchi noted  Cardiovascular Normal S1,S2. No MRG, RRR  Abdomen Obese, round, active bowel sounds Renal: No costovertebral tenderness  GU: not performed Endocrine: No evident thyromegaly. Skin: warm,, no ecchymosis ,cellulitis present , red Extremities: +3 pitting edema, warm to touch, red GCs<8T  LABS:  CBC  Recent Labs Lab 04/03/16 0601 04/04/16 0428 04/05/16 0416  WBC 8.2 8.3 8.5  HGB 7.2* 7.1* 7.0*  HCT 22.4* 22.9* 22.8*  PLT 139* 156 189   Coag's No results for input(s): APTT, INR in the last 168 hours. BMET  Recent Labs Lab 04/02/16 0425 04/04/16 1138 04/05/16 0416  NA 140 140 140  140  K 4.1 4.0 4.2  4.2  CL 104 102 103  104  CO2 27 28 30  30   BUN 56* 72* 84*  85*  CREATININE 4.69* 4.74* 5.00*  5.03*  GLUCOSE 118* 95 104*  103*   Electrolytes  Recent Labs Lab 03/30/16 0401  04/02/16 0425 04/04/16 1138 04/05/16 0416  CALCIUM 8.1*  < > 8.8* 9.3 9.3  9.3  PHOS 5.6*  --   --  5.5* 6.5*  < > = values in this interval not  displayed. Sepsis Markers No results for input(s): LATICACIDVEN, PROCALCITON, O2SATVEN in the last 168 hours. ABG  Recent Labs Lab 04/02/16 0935 04/03/16 0900 04/05/16 0500  PHART 7.39 7.36 7.41  PCO2ART 45 54* 49*  PO2ART 139* 87 200*   Liver Enzymes  Recent Labs Lab 04/01/16 0442 04/04/16 1138 04/05/16 0416  AST 17  --   --   ALT 9*  --   --   ALKPHOS 28*  --   --   BILITOT 0.5  --   --   ALBUMIN 2.3* 2.4* 2.4*   Cardiac Enzymes No results for input(s): TROPONINI, PROBNP in the last 168 hours. Glucose  Recent Labs Lab 04/04/16 1150 04/04/16 1701 04/04/16 1953 04/05/16 04/05/16 0417 04/05/16 0721  GLUCAP 95 97 103* 94 88 93    Imaging Dg Chest 1 View  04/05/2016  CLINICAL DATA:  Respiratory failure, cardiopulmonary arrest, community-acquired pneumonia, intubated patient, former smoker. EXAM: CHEST 1 VIEW COMPARISON:  Chest x-ray of April 03, 2016 FINDINGS: The lungs are well-expanded. The interstitial markings remain increased but have improved since the previous study. There is no pneumothorax or pleural effusion. The cardiac silhouette remains enlarged. The pulmonary vascularity is prominent centrally but the congestion has decreased. The endotracheal tube tip lies approximately 3.8 cm above the carina. The esophagogastric tube tip projects below the inferior margin of the image. The dual-lumen left internal jugular catheter tip projects over the cavoatrial junction. The right subclavian venous catheter tip projects over the proximal SVC. IMPRESSION: Improved appearance of the pulmonary interstitium suggesting resolving interstitial edema. The support tubes are in stable position. Electronically Signed   By: David  Martinique M.D.   On: 04/05/2016 07:22    ASSESSMENT / PLAN: 60 F in chronically very poor health . Status post  respiratory arrest and brief cardiac arrest in the transfer from her bed to wheelchair and into ambulance. Intubated and transported to ED.  Suffered brief PEA arrest again in ICU shortly after arrival.   PULMONARY A: Respiratory arrest COPD OSA Smoker Concern for PE given numerous RFs - no CTA given AKI.  Respiratory acidosis-ABG reviewed today 7.41/49/200/31.1, consistent with acute on chronic hypercapnic respiratory failure. Chest x-ray images reviewed today, minimal change from previous image, continued bibasilar atelectasis, adequate position of the life support devices. P:  Continue anticoagulation empirically.  The patient did tolerate some weaning at a pressure support of 10 over 5, and subsequently 5 over 5, after initially not tolerating this pressure. We will therefore try again today with a pressure support trial of 5/5. If the patient continues to tolerate this today and tomorrow can consider trial extubation.   CARDIOVASCULAR A:  Cardiac arrest due to respiratory arrest Hypertension Hyperlipidemia P:  Echo indicative of Four-chamber dilated patient, with moderate left ventricle systolic dysfunction and mild diastolic dysfunction with diffuse hypokinesis  MAP goal > 65 mmHg  RENAL A:  AKI, increased creatinine today.  Hyperkalemia Very poor candidate for HD P:  Monitor I/O, monitor kidney function.  CRRT Per  nephrology - starting 4/21  GASTROINTESTINAL A:  Morbid obesity Abdominal pain Diarrhea P:  SUP: IV PPI TF protocol FMS  . HEMATOLOGIC A:  Anemia with microcytosis - no overt bleeding P:  DVT px: full dose heparin heparin Monitor CBC intermittently Transfuse per usual guidelines, hemoglobin 7 today, will transfuse if it drops any further.  INFECTIOUS A:  Possible abdominal sepsis Possible PNA Possible LE cellulitis with prior hx of MRSA cellulitis ESBL in the urine P:  Monitor temp, WBC count Micro and abx as above Fo Continue vanc and meropenem for now  ENDOCRINE A:  Suspect insulin resistance though no history of DM Glucose continues to be  controlled.  Hypothyroidism P:  IV L-thyroxine. Transition to enteral when able SSI - mod scale  NEUROLOGIC A:  Post arrest anoxic encephalopathy, she appears to be making  improvement in terms of her mental status ICU associated discomfort P:  RASS goal: -2, -3 PAD protocol Daily WUA CT head was negative for any acute abnormality    -Marda Stalker, M.D.  04/05/2016  Critical Care Attestation.  I have personally obtained a history, examined the patient, evaluated laboratory and imaging results, formulated the assessment and plan and placed orders. The Patient requires high complexity decision making for assessment and support, frequent evaluation and titration of therapies, application of advanced monitoring technologies and extensive interpretation of multiple databases. The patient has critical illness that could lead imminently to failure of 1 or more organ systems and requires the highest level of physician preparedness to intervene.  Critical Care Time devoted to patient care services described in this note is 40 minutes and is exclusive of time spent in procedures.

## 2016-04-05 NOTE — Progress Notes (Signed)
ANTICOAGULATION CONSULT NOTE - Initial Consult  Pharmacy Consult for Heparin Indication: pulmonary embolus  Allergies  Allergen Reactions  . Codeine Hives  . Sulfa Antibiotics Hives   Patient Measurements: Height: 5' 5"  (165.1 cm) Weight: (!) 396 lb (179.624 kg) IBW/kg (Calculated) : 57 Heparin Dosing Weight: 104.8 kg  Vital Signs: Temp: 98.6 F (37 C) (04/25 1500) Temp Source: Core (Comment) (04/25 0413) BP: 137/62 mmHg (04/25 1500) Pulse Rate: 78 (04/25 1500)  Labs:  Recent Labs  04/03/16 0601 04/04/16 0428 04/04/16 1138 04/05/16 0416 04/05/16 0731  HGB 7.2* 7.1*  --  7.0*  --   HCT 22.4* 22.9*  --  22.8*  --   PLT 139* 156  --  189  --   HEPARINUNFRC 0.44 0.45  --   --  0.42  CREATININE  --   --  4.74* 5.00*  5.03*  --     Estimated Creatinine Clearance: 20.5 mL/min (by C-G formula based on Cr of 5).   Medical History: History reviewed. No pertinent past medical history.  Medications:  Scheduled:  . antiseptic oral rinse  7 mL Mouth Rinse 10 times per day  . budesonide (PULMICORT) nebulizer solution  0.25 mg Nebulization 4 times per day  . chlorhexidine gluconate (SAGE KIT)  15 mL Mouth Rinse BID  . feeding supplement (PRO-STAT SUGAR FREE 64)  30 mL Per Tube QID  . fentaNYL (SUBLIMAZE) injection  50 mcg Intravenous Once  . insulin aspart  0-15 Units Subcutaneous 6 times per day  . ipratropium-albuterol  3 mL Nebulization Q6H  . levothyroxine  37.5 mcg Intravenous Daily  . meropenem (MERREM) IV  500 mg Intravenous Q24H  . pantoprazole (PROTONIX) IV  40 mg Intravenous QHS  . senna-docusate  1 tablet Oral Daily   Infusions:  . dexmedetomidine 0.4 mcg/kg/hr (04/05/16 0930)  . feeding supplement (VITAL HIGH PROTEIN) 1,000 mL (04/05/16 0700)  . fentaNYL infusion INTRAVENOUS Stopped (03/29/16 0837)  . furosemide (LASIX) infusion 4 mg/hr (04/05/16 0700)  . heparin 2,000 Units/hr (04/05/16 0700)  . midazolam (VERSED) infusion Stopped (03/29/16 0837)  .  norepinephrine Stopped (03/28/16 2331)    Assessment: 60 y/o F admitted with respiratory arrest ordered heparin for possible PE. Per MD, Cannot rule out PE as patient cannot receive contrast due to AKI.   Goal of Therapy:  Heparin level 0.3-0.7 units/ml Monitor platelets by anticoagulation protocol: Yes   Plan:  Heparin level subtherapeutic. 1500 unit IV x 1 bolus and increase rate to 2000 units/hr. Will recheck level in 6 hours.  4/17 at 21:52  HL = 0.56. Will continue current dosing.  Will recheck HL with AM labs.   4/18 AM heparin level 0.46. Continue current regimen. Recheck with CBC tomorrow AM.  4/19 AM heparin level 0.37. Continue current regimen. Recheck with CBC tomorrow AM.   4/20 AM heparin level 0.49. Continue current regimen. Recheck with CBC tomorrow AM.   4/21 AM heparin level 0.50. Continue current regimen. Recheck with CBC tomorrow AM.   4/22 AM heparin level 0.49. Continue current regimen. Recheck with CBC tomorrow AM.   4/23 AM heparin level 0.44. Continue current regimen. Recheck with CBC tomorrow AM.   4/24 AM heparin level 0.45. Continue current regimen. Recheck with CBC tomorrow AM.   4/25 AM heparin level 0.42. Continue current regimen of heparin 2000 units/hr. CBC and heparin level ordered with AM labs tomorrow.   Lenis Noon, PharmD Clinical Pharmacist 04/05/2016,3:14 PM

## 2016-04-05 NOTE — Progress Notes (Signed)
[facility]  ROUNDING NOTE   Subjective:   Last HD was Saturday. UF 1000 cc UOP  940 cc last 24 hrs UOP appears to be increasing with low dose lasix drip + dependent edema    Objective:  Vital signs in last 24 hours:  Temp:  [96.4 F (35.8 C)-99 F (37.2 C)] 98.6 F (37 C) (04/25 1000) Pulse Rate:  [94-112] 98 (04/25 1000) Resp:  [25-34] 33 (04/25 1000) BP: (104-185)/(49-105) 150/58 mmHg (04/25 1000) SpO2:  [80 %-98 %] 93 % (04/25 1000) FiO2 (%):  [35 %-40 %] 40 % (04/25 0817) Weight:  [179.624 kg (396 lb)] 179.624 kg (396 lb) (04/25 0411)  Weight change:  Filed Weights   03/31/16 0500 04/01/16 0641 04/05/16 0411  Weight: 179.5 kg (395 lb 11.6 oz) 179.1 kg (394 lb 13.5 oz) 179.624 kg (396 lb)    Intake/Output: I/O last 3 completed shifts: In: 2323.8 [I.V.:783.8; NG/GT:1440; IV Piggyback:100] Out: 1965 [Urine:1240; Stool:725]   Intake/Output this shift:     Physical Exam: General: Critically ill  Head: ETT OGT  Eyes: Eyes open, tracking  Neck: trachea midline  Lungs:  Coarse breath sounds, vent assisted  Heart: Regular rate and rhythm  Abdomen:  Soft, nontender, obese, no bowel sounds  Extremities: + peripheral edema.  Neurologic:  intubated, trying to follow commands  Skin: Erythema bilateral lower extremities  Access: Left IJ vascath 4/17 Dr. Mortimer Fries    Basic Metabolic Panel:  Recent Labs Lab 03/30/16 0401 03/31/16 0520 04/01/16 0442 04/02/16 0425 04/04/16 1138 04/05/16 0416  NA 136 138 140 140 140 140  140  K 4.4 4.6 4.3 4.1 4.0 4.2  4.2  CL 105 106 107 104 102 103  104  CO2 22 23 26 27 28 30  30   GLUCOSE 77 104* 117* 118* 95 104*  103*  BUN 34* 44* 53* 56* 72* 84*  85*  CREATININE 4.12* 4.57* 4.93* 4.69* 4.74* 5.00*  5.03*  CALCIUM 8.1* 9.0 8.8* 8.8* 9.3 9.3  9.3  PHOS 5.6*  --   --   --  5.5* 6.5*    Liver Function Tests:  Recent Labs Lab 03/30/16 0401 04/01/16 0442 04/04/16 1138 04/05/16 0416  AST  --  17  --    --   ALT  --  9*  --   --   ALKPHOS  --  28*  --   --   BILITOT  --  0.5  --   --   PROT  --  6.6  --   --   ALBUMIN 2.1* 2.3* 2.4* 2.4*   No results for input(s): LIPASE, AMYLASE in the last 168 hours. No results for input(s): AMMONIA in the last 168 hours.  CBC:  Recent Labs Lab 04/01/16 0442 04/02/16 0425 04/03/16 0601 04/04/16 0428 04/05/16 0416  WBC 6.9 8.8 8.2 8.3 8.5  NEUTROABS  --   --   --   --  5.7  HGB 7.8* 7.7* 7.2* 7.1* 7.0*  HCT 25.8* 25.1* 22.4* 22.9* 22.8*  MCV 75.8* 73.8* 73.6* 75.4* 74.7*  PLT 178 161 139* 156 189    Cardiac Enzymes: No results for input(s): CKTOTAL, CKMB, CKMBINDEX, TROPONINI in the last 168 hours.  BNP: Invalid input(s): POCBNP  CBG:  Recent Labs Lab 04/04/16 1701 04/04/16 1953 04/05/16 04/05/16 0417 04/05/16 0721  GLUCAP 97 103* 94 88 93    Microbiology: Results for orders placed or performed during the hospital encounter of 03/25/16  Culture, blood (routine x 2)  Status: None   Collection Time: 03/25/16  7:19 AM  Result Value Ref Range Status   Specimen Description BLOOD LEFT FOREARM  Final   Special Requests BOTTLES DRAWN AEROBIC AND ANAEROBIC  1CC  Final   Culture NO GROWTH 5 DAYS  Final   Report Status 03/30/2016 FINAL  Final  Culture, blood (routine x 2)     Status: None   Collection Time: 03/25/16  7:19 AM  Result Value Ref Range Status   Specimen Description BLOOD LEFT HAND  Final   Special Requests BOTTLES DRAWN AEROBIC AND ANAEROBIC  1CC  Final   Culture NO GROWTH 5 DAYS  Final   Report Status 03/30/2016 FINAL  Final  MRSA PCR Screening     Status: Abnormal   Collection Time: 03/25/16 12:42 PM  Result Value Ref Range Status   MRSA by PCR POSITIVE (A) NEGATIVE Final    Comment:        The GeneXpert MRSA Assay (FDA approved for NASAL specimens only), is one component of a comprehensive MRSA colonization surveillance program. It is not intended to diagnose MRSA infection nor to guide or monitor  treatment for MRSA infections. CRITICAL RESULT CALLED TO, READ BACK BY AND VERIFIED WITH:  AMELIA BERRY AT 1644 03/25/16 SDR   Urine culture     Status: Abnormal   Collection Time: 03/26/16  1:02 AM  Result Value Ref Range Status   Specimen Description URINE, RANDOM  Final   Special Requests NONE  Final   Culture (A)  Final    20,000 COLONIES/mL ESCHERICHIA COLI Results Called to: AMELIA BERRY, RN  AT 1055 ON 03/28/16 BY CTJ. ESBL-EXTENDED SPECTRUM BETA LACTAMASE-THE ORGANISM IS RESISTANT TO PENICILLINS, CEPHALOSPORINS AND AZTREONAM ACCORDING TO CLSI M100-S15 VOL.[place].    Report Status 03/28/2016 FINAL  Final   Organism ID, Bacteria ESCHERICHIA COLI (A)  Final      Susceptibility   Escherichia coli - MIC*    AMPICILLIN >=32 RESISTANT Resistant     CEFAZOLIN >=64 RESISTANT Resistant     CEFTRIAXONE >=64 RESISTANT Resistant     CIPROFLOXACIN >=4 RESISTANT Resistant     GENTAMICIN <=1 SENSITIVE Sensitive     IMIPENEM <=0.25 SENSITIVE Sensitive     NITROFURANTOIN <=16 SENSITIVE Sensitive     TRIMETH/SULFA >=320 RESISTANT Resistant     AMPICILLIN/SULBACTAM 16 INTERMEDIATE Intermediate     PIP/TAZO <=4 SENSITIVE Sensitive     Extended ESBL POSITIVE Resistant     * 20,000 COLONIES/mL ESCHERICHIA COLI  Culture, respiratory (tracheal aspirate)     Status: None   Collection Time: 03/30/16  8:50 AM  Result Value Ref Range Status   Specimen Description TRACHEAL ASPIRATE  Final   Special Requests NONE  Final   Gram Stain FEW WBC SEEN RARE GRAM NEGATIVE RODS   Final   Culture   Final    MODERATE GROWTH METHICILLIN RESISTANT STAPHYLOCOCCUS AUREUS LIGHT GROWTH OCHROBACTRUM ANTHROPI MODERATE GROWTH GRAM NEGATIVE RODS UNABLE TO FURTHER ID BETA LACTAMASE POSITIVE    Report Status 04/05/2016 FINAL  Final   Organism ID, Bacteria METHICILLIN RESISTANT STAPHYLOCOCCUS AUREUS  Final      Susceptibility   Methicillin resistant staphylococcus aureus - MIC*    CIPROFLOXACIN <=0.5  SENSITIVE Sensitive     ERYTHROMYCIN >=8 RESISTANT Resistant     GENTAMICIN <=0.5 SENSITIVE Sensitive     OXACILLIN >=4 RESISTANT Resistant     TETRACYCLINE 2 SENSITIVE Sensitive     VANCOMYCIN 1 SENSITIVE Sensitive  TRIMETH/SULFA >=320 RESISTANT Resistant     CLINDAMYCIN >=8 RESISTANT Resistant     RIFAMPIN <=0.5 SENSITIVE Sensitive     Inducible Clindamycin NEGATIVE Sensitive     * MODERATE GROWTH METHICILLIN RESISTANT STAPHYLOCOCCUS AUREUS    Coagulation Studies: No results for input(s): LABPROT, INR in the last 72 hours.  Urinalysis: No results for input(s): COLORURINE, LABSPEC, PHURINE, GLUCOSEU, HGBUR, BILIRUBINUR, KETONESUR, PROTEINUR, UROBILINOGEN, NITRITE, LEUKOCYTESUR in the last 72 hours.  Invalid input(s): APPERANCEUR    Imaging: Dg Chest 1 View  04/05/2016  CLINICAL DATA:  Respiratory failure, cardiopulmonary arrest, community-acquired pneumonia, intubated patient, former smoker. EXAM: CHEST 1 VIEW COMPARISON:  Chest x-ray of April 03, 2016 FINDINGS: The lungs are well-expanded. The interstitial markings remain increased but have improved since the previous study. There is no pneumothorax or pleural effusion. The cardiac silhouette remains enlarged. The pulmonary vascularity is prominent centrally but the congestion has decreased. The endotracheal tube tip lies approximately 3.8 cm above the carina. The esophagogastric tube tip projects below the inferior margin of the image. The dual-lumen left internal jugular catheter tip projects over the cavoatrial junction. The right subclavian venous catheter tip projects over the proximal SVC. IMPRESSION: Improved appearance of the pulmonary interstitium suggesting resolving interstitial edema. The support tubes are in stable position. Electronically Signed   By: David  Martinique M.D.   On: 04/05/2016 07:22   Dg Chest 1 View  04/03/2016  CLINICAL DATA:  Cardiopulmonary arrest.  Respiratory failure EXAM: CHEST 1 VIEW FINDINGS: Endo  trachea tube, feeding tube, LEFT RIGHT central venous line unchanged. Stable cardiac silhouette. No effusion no pneumothorax. Mild central venous congestion. No pulmonary edema. IMPRESSION: Stable support apparatus. Central venous congestion.  No interval change. Electronically Signed   By: Suzy Bouchard M.D.   On: 04/03/2016 11:44     Medications:   . dexmedetomidine 0.4 mcg/kg/hr (04/05/16 0930)  . feeding supplement (VITAL HIGH PROTEIN) 1,000 mL (04/05/16 0700)  . fentaNYL infusion INTRAVENOUS Stopped (03/29/16 0837)  . furosemide (LASIX) infusion 4 mg/hr (04/05/16 0700)  . heparin 2,000 Units/hr (04/05/16 0700)  . midazolam (VERSED) infusion Stopped (03/29/16 0837)  . norepinephrine Stopped (03/28/16 2331)   . antiseptic oral rinse  7 mL Mouth Rinse 10 times per day  . budesonide (PULMICORT) nebulizer solution  0.25 mg Nebulization 4 times per day  . chlorhexidine gluconate (SAGE KIT)  15 mL Mouth Rinse BID  . feeding supplement (PRO-STAT SUGAR FREE 64)  30 mL Per Tube QID  . fentaNYL (SUBLIMAZE) injection  50 mcg Intravenous Once  . insulin aspart  0-15 Units Subcutaneous 6 times per day  . ipratropium-albuterol  3 mL Nebulization Q6H  . levothyroxine  37.5 mcg Intravenous Daily  . meropenem (MERREM) IV  500 mg Intravenous Q24H  . pantoprazole (PROTONIX) IV  40 mg Intravenous QHS  . senna-docusate  1 tablet Oral Daily   sodium chloride, acetaminophen, albuterol, fentaNYL, midazolam, ondansetron (ZOFRAN) IV, sodium chloride flush  Assessment/ Plan:  Ms. Tasha Long is a 60 y.o. 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   1. Acute renal failure on chronic kidney disease stage III  with baseline creatinine of 1.2, eGFR of 49: anuric. Secondary to ATN. With elevated vancomycin levels.    - Evaluate daily for hemodialysis. UOP slightly better,  although BUN higher. Hold HD today - trial of low dose lasix drip - Renally dose all medications. Monitor vanco levels. Avoid nephrotoxic agents.   2. Acute respiratory failure: intubated on mechanical ventilation. with multilobar pneumonia.   3. Sepsis/Hypotension secondary to ESBL E. Coli Urinary tract infection and pneumonia (sputum with MRSA):  no longer on vasopressors.    4. Generalized edema - Lasix drip   LOS: 11 Jayesh Marbach 4/25/201710:13 AM

## 2016-04-06 ENCOUNTER — Inpatient Hospital Stay: Payer: Medicaid Other

## 2016-04-06 LAB — GLUCOSE, CAPILLARY
GLUCOSE-CAPILLARY: 113 mg/dL — AB (ref 65–99)
GLUCOSE-CAPILLARY: 132 mg/dL — AB (ref 65–99)
Glucose-Capillary: 113 mg/dL — ABNORMAL HIGH (ref 65–99)
Glucose-Capillary: 114 mg/dL — ABNORMAL HIGH (ref 65–99)
Glucose-Capillary: 117 mg/dL — ABNORMAL HIGH (ref 65–99)
Glucose-Capillary: 119 mg/dL — ABNORMAL HIGH (ref 65–99)
Glucose-Capillary: 126 mg/dL — ABNORMAL HIGH (ref 65–99)

## 2016-04-06 LAB — BLOOD GAS, ARTERIAL
Acid-Base Excess: 2.2 mmol/L (ref 0.0–3.0)
Allens test (pass/fail): POSITIVE — AB
Bicarbonate: 28.5 meq/L — ABNORMAL HIGH (ref 21.0–28.0)
FIO2: 0.4
MECHVT: 500 mL
Mechanical Rate: 28
O2 Saturation: 97.8 %
PEEP: 5 cmH2O
Patient temperature: 37
pCO2 arterial: 54 mmHg — ABNORMAL HIGH (ref 32.0–48.0)
pH, Arterial: 7.33 — ABNORMAL LOW (ref 7.350–7.450)
pO2, Arterial: 107 mmHg (ref 83.0–108.0)

## 2016-04-06 LAB — BASIC METABOLIC PANEL
ANION GAP: 11 (ref 5–15)
BUN: 98 mg/dL — ABNORMAL HIGH (ref 6–20)
CHLORIDE: 101 mmol/L (ref 101–111)
CO2: 27 mmol/L (ref 22–32)
Calcium: 9 mg/dL (ref 8.9–10.3)
Creatinine, Ser: 5.24 mg/dL — ABNORMAL HIGH (ref 0.44–1.00)
GFR calc non Af Amer: 8 mL/min — ABNORMAL LOW (ref >60)
GFR, EST AFRICAN AMERICAN: 10 mL/min — AB (ref >60)
Glucose, Bld: 115 mg/dL — ABNORMAL HIGH (ref 65–99)
Potassium: 4.4 mmol/L (ref 3.5–5.1)
SODIUM: 139 mmol/L (ref 135–145)

## 2016-04-06 LAB — CBC
HCT: 22.1 % — ABNORMAL LOW (ref 35.0–47.0)
HEMOGLOBIN: 6.9 g/dL — AB (ref 12.0–16.0)
MCH: 23.2 pg — AB (ref 26.0–34.0)
MCHC: 31 g/dL — ABNORMAL LOW (ref 32.0–36.0)
MCV: 74.6 fL — ABNORMAL LOW (ref 80.0–100.0)
PLATELETS: 199 10*3/uL (ref 150–440)
RBC: 2.96 MIL/uL — AB (ref 3.80–5.20)
RDW: 19.1 % — ABNORMAL HIGH (ref 11.5–14.5)
WBC: 9 10*3/uL (ref 3.6–11.0)

## 2016-04-06 LAB — ABO/RH: ABO/RH(D): A POS

## 2016-04-06 LAB — HEPARIN LEVEL (UNFRACTIONATED): Heparin Unfractionated: 0.48 IU/mL (ref 0.30–0.70)

## 2016-04-06 LAB — PREPARE RBC (CROSSMATCH)

## 2016-04-06 NOTE — Progress Notes (Signed)
Nutrition Follow-up  DOCUMENTATION CODES:   Morbid obesity  INTERVENTION:  -Recommend continuing current TF regimen at present; continue to assess  NUTRITION DIAGNOSIS:   Inadequate oral intake related to acute illness as evidenced by NPO status.  Being addressed via TF  GOAL:   Provide needs based on ASPEN/SCCM guidelines  MONITOR:   TF tolerance, Vent status, Labs, Skin, I & O's  REASON FOR ASSESSMENT:   Ventilator    ASSESSMENT:    Pt remains on vent support, following some commands, continuing to wean as tolerated; last HD on Saturday 04/02/16; UOP 800 mL in past 24 hours on low dose lasix drip, +dependent edema. Plan is for HD again today with goal UF of 1 L.   Tolerating Vital High Protein at rate of 40 ml/hr, Prostat QID  Diet Order:   NPO  Skin:  Reviewed, no issues; no pressure ulcers  Last BM:  04/06/16 liquid stool via rectal tube   Labs: Creatinine 5.24, BUN 98, Hgb 6.9 (received blood transfusion today)  Glucose Profile:   Recent Labs  04/06/16 04/06/16 0350 04/06/16 0750  GLUCAP 119* 117* 113*   Meds: ss novolog, lasix drip  Height:   Ht Readings from Last 1 Encounters:  03/28/16 5\' 5"  (1.651 m)    Weight:   Wt Readings from Last 1 Encounters:  04/06/16 361 lb (163.749 kg)    Ideal Body Weight:  56.8 kg  BMI:  Body mass index is 60.07 kg/(m^2).  Estimated Nutritional Needs:   Kcal:  1254-1425 kcals (22-25 kcals/kg IBW per ASPEN guidelines for BMI >50)  Protein:  143 g (2.5 g/kg)   Fluid:  >1.7 L  EDUCATION NEEDS:   Education needs no appropriate at this time  [place], [place], [place] [phone] Pager  [phone] Weekend/On-Call Pager

## 2016-04-06 NOTE — Progress Notes (Signed)
Blood transfusion completed.

## 2016-04-06 NOTE — Progress Notes (Signed)
Patient was having increased work of breathing with o2 sats dropping 79-80's.  RN called Joanne Chars, RRT to come to room. Joanne Chars came to room and patient placed back on rate.  PRN fentanyl given to patient.  Daughters at bedside.  Patient now has improved and work of breathing improved with o2 sats normal.

## 2016-04-06 NOTE — Progress Notes (Signed)
Post hd tx 

## 2016-04-06 NOTE — Progress Notes (Addendum)
PULMONARY / CRITICAL CARE MEDICINE   Name: Tasha Long MRN: [id number] DOB: [birth year]    ADMISSION DATE:  03/25/2016  Patient was seen and examined with the nurse practitioner, I agree with the assessment and plan. I again discussed with the family at the bedside that the patient's respiratory status has not been conducive to extubating her. She has failed daily weaning trials with development of significant tachypnea as well, as often, tachycardia and hypertension during the weaning trials. I explained to them that I would like to see her do well for prolonged weaning over the next 3 days in order to confidently say that she could be extubated. -. In the meantime, I once again explained to them, as have previous providers in the past, at we will need to decide on tracheostomy versus DO NOT RESUSCITATE status going forward. If they would like to continue full aggressive treatment. This would  entail a tracheostomy. If they do not want tracheostomy then the patient would need to be extubated with the thought of not reintubating, and making her comfort measures if her respiratory status failed once again. They seemed to indicate that they would not want to give up on her saying repeatedly "I am not ready to give up on momma". They would discuss this further with the patient's husband.   The patient has already failed her weaning trial today, becoming tachypneic, using accessory muscles, and her sats dropped.   I consulted ENT for tracheostomy to be done later this week or early next week.  BRIEF HISTORY: 60 year old female in chronically poor health, admitted to the ICU after suffering a brief cardiopulmonary arrest, PEA and 03/25/2016. Patient has past medical history significant for hypertension, hyperlipidemia, COPD, morbid obesity, hypothyroidism. Since admission to the ICU for active problems are: Acute on chronic renal failure Acute respiratory failure requiring mechanical  ventilation with multilobar pneumonia ESBL Escherichia coli UTI Anemia Neurologic depressed state  SUBJECTIVE:  No acute issues overnight and no significant neurologic improvement.  Patient opens her eyes spontaneously, and follow simple commands.  Will be dialyzed today as per the nephrologist . we will try pressure support ventilation on her as tolerated . MAJOR EVENTS/TEST RESULTS: 04/14 TTE: 0000000, G1 diastrolic dysfcn, mild LA dil, mod RV dil, mild RA Dil. 4 chamber dilation, with mod sys dysfcn and mild diastolic dysfcn w/diffuse hypokinesis 04/14 LE venous US: negative.  03/25/16; abdomen US: negative, but incomplete exam.    INDWELLING DEVICES:: ETT 04/14 >>  R Heath CVL 04/14 >>   MICRO DATA: Urine 04/14 >> Positive for E- Coli Resp 04/14 >> few gram negative rods  Sputum 4/19>> positive for MRSA. Blood 04/14 >> negative.  MRSA PCR positive.   PCT algorithm 04/14: 1.48  ANTIMICROBIALS:  Zosyn 4/14 >> 4/14 Cefepime 4/14 >> 4/17 Metronidazole 4/14 >> 4/17 Current vancomycin 4/14 >>  Meropenem 4/17 >>  VITAL SIGNS: Temp:  [97.2 F (36.2 C)-98.8 F (37.1 C)] 97.3 F (36.3 C) (04/26 1000) Pulse Rate:  [54-78] 69 (04/26 1000) Resp:  [8-32] 28 (04/26 1000) BP: (107-156)/(45-100) 136/59 mmHg (04/26 1000) SpO2:  [96 %-100 %] 97 % (04/26 1132) FiO2 (%):  [40 %] 40 % (04/26 1210) Weight:  [163.749 kg (361 lb)] 163.749 kg (361 lb) (04/26 0500) HEMODYNAMICS:   VENTILATOR SETTINGS: Vent Mode:  [-] PRVC FiO2 (%):  [40 %] 40 % Set Rate:  [28 bmp] 28 bmp Vt Set:  [500 mL] 500 mL PEEP:  [5 cmH20] 5 cmH20 Pressure Support:  [5  cmH20] 5 cmH20 INTAKE / OUTPUT:  Intake/Output Summary (Last 24 hours) at 04/06/16 1230 Last data filed at 04/06/16 1000  Gross per 24 hour  Intake   2179 ml  Output    875 ml  Net   1304 ml    Review of Systems  Unable to perform ROS: intubated    Physical Exam General Appearance: Acutely ill  appearing white  female Neuro:Atraumatic, normocephalic, follows simple commands  HEENT:  Opens eyes spontaneously,Pupils  reacting to light,no JVD appreciated, no discharge noted. Pulmonary: Clear bilaterally, no weezes, crackles, rhonchi noted  Cardiovascular Normal S1,S2. No MRG, RRR  Abdomen Obese, round, active bowel sounds Renal: No costovertebral tenderness  GU: not performed Endocrine: No evident thyromegaly. Skin: warm,, no ecchymosis ,cellulitis present , red Extremities: +3 pitting edema, warm to touch, red   LABS:  CBC  Recent Labs Lab 04/04/16 0428 04/05/16 0416 04/06/16 0516  WBC 8.3 8.5 9.0  HGB 7.1* 7.0* 6.9*  HCT 22.9* 22.8* 22.1*  PLT 156 189 199   Coag's No results for input(s): APTT, INR in the last 168 hours. BMET  Recent Labs Lab 04/04/16 1138 04/05/16 0416 04/06/16 0516  NA 140 140  140 139  K 4.0 4.2  4.2 4.4  CL 102 103  104 101  CO2 28 30  30 27   BUN 72* 84*  85* 98*  CREATININE 4.74* 5.00*  5.03* 5.24*  GLUCOSE 95 104*  103* 115*   Electrolytes  Recent Labs Lab 04/04/16 1138 04/05/16 0416 04/06/16 0516  CALCIUM 9.3 9.3  9.3 9.0  PHOS 5.5* 6.5*  --    Sepsis Markers No results for input(s): LATICACIDVEN, PROCALCITON, O2SATVEN in the last 168 hours. ABG  Recent Labs Lab 04/03/16 0900 04/05/16 0500 04/06/16 0455  PHART 7.36 7.41 7.33*  PCO2ART 54* 49* 54*  PO2ART 87 200* 107   Liver Enzymes  Recent Labs Lab 04/01/16 0442 04/04/16 1138 04/05/16 0416  AST 17  --   --   ALT 9*  --   --   ALKPHOS 28*  --   --   BILITOT 0.5  --   --   ALBUMIN 2.3* 2.4* 2.4*   Cardiac Enzymes No results for input(s): TROPONINI, PROBNP in the last 168 hours. Glucose  Recent Labs Lab 04/05/16 1613 04/05/16 2011 04/06/16 04/06/16 0350 04/06/16 0750 04/06/16 1145  GLUCAP 119* 115* 119* 117* 113* 114*    Imaging Dg Chest 1 View  04/06/2016  CLINICAL DATA:  Cardiopulmonary arrest with resuscitation, sepsis, community-acquired  pneumonia. EXAM: CHEST 1 VIEW COMPARISON:  Portable chest x-ray of April 05, 2016 FINDINGS: The lungs remain well-expanded. The interstitial markings of both lungs are slightly more conspicuous overall today. The cardiac silhouette is enlarged. The central pulmonary vascularity is engorged. There is no alveolar edema all, pleural effusion, or pneumothorax. The endotracheal tube tip lies 3 cm above the carina. The esophagogastric tube tip projects below the inferior margin of the image. The left internal jugular venous catheter tip projects over the cavoatrial junction. The right subclavian venous catheter tip projects over the proximal SVC. IMPRESSION: Slight interval increase in pulmonary interstitial edema. Persistent cardiomegaly and central pulmonary vascular congestion. No pneumonia nor pulmonary edema is observed. Electronically Signed   By: David  Martinique M.D.   On: 04/06/2016 07:56    ASSESSMENT / PLAN: 61 F in chronically very poor health . Status post  respiratory arrest and brief cardiac arrest in the transfer from her bed to wheelchair and into  ambulance. Intubated and transported to ED. Suffered brief PEA arrest again in ICU shortly after arrival.   PULMONARY A: Respiratory arrest COPD OSA Smoker Concern for PE given numerous RFs - no CTA given AKI.  Respiratory acidosis-ABG reviewed today 7.41/49/200/31.1, consistent with acute on chronic hypercapnic respiratory failure. Chest x-ray images reviewed today, minimal change from previous image, continued bibasilar atelectasis, adequate position of the life support devices. P:  Continue anticoagulation empirically.  The patient did tolerate some weaning at a pressure support of 10 over 5, and subsequently 5 over 5, after initially not tolerating this pressure. We will therefore try again today with a pressure support trial of 5/5. If the patient continues to tolerate this today and tomorrow can consider trial  extubation.   CARDIOVASCULAR A:  Cardiac arrest due to respiratory arrest Hypertension Hyperlipidemia P:  Echo indicative of Four-chamber dilated patient, with moderate left ventricle systolic dysfunction and mild diastolic dysfunction with diffuse hypokinesis  MAP goal > 65 mmHg  RENAL A:  AKI, increased creatinine today.  Hyperkalemia Very poor candidate for HD P:  Monitor I/O, monitor kidney function.  CRRT Per nephrology - starting 4/21 CBC,BMET  in the a.m.  GASTROINTESTINAL A:  Morbid obesity Abdominal pain Diarrhea P:  SUP: IV PPI TF protocol FMS  . HEMATOLOGIC A:  Anemia with microcytosis - no overt bleeding P:  DVT px: full dose heparin heparin Monitor CBC intermittently 1 unit of PRBCs transfused    INFECTIOUS A:  Possible abdominal sepsis Possible PNA Possible LE cellulitis with prior hx of MRSA cellulitis ESBL in the urine P:  Monitor fever curve Follow cultures Continue vanc and meropenem    ENDOCRINE A:  Suspect insulin resistance though no history of DM Glucose continues to be controlled.  Hypothyroidism P:  IV L-thyroxine. Transition to enteral when able SSI - mod scale  NEUROLOGIC A:  Post arrest anoxic encephalopathy, she appears to be making  improvement in terms of her mental status ICU associated discomfort P:  RASS goal: -2, -3 PAD protocol Daily WUA CT head was negative for any acute abnormality   Bincy Varughese,AG-ACNP Pulmonary & Critical Care   Critical Care Attestation.  I , Deep Liliauna Santoni have personally obtained a history, examined the patient, evaluated laboratory and imaging results, formulated the assessment and plan and placed orders. The Patient requires high complexity decision making for assessment and support, frequent evaluation and titration of therapies, application of advanced monitoring technologies and extensive interpretation of multiple databases. The  patient has critical illness that could lead imminently to failure of 1 or more organ systems and requires the highest level of physician preparedness to intervene.  Critical Care Time devoted to patient care services described in this note is 35 minutes and is exclusive of time spent in procedures.

## 2016-04-06 NOTE — Progress Notes (Signed)
Hemodialysis start 

## 2016-04-06 NOTE — Progress Notes (Signed)
ANTICOAGULATION CONSULT NOTE - Initial Consult  Pharmacy Consult for Heparin Indication: pulmonary embolus  Allergies  Allergen Reactions  . Codeine Hives  . Sulfa Antibiotics Hives   Patient Measurements: Height: _0  (165.1 cm) Weight: (!) 361 lb (163.749 kg) IBW/kg (Calculated) : 57 Heparin Dosing Weight: 104.8 kg  Vital Signs: Temp: 97.9 F (36.6 C) (04/26 1400) Temp Source: Core (Comment) (04/26 0400) BP: 118/56 mmHg (04/26 1400) Pulse Rate: 62 (04/26 1400)  Labs:  Recent Labs  04/04/16 0428 04/04/16 1138 04/05/16 0416 04/05/16 0731 04/06/16 0516 04/06/16 0711  HGB 7.1*  --  7.0*  --  6.9*  --   HCT 22.9*  --  22.8*  --  22.1*  --   PLT 156  --  189  --  199  --   HEPARINUNFRC 0.45  --   --  0.42  --  0.48  CREATININE  --  4.74* 5.00*  5.03*  --  5.24*  --     Estimated Creatinine Clearance: 18.4 mL/min (by C-G formula based on Cr of 5.24).   Medical History: History reviewed. No pertinent past medical history.  Medications:  Scheduled:  . antiseptic oral rinse  7 mL Mouth Rinse 10 times per day  . budesonide (PULMICORT) nebulizer solution  0.25 mg Nebulization 4 times per day  . chlorhexidine gluconate (SAGE KIT)  15 mL Mouth Rinse BID  . feeding supplement (PRO-STAT SUGAR FREE 64)  30 mL Per Tube QID  . fentaNYL (SUBLIMAZE) injection  50 mcg Intravenous Once  . insulin aspart  0-15 Units Subcutaneous 6 times per day  . ipratropium-albuterol  3 mL Nebulization Q6H  . levothyroxine  37.5 mcg Intravenous Daily  . meropenem (MERREM) IV  500 mg Intravenous Q24H  . pantoprazole (PROTONIX) IV  40 mg Intravenous QHS  . senna-docusate  1 tablet Oral Daily   Infusions:  . dexmedetomidine 0.401 mcg/kg/hr (04/06/16 1216)  . feeding supplement (VITAL HIGH PROTEIN) 1,000 mL (04/06/16 0850)  . fentaNYL infusion INTRAVENOUS Stopped (03/29/16 0837)  . furosemide (LASIX) infusion 4 mg/hr (04/05/16 2000)  . heparin 2,000 Units/hr (04/06/16 0745)  . midazolam  (VERSED) infusion Stopped (03/29/16 0837)  . norepinephrine Stopped (03/28/16 2331)    Assessment: 60 y/o F admitted with respiratory arrest ordered heparin for possible PE. Per MD, Cannot rule out PE as patient cannot receive contrast due to AKI.   Goal of Therapy:  Heparin level 0.3-0.7 units/ml Monitor platelets by anticoagulation protocol: Yes   Plan:  Heparin level subtherapeutic. 1500 unit IV x 1 bolus and increase rate to 2000 units/hr. Will recheck level in 6 hours.  4/17 at 21:52  HL = 0.56. Will continue current dosing.  Will recheck HL with AM labs.   4/18 AM heparin level 0.46. Continue current regimen. Recheck with CBC tomorrow AM.  4/19 AM heparin level 0.37. Continue current regimen. Recheck with CBC tomorrow AM.   4/20 AM heparin level 0.49. Continue current regimen. Recheck with CBC tomorrow AM.   4/21 AM heparin level 0.50. Continue current regimen. Recheck with CBC tomorrow AM.   4/22 AM heparin level 0.49. Continue current regimen. Recheck with CBC tomorrow AM.   4/23 AM heparin level 0.44. Continue current regimen. Recheck with CBC tomorrow AM.   4/24 AM heparin level 0.45. Continue current regimen. Recheck with CBC tomorrow AM.   4/25 AM heparin level 0.42. Continue current regimen of heparin 2000 units/hr. CBC and heparin level ordered with AM labs tomorrow.  4/26 AM heparin  level 0.48. Continue current regimen of heparin 2000 units/hr. Will f/u AM CBC and HL.    Napoleon Form, PharmD Clinical Pharmacist 04/06/2016,3:15 PM

## 2016-04-06 NOTE — Progress Notes (Signed)
Hemodialysis completed. 

## 2016-04-06 NOTE — Progress Notes (Signed)
Pre-hemodialysis 

## 2016-04-06 NOTE — Progress Notes (Signed)
Pre-hd tx 

## 2016-04-06 NOTE — Progress Notes (Signed)
Packed red cell infusion started during dialysis

## 2016-04-06 NOTE — Progress Notes (Signed)
[facility]  ROUNDING NOTE   Subjective:   Last HD was Saturday. UF 1000 cc UOP  800 cc last 24 hrs Continues on low dose lasix drip + dependent edema Following commands this AM    Objective:  Vital signs in last 24 hours:  Temp:  [97.2 F (36.2 C)-98.8 F (37.1 C)] 97.5 F (36.4 C) (04/26 0800) Pulse Rate:  [54-98] 64 (04/26 0800) Resp:  [8-35] 28 (04/26 0700) BP: (107-156)/(45-100) 150/73 mmHg (04/26 0800) SpO2:  [90 %-100 %] 99 % (04/26 0806) FiO2 (%):  [40 %] 40 % (04/26 0806) Weight:  [163.749 kg (361 lb)] 163.749 kg (361 lb) (04/26 0500)  Weight change: -15.876 kg (-35 lb) Filed Weights   04/01/16 0641 04/05/16 0411 04/06/16 0500  Weight: 179.1 kg (394 lb 13.5 oz) 179.624 kg (396 lb) 163.749 kg (361 lb)    Intake/Output: I/O last 3 completed shifts: In: 2791 [I.V.:1251; NG/GT:1440; IV Piggyback:100] Out: 9191 [Urine:1490; Stool:175]   Intake/Output this shift:  Total I/O In: 82 [I.V.:42; NG/GT:40] Out: 75 [Stool:75]  Physical Exam: General: Critically ill  Head: ETT OGT  Eyes: Eyes open, tracking  Neck: trachea midline  Lungs:  Coarse breath sounds, vent assisted  Heart: Regular rate and rhythm  Abdomen:  Soft, nontender, obese, no bowel sounds  Extremities: + peripheral edema.  Neurologic:  intubated, trying to follow commands  Skin: Erythema bilateral lower extremities  Access: Left IJ vascath 4/17 Dr. Mortimer Fries    Basic Metabolic Panel:  Recent Labs Lab 04/01/16 0442 04/02/16 0425 04/04/16 1138 04/05/16 0416 04/06/16 0516  NA 140 140 140 140  140 139  K 4.3 4.1 4.0 4.2  4.2 4.4  CL 107 104 102 103  104 101  CO2 26 27 28 30  30 27   GLUCOSE 117* 118* 95 104*  103* 115*  BUN 53* 56* 72* 84*  85* 98*  CREATININE 4.93* 4.69* 4.74* 5.00*  5.03* 5.24*  CALCIUM 8.8* 8.8* 9.3 9.3  9.3 9.0  PHOS  --   --  5.5* 6.5*  --     Liver Function Tests:  Recent Labs Lab 04/01/16 0442 04/04/16 1138 04/05/16 0416  AST 17  --   --    ALT 9*  --   --   ALKPHOS 28*  --   --   BILITOT 0.5  --   --   PROT 6.6  --   --   ALBUMIN 2.3* 2.4* 2.4*   No results for input(s): LIPASE, AMYLASE in the last 168 hours. No results for input(s): AMMONIA in the last 168 hours.  CBC:  Recent Labs Lab 04/02/16 0425 04/03/16 0601 04/04/16 0428 04/05/16 0416 04/06/16 0516  WBC 8.8 8.2 8.3 8.5 9.0  NEUTROABS  --   --   --  5.7  --   HGB 7.7* 7.2* 7.1* 7.0* 6.9*  HCT 25.1* 22.4* 22.9* 22.8* 22.1*  MCV 73.8* 73.6* 75.4* 74.7* 74.6*  PLT 161 139* 156 189 199    Cardiac Enzymes: No results for input(s): CKTOTAL, CKMB, CKMBINDEX, TROPONINI in the last 168 hours.  BNP: Invalid input(s): POCBNP  CBG:  Recent Labs Lab 04/05/16 1613 04/05/16 2011 04/06/16 04/06/16 0350 04/06/16 0750  GLUCAP 119* 115* 119* 117* 113*    Microbiology: Results for orders placed or performed during the hospital encounter of 03/25/16  Culture, blood (routine x 2)     Status: None   Collection Time: 03/25/16  7:19 AM  Result Value Ref Range Status  Specimen Description BLOOD LEFT FOREARM  Final   Special Requests BOTTLES DRAWN AEROBIC AND ANAEROBIC  1CC  Final   Culture NO GROWTH 5 DAYS  Final   Report Status 03/30/2016 FINAL  Final  Culture, blood (routine x 2)     Status: None   Collection Time: 03/25/16  7:19 AM  Result Value Ref Range Status   Specimen Description BLOOD LEFT HAND  Final   Special Requests BOTTLES DRAWN AEROBIC AND ANAEROBIC  1CC  Final   Culture NO GROWTH 5 DAYS  Final   Report Status 03/30/2016 FINAL  Final  MRSA PCR Screening     Status: Abnormal   Collection Time: 03/25/16 12:42 PM  Result Value Ref Range Status   MRSA by PCR POSITIVE (A) NEGATIVE Final    Comment:        The GeneXpert MRSA Assay (FDA approved for NASAL specimens only), is one component of a comprehensive MRSA colonization surveillance program. It is not intended to diagnose MRSA infection nor to guide or monitor treatment for MRSA  infections. CRITICAL RESULT CALLED TO, READ BACK BY AND VERIFIED WITH:  AMELIA BERRY AT 1644 03/25/16 SDR   Urine culture     Status: Abnormal   Collection Time: 03/26/16  1:02 AM  Result Value Ref Range Status   Specimen Description URINE, RANDOM  Final   Special Requests NONE  Final   Culture (A)  Final    20,000 COLONIES/mL ESCHERICHIA COLI Results Called to: AMELIA BERRY, RN  AT 1055 ON 03/28/16 BY CTJ. ESBL-EXTENDED SPECTRUM BETA LACTAMASE-THE ORGANISM IS RESISTANT TO PENICILLINS, CEPHALOSPORINS AND AZTREONAM ACCORDING TO CLSI M100-S15 VOL.[place].    Report Status 03/28/2016 FINAL  Final   Organism ID, Bacteria ESCHERICHIA COLI (A)  Final      Susceptibility   Escherichia coli - MIC*    AMPICILLIN >=32 RESISTANT Resistant     CEFAZOLIN >=64 RESISTANT Resistant     CEFTRIAXONE >=64 RESISTANT Resistant     CIPROFLOXACIN >=4 RESISTANT Resistant     GENTAMICIN <=1 SENSITIVE Sensitive     IMIPENEM <=0.25 SENSITIVE Sensitive     NITROFURANTOIN <=16 SENSITIVE Sensitive     TRIMETH/SULFA >=320 RESISTANT Resistant     AMPICILLIN/SULBACTAM 16 INTERMEDIATE Intermediate     PIP/TAZO <=4 SENSITIVE Sensitive     Extended ESBL POSITIVE Resistant     * 20,000 COLONIES/mL ESCHERICHIA COLI  Culture, respiratory (tracheal aspirate)     Status: None   Collection Time: 03/30/16  8:50 AM  Result Value Ref Range Status   Specimen Description TRACHEAL ASPIRATE  Final   Special Requests NONE  Final   Gram Stain FEW WBC SEEN RARE GRAM NEGATIVE RODS   Final   Culture   Final    MODERATE GROWTH METHICILLIN RESISTANT STAPHYLOCOCCUS AUREUS LIGHT GROWTH OCHROBACTRUM ANTHROPI MODERATE GROWTH GRAM NEGATIVE RODS UNABLE TO FURTHER ID BETA LACTAMASE POSITIVE    Report Status 04/05/2016 FINAL  Final   Organism ID, Bacteria METHICILLIN RESISTANT STAPHYLOCOCCUS AUREUS  Final      Susceptibility   Methicillin resistant staphylococcus aureus - MIC*    CIPROFLOXACIN <=0.5 SENSITIVE Sensitive      ERYTHROMYCIN >=8 RESISTANT Resistant     GENTAMICIN <=0.5 SENSITIVE Sensitive     OXACILLIN >=4 RESISTANT Resistant     TETRACYCLINE 2 SENSITIVE Sensitive     VANCOMYCIN 1 SENSITIVE Sensitive     TRIMETH/SULFA >=320 RESISTANT Resistant     CLINDAMYCIN >=8 RESISTANT Resistant     RIFAMPIN <=  0.5 SENSITIVE Sensitive     Inducible Clindamycin NEGATIVE Sensitive     * MODERATE GROWTH METHICILLIN RESISTANT STAPHYLOCOCCUS AUREUS    Coagulation Studies: No results for input(s): LABPROT, INR in the last 72 hours.  Urinalysis: No results for input(s): COLORURINE, LABSPEC, PHURINE, GLUCOSEU, HGBUR, BILIRUBINUR, KETONESUR, PROTEINUR, UROBILINOGEN, NITRITE, LEUKOCYTESUR in the last 72 hours.  Invalid input(s): APPERANCEUR    Imaging: Dg Chest 1 View  04/06/2016  CLINICAL DATA:  Cardiopulmonary arrest with resuscitation, sepsis, community-acquired pneumonia. EXAM: CHEST 1 VIEW COMPARISON:  Portable chest x-ray of April 05, 2016 FINDINGS: The lungs remain well-expanded. The interstitial markings of both lungs are slightly more conspicuous overall today. The cardiac silhouette is enlarged. The central pulmonary vascularity is engorged. There is no alveolar edema all, pleural effusion, or pneumothorax. The endotracheal tube tip lies 3 cm above the carina. The esophagogastric tube tip projects below the inferior margin of the image. The left internal jugular venous catheter tip projects over the cavoatrial junction. The right subclavian venous catheter tip projects over the proximal SVC. IMPRESSION: Slight interval increase in pulmonary interstitial edema. Persistent cardiomegaly and central pulmonary vascular congestion. No pneumonia nor pulmonary edema is observed. Electronically Signed   By: David  Martinique M.D.   On: 04/06/2016 07:56   Dg Chest 1 View  04/05/2016  CLINICAL DATA:  Respiratory failure, cardiopulmonary arrest, community-acquired pneumonia, intubated patient, former smoker. EXAM: CHEST 1 VIEW  COMPARISON:  Chest x-ray of April 03, 2016 FINDINGS: The lungs are well-expanded. The interstitial markings remain increased but have improved since the previous study. There is no pneumothorax or pleural effusion. The cardiac silhouette remains enlarged. The pulmonary vascularity is prominent centrally but the congestion has decreased. The endotracheal tube tip lies approximately 3.8 cm above the carina. The esophagogastric tube tip projects below the inferior margin of the image. The dual-lumen left internal jugular catheter tip projects over the cavoatrial junction. The right subclavian venous catheter tip projects over the proximal SVC. IMPRESSION: Improved appearance of the pulmonary interstitium suggesting resolving interstitial edema. The support tubes are in stable position. Electronically Signed   By: David  Martinique M.D.   On: 04/05/2016 07:22     Medications:   . dexmedetomidine 0.4 mcg/kg/hr (04/06/16 0615)  . feeding supplement (VITAL HIGH PROTEIN) 1,000 mL (04/06/16 0850)  . fentaNYL infusion INTRAVENOUS Stopped (03/29/16 0837)  . furosemide (LASIX) infusion 4 mg/hr (04/05/16 2000)  . heparin 2,000 Units/hr (04/06/16 0745)  . midazolam (VERSED) infusion Stopped (03/29/16 0837)  . norepinephrine Stopped (03/28/16 2331)   . antiseptic oral rinse  7 mL Mouth Rinse 10 times per day  . budesonide (PULMICORT) nebulizer solution  0.25 mg Nebulization 4 times per day  . chlorhexidine gluconate (SAGE KIT)  15 mL Mouth Rinse BID  . feeding supplement (PRO-STAT SUGAR FREE 64)  30 mL Per Tube QID  . fentaNYL (SUBLIMAZE) injection  50 mcg Intravenous Once  . insulin aspart  0-15 Units Subcutaneous 6 times per day  . ipratropium-albuterol  3 mL Nebulization Q6H  . levothyroxine  37.5 mcg Intravenous Daily  . meropenem (MERREM) IV  500 mg Intravenous Q24H  . pantoprazole (PROTONIX) IV  40 mg Intravenous QHS  . senna-docusate  1 tablet Oral Daily   sodium chloride, acetaminophen, albuterol,  fentaNYL, midazolam, ondansetron (ZOFRAN) IV, sodium chloride flush  Assessment/ Plan:  Ms. Tasha Long is a 60 y.o. 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   1. Acute renal failure on chronic kidney disease stage III with baseline creatinine of 1.2, eGFR of 49: anuric. Secondary to ATN. With elevated vancomycin levels.    - Evaluate daily for hemodialysis. UOP slightly better, although BUN higher.  Will plan for HD today. 2.5 Hrs. UF goal 1000 cc -continue low dose lasix drip - Renally dose all medications. Monitor vanco levels. Avoid nephrotoxic agents.   2. Acute respiratory failure:  intubated on mechanical ventilation. with multilobar pneumonia.   3. Sepsis/Hypotension secondary to ESBL E. Coli Urinary tract infection and pneumonia (sputum with MRSA):  no longer on vasopressors.    4. Generalized edema - Lasix drip   LOS: 12 Omir Cooprider 4/26/20179:36 AM

## 2016-04-06 NOTE — Progress Notes (Signed)
Pharmacy Antibiotic Note  Tasha Long is a 60 y.o. female admitted on 03/25/2016 with respiratory distress.   Pharmacy has been consulted for vancomycin and meropenem for MRSA PNA and ESBL E. Coli UTI. Now with AKI.  Patient is currently on day #13 vancomycin and day #10 of meropenem. MD wants a total of 14 days of vancomycin. Patient now with AKI and on intermittent HD. Last HD session on Saturday 4/22.   Plan: Patient now on IHD with sessions on 4/21 (2 hrs) and 4/22 (2.5 hrs). Vancomycin level this morning was 18 mcg/mL. Patient does not need additional dose of vancomycin at this time. Will not order another vancomycin trough at this time since vancomycin scheduled to end on 4/27.   Continue meropenem 500mg  IV Q24H to be given after dialysis on HD days.   After discussion with Dr. Ashby Dawes, will d/c abx tomorrow after a total of 14 days.   Height: 5\' 5"  (165.1 cm) Weight: (!) 361 lb (163.749 kg) IBW/kg (Calculated) : 57  Temp (24hrs), Avg:97.9 F (36.6 C), Min:97.2 F (36.2 C), Max:98.6 F (37 C)   Recent Labs Lab 04/01/16 0442  04/02/16 0425 04/03/16 0601 04/04/16 0428 04/04/16 1138 04/05/16 0416 04/05/16 0731 04/06/16 0516  WBC 6.9  --  8.8 8.2 8.3  --  8.5  --  9.0  CREATININE 4.93*  --  4.69*  --   --  4.74* 5.00*  5.03*  --  5.24*  VANCOTROUGH  --   --   --   --   --   --   --  18  --   VANCORANDOM  --   < >  --  21 19  --   --   --   --   < > = values in this interval not displayed.  Estimated Creatinine Clearance: 18.4 mL/min (by C-G formula based on Cr of 5.24).    Allergies  Allergen Reactions  . Codeine Hives  . Sulfa Antibiotics Hives    Antimicrobials this admission: Zosyn 4/14 >> 4/14 vancomycin 4/14 >>  Cefepime 4/14 >> 4/17 Metronidazole 4/14 >> 4/17 Meropenem 4/17 >>  Dose adjustments this admission: Merrem 500 mg IV q24 hours 4/18   Microbiology results: 4/19 TA: MRSA 4/14 BCx: negative UCx: 20k ESBL E coli MRSA PCR:  positive  Thank you for allowing pharmacy to be a part of this patient's care.  Ulice Dash D, Pharm.D. Clinical Pharmacist 04/06/2016 3:22 PM

## 2016-04-07 ENCOUNTER — Inpatient Hospital Stay: Payer: Medicaid Other

## 2016-04-07 LAB — GLUCOSE, CAPILLARY
GLUCOSE-CAPILLARY: 109 mg/dL — AB (ref 65–99)
GLUCOSE-CAPILLARY: 107 mg/dL — AB (ref 65–99)
GLUCOSE-CAPILLARY: 106 mg/dL — AB (ref 65–99)
Glucose-Capillary: 111 mg/dL — ABNORMAL HIGH (ref 65–99)
Glucose-Capillary: 116 mg/dL — ABNORMAL HIGH (ref 65–99)

## 2016-04-07 LAB — BASIC METABOLIC PANEL
ANION GAP: 8 (ref 5–15)
BUN: 91 mg/dL — ABNORMAL HIGH (ref 6–20)
CO2: 30 mmol/L (ref 22–32)
CREATININE: 4.45 mg/dL — AB (ref 0.44–1.00)
Calcium: 8.5 mg/dL — ABNORMAL LOW (ref 8.9–10.3)
Chloride: 104 mmol/L (ref 101–111)
GFR calc non Af Amer: 10 mL/min — ABNORMAL LOW (ref >60)
GFR, EST AFRICAN AMERICAN: 12 mL/min — AB (ref >60)
Glucose, Bld: 121 mg/dL — ABNORMAL HIGH (ref 65–99)
Potassium: 4.1 mmol/L (ref 3.5–5.1)
SODIUM: 142 mmol/L (ref 135–145)

## 2016-04-07 LAB — TYPE AND SCREEN
ABO/RH(D): A POS
Antibody Screen: NEGATIVE
Unit division: 0

## 2016-04-07 LAB — BLOOD GAS, ARTERIAL
Acid-Base Excess: 5.8 mmol/L — ABNORMAL HIGH (ref 0.0–3.0)
BICARBONATE: 31.5 meq/L — AB (ref 21.0–28.0)
FIO2: 0.4
MECHVT: 500 mL
Mechanical Rate: 28
O2 SAT: 98 %
PCO2 ART: 52 mmHg — AB (ref 32.0–48.0)
PEEP/CPAP: 5 cmH2O
PH ART: 7.39 (ref 7.350–7.450)
Patient temperature: 37
pO2, Arterial: 105 mmHg (ref 83.0–108.0)

## 2016-04-07 LAB — CBC
HEMATOCRIT: 23.3 % — AB (ref 35.0–47.0)
HEMOGLOBIN: 7.4 g/dL — AB (ref 12.0–16.0)
MCH: 23.8 pg — ABNORMAL LOW (ref 26.0–34.0)
MCHC: 31.7 g/dL — ABNORMAL LOW (ref 32.0–36.0)
MCV: 75.1 fL — AB (ref 80.0–100.0)
PLATELETS: 212 10*3/uL (ref 150–440)
RBC: 3.11 MIL/uL — AB (ref 3.80–5.20)
RDW: 19.2 % — AB (ref 11.5–14.5)
WBC: 10 10*3/uL (ref 3.6–11.0)

## 2016-04-07 LAB — VANCOMYCIN, RANDOM: VANCOMYCIN RM: 10 ug/mL

## 2016-04-07 LAB — HEPARIN LEVEL (UNFRACTIONATED)
HEPARIN UNFRACTIONATED: 0.26 [IU]/mL — AB (ref 0.30–0.70)
Heparin Unfractionated: 0.33 IU/mL (ref 0.30–0.70)

## 2016-04-07 MED ORDER — VANCOMYCIN HCL IN DEXTROSE 1-5 GM/200ML-% IV SOLN
1000.0000 mg | Freq: Once | INTRAVENOUS | Status: AC
  Administered 2016-04-08: 1000 mg via INTRAVENOUS
  Filled 2016-04-07: qty 200

## 2016-04-07 MED ORDER — SODIUM CHLORIDE 0.9 % IJ SOLN
INTRAMUSCULAR | Status: AC
  Administered 2016-04-07: 12:00:00
  Filled 2016-04-07: qty 10

## 2016-04-07 NOTE — Progress Notes (Signed)
Patient has been tolerating ventilator well over night, following commands, nodding appropriately, and mouthing words to staff and family.  Vital signs have remained stable and urine output has been adequate.  See flowsheet for detailed assessment.  Will continue to monitor.

## 2016-04-07 NOTE — Progress Notes (Signed)
Family would like to talk with physician about tracheostomy placement before signing consent. Consent not signed. Paged Dr. Pryor Ochoa, awaiting call back

## 2016-04-07 NOTE — Consult Note (Signed)
..   Tasha Long, Tasha Long [id number] August 17, 1956  Reason for Consult:  Respiratory failure Requesting Physician:  Wilhelmina Mcardle, MD   HPI:  60 y.o. Female found unresponsive was intubated and has been unable to be weaned from ventilation support.  Concern for possible PE so placed on Heparin drip.  On dialysis currently and unable to have contrast enhanced CT scan.  Family apparently has asked for transfer to [facility] and has been accepted but no bed available and unknown timing on availability.  ROS:  Negative except as in HPI.  History reviewed. No pertinent past medical history.  Allergies  Allergen Reactions  . Codeine Hives  . Sulfa Antibiotics Hives    Scheduled Medications: . antiseptic oral rinse  7 mL Mouth Rinse 10 times per day  . budesonide (PULMICORT) nebulizer solution  0.25 mg Nebulization 4 times per day  . chlorhexidine gluconate (SAGE KIT)  15 mL Mouth Rinse BID  . feeding supplement (PRO-STAT SUGAR FREE 64)  30 mL Per Tube QID  . fentaNYL (SUBLIMAZE) injection  50 mcg Intravenous Once  . insulin aspart  0-15 Units Subcutaneous 6 times per day  . ipratropium-albuterol  3 mL Nebulization Q6H  . levothyroxine  37.5 mcg Intravenous Daily  . meropenem (MERREM) IV  500 mg Intravenous Q24H  . pantoprazole (PROTONIX) IV  40 mg Intravenous QHS  . senna-docusate  1 tablet Oral Daily    PRN Meds: sodium chloride, acetaminophen, albuterol, fentaNYL, midazolam, ondansetron (ZOFRAN) IV, sodium chloride flush  Infusions: . dexmedetomidine 0.4 mcg/kg/hr (04/07/16 1147)  . feeding supplement (VITAL HIGH PROTEIN) 1,000 mL (04/06/16 2000)  . fentaNYL infusion INTRAVENOUS Stopped (03/29/16 0837)  . furosemide (LASIX) infusion 4 mg/hr (04/06/16 2000)  . heparin 2,000 Units/hr (04/07/16 1147)  . midazolam (VERSED) infusion Stopped (03/29/16 0837)  . norepinephrine Stopped (03/28/16 2331)     Physical Exam:  Filed Vitals:   04/07/16 1145 04/07/16 1200  BP: 132/84 147/76   Pulse: 76 75  Temp: 98.4 F (36.9 C) 98.4 F (36.9 C)  Resp: 24 27    PE: GEN- Obese female NAD, supine in bed.  Responds to commands with head nodding NECK- limited thyroid landmarks with short neck  IMPRESSION:  Respiratory failure with failure to wean  PLAN:  Ok to proceed with Lurline Idol on Tuesday May 2nd.  Due to patient's body habitus will be at higher risk of complications.  Due to Heparin, will need to hold 12 hours prior and will be at significantly increased risk of bleeding post-operatively if heparin restarted.  Please hold tube feeds at midnight prior.    Tasha Long 04/07/2016, 12:11 PM

## 2016-04-07 NOTE — Progress Notes (Signed)
Post hd tx 

## 2016-04-07 NOTE — Progress Notes (Signed)
[facility]  ROUNDING NOTE   Subjective:   HD was 4/22, 4/26,  UF 1000 cc , UOP 1200 cc last 24 hrs Continues on low dose lasix drip + dependent edema Following commands this AM    Objective:  Vital signs in last 24 hours:  Temp:  [97.3 F (36.3 C)-99 F (37.2 C)] 98.4 F (36.9 C) (04/27 0900) Pulse Rate:  [28-80] 72 (04/27 0900) Resp:  [20-34] 23 (04/27 0900) BP: (99-173)/(47-91) 136/56 mmHg (04/27 0900) SpO2:  [79 %-100 %] 100 % (04/27 0900) FiO2 (%):  [40 %] 40 % (04/27 0813) Weight:  [163.749 kg (361 lb)-177.356 kg (391 lb)] 177.356 kg (391 lb) (04/27 0459)  Weight change: 0.001 kg (0 oz) Filed Weights   04/06/16 0500 04/06/16 1710 04/07/16 0459  Weight: 163.749 kg (361 lb) 163.749 kg (361 lb) 177.356 kg (391 lb)    Intake/Output: I/O last 3 completed shifts: In: 3790.5 [I.V.:1733.9; Blood:400; NG/GT:1556.7; IV [id number]:563] Out: 2775 [Urine:1700; Other:1000; Stool:75]   Intake/Output this shift:     Physical Exam: General: Critically ill  Head: ETT OGT  Eyes: Eyes open, tracking  Neck: trachea midline  Lungs:  Coarse breath sounds, vent assisted  Heart: Regular rate and rhythm  Abdomen:  Soft, nontender, obese, no bowel sounds  Extremities: + peripheral edema.  Neurologic:  intubated, trying to follow commands  Skin: Erythema bilateral lower extremities  Access: Left IJ vascath 4/17 Dr. Mortimer Fries    Basic Metabolic Panel:  Recent Labs Lab 04/02/16 0425 04/04/16 1138 04/05/16 0416 04/06/16 0516 04/07/16 0505  NA 140 140 140  140 139 142  K 4.1 4.0 4.2  4.2 4.4 4.1  CL 104 102 103  104 101 104  CO2 27 28 30  30 27 30   GLUCOSE 118* 95 104*  103* 115* 121*  BUN 56* 72* 84*  85* 98* 91*  CREATININE 4.69* 4.74* 5.00*  5.03* 5.24* 4.45*  CALCIUM 8.8* 9.3 9.3  9.3 9.0 8.5*  PHOS  --  5.5* 6.5*  --   --     Liver Function Tests:  Recent Labs Lab 04/01/16 0442 04/04/16 1138 04/05/16 0416  AST 17  --   --   ALT 9*  --   --    ALKPHOS 28*  --   --   BILITOT 0.5  --   --   PROT 6.6  --   --   ALBUMIN 2.3* 2.4* 2.4*   No results for input(s): LIPASE, AMYLASE in the last 168 hours. No results for input(s): AMMONIA in the last 168 hours.  CBC:  Recent Labs Lab 04/03/16 0601 04/04/16 0428 04/05/16 0416 04/06/16 0516 04/07/16 0505  WBC 8.2 8.3 8.5 9.0 10.0  NEUTROABS  --   --  5.7  --   --   HGB 7.2* 7.1* 7.0* 6.9* 7.4*  HCT 22.4* 22.9* 22.8* 22.1* 23.3*  MCV 73.6* 75.4* 74.7* 74.6* 75.1*  PLT 139* 156 189 199 212    Cardiac Enzymes: No results for input(s): CKTOTAL, CKMB, CKMBINDEX, TROPONINI in the last 168 hours.  BNP: Invalid input(s): POCBNP  CBG:  Recent Labs Lab 04/06/16 1634 04/06/16 2009 04/06/16 2356 04/07/16 0351 04/07/16 0735  GLUCAP 126* 113* 132* 111* 116*    Microbiology: Results for orders placed or performed during the hospital encounter of 03/25/16  Culture, blood (routine x 2)     Status: None   Collection Time: 03/25/16  7:19 AM  Result Value Ref Range Status   Specimen Description  BLOOD LEFT FOREARM  Final   Special Requests BOTTLES DRAWN AEROBIC AND ANAEROBIC  1CC  Final   Culture NO GROWTH 5 DAYS  Final   Report Status 03/30/2016 FINAL  Final  Culture, blood (routine x 2)     Status: None   Collection Time: 03/25/16  7:19 AM  Result Value Ref Range Status   Specimen Description BLOOD LEFT HAND  Final   Special Requests BOTTLES DRAWN AEROBIC AND ANAEROBIC  1CC  Final   Culture NO GROWTH 5 DAYS  Final   Report Status 03/30/2016 FINAL  Final  MRSA PCR Screening     Status: Abnormal   Collection Time: 03/25/16 12:42 PM  Result Value Ref Range Status   MRSA by PCR POSITIVE (A) NEGATIVE Final    Comment:        The GeneXpert MRSA Assay (FDA approved for NASAL specimens only), is one component of a comprehensive MRSA colonization surveillance program. It is not intended to diagnose MRSA infection nor to guide or monitor treatment for MRSA  infections. CRITICAL RESULT CALLED TO, READ BACK BY AND VERIFIED WITH:  AMELIA BERRY AT 1644 03/25/16 SDR   Urine culture     Status: Abnormal   Collection Time: 03/26/16  1:02 AM  Result Value Ref Range Status   Specimen Description URINE, RANDOM  Final   Special Requests NONE  Final   Culture (A)  Final    20,000 COLONIES/mL ESCHERICHIA COLI Results Called to: AMELIA BERRY, RN  AT 1055 ON 03/28/16 BY CTJ. ESBL-EXTENDED SPECTRUM BETA LACTAMASE-THE ORGANISM IS RESISTANT TO PENICILLINS, CEPHALOSPORINS AND AZTREONAM ACCORDING TO CLSI M100-S15 VOL.Ensign.    Report Status 03/28/2016 FINAL  Final   Organism ID, Bacteria ESCHERICHIA COLI (A)  Final      Susceptibility   Escherichia coli - MIC*    AMPICILLIN >=32 RESISTANT Resistant     CEFAZOLIN >=64 RESISTANT Resistant     CEFTRIAXONE >=64 RESISTANT Resistant     CIPROFLOXACIN >=4 RESISTANT Resistant     GENTAMICIN <=1 SENSITIVE Sensitive     IMIPENEM <=0.25 SENSITIVE Sensitive     NITROFURANTOIN <=16 SENSITIVE Sensitive     TRIMETH/SULFA >=320 RESISTANT Resistant     AMPICILLIN/SULBACTAM 16 INTERMEDIATE Intermediate     PIP/TAZO <=4 SENSITIVE Sensitive     Extended ESBL POSITIVE Resistant     * 20,000 COLONIES/mL ESCHERICHIA COLI  Culture, respiratory (tracheal aspirate)     Status: None   Collection Time: 03/30/16  8:50 AM  Result Value Ref Range Status   Specimen Description TRACHEAL ASPIRATE  Final   Special Requests NONE  Final   Gram Stain FEW WBC SEEN RARE GRAM NEGATIVE RODS   Final   Culture   Final    MODERATE GROWTH METHICILLIN RESISTANT STAPHYLOCOCCUS AUREUS LIGHT GROWTH OCHROBACTRUM ANTHROPI MODERATE GROWTH GRAM NEGATIVE RODS UNABLE TO FURTHER ID BETA LACTAMASE POSITIVE    Report Status 04/05/2016 FINAL  Final   Organism ID, Bacteria METHICILLIN RESISTANT STAPHYLOCOCCUS AUREUS  Final      Susceptibility   Methicillin resistant staphylococcus aureus - MIC*    CIPROFLOXACIN <=0.5 SENSITIVE Sensitive      ERYTHROMYCIN >=8 RESISTANT Resistant     GENTAMICIN <=0.5 SENSITIVE Sensitive     OXACILLIN >=4 RESISTANT Resistant     TETRACYCLINE 2 SENSITIVE Sensitive     VANCOMYCIN 1 SENSITIVE Sensitive     TRIMETH/SULFA >=320 RESISTANT Resistant     CLINDAMYCIN >=8 RESISTANT Resistant     RIFAMPIN <=0.5 SENSITIVE  Sensitive     Inducible Clindamycin NEGATIVE Sensitive     * MODERATE GROWTH METHICILLIN RESISTANT STAPHYLOCOCCUS AUREUS    Coagulation Studies: No results for input(s): LABPROT, INR in the last 72 hours.  Urinalysis: No results for input(s): COLORURINE, LABSPEC, PHURINE, GLUCOSEU, HGBUR, BILIRUBINUR, KETONESUR, PROTEINUR, UROBILINOGEN, NITRITE, LEUKOCYTESUR in the last 72 hours.  Invalid input(s): APPERANCEUR    Imaging: Dg Chest 1 View  04/07/2016  CLINICAL DATA:  Shortness of breath. EXAM: CHEST 1 VIEW COMPARISON:  04/06/2017. FINDINGS: Endotracheal tube, right subclavian, left IJ line, NG tube. Cardiomegaly with increasing interstitial prominence suggesting congestive heart failure. Small left pleural effusion cannot be excluded. No pneumothorax. IMPRESSION: 1.  Lines and tubes in stable position. 2. Congestive heart failure with slight increase in interstitial edema. Small left pleural effusion . Electronically Signed   By: Marcello Moores  Register   On: 04/07/2016 07:00   Dg Chest 1 View  04/06/2016  CLINICAL DATA:  Cardiopulmonary arrest with resuscitation, sepsis, community-acquired pneumonia. EXAM: CHEST 1 VIEW COMPARISON:  Portable chest x-ray of April 05, 2016 FINDINGS: The lungs remain well-expanded. The interstitial markings of both lungs are slightly more conspicuous overall today. The cardiac silhouette is enlarged. The central pulmonary vascularity is engorged. There is no alveolar edema all, pleural effusion, or pneumothorax. The endotracheal tube tip lies 3 cm above the carina. The esophagogastric tube tip projects below the inferior margin of the image. The left internal jugular  venous catheter tip projects over the cavoatrial junction. The right subclavian venous catheter tip projects over the proximal SVC. IMPRESSION: Slight interval increase in pulmonary interstitial edema. Persistent cardiomegaly and central pulmonary vascular congestion. No pneumonia nor pulmonary edema is observed. Electronically Signed   By: David  Martinique M.D.   On: 04/06/2016 07:56     Medications:   . dexmedetomidine 0.4 mcg/kg/hr (04/07/16 0430)  . feeding supplement (VITAL HIGH PROTEIN) 1,000 mL (04/06/16 2000)  . fentaNYL infusion INTRAVENOUS Stopped (03/29/16 0837)  . furosemide (LASIX) infusion 4 mg/hr (04/06/16 2000)  . heparin 2,000 Units/hr (04/06/16 2000)  . midazolam (VERSED) infusion Stopped (03/29/16 0837)  . norepinephrine Stopped (03/28/16 2331)   . antiseptic oral rinse  7 mL Mouth Rinse 10 times per day  . budesonide (PULMICORT) nebulizer solution  0.25 mg Nebulization 4 times per day  . chlorhexidine gluconate (SAGE KIT)  15 mL Mouth Rinse BID  . feeding supplement (PRO-STAT SUGAR FREE 64)  30 mL Per Tube QID  . fentaNYL (SUBLIMAZE) injection  50 mcg Intravenous Once  . insulin aspart  0-15 Units Subcutaneous 6 times per day  . ipratropium-albuterol  3 mL Nebulization Q6H  . levothyroxine  37.5 mcg Intravenous Daily  . meropenem (MERREM) IV  500 mg Intravenous Q24H  . pantoprazole (PROTONIX) IV  40 mg Intravenous QHS  . senna-docusate  1 tablet Oral Daily  . sodium chloride       sodium chloride, acetaminophen, albuterol, fentaNYL, midazolam, ondansetron (ZOFRAN) IV, sodium chloride flush  Assessment/ Plan:  Ms. Tasha Long is a 60 y.o. white female with chronic respiratory failure due to COPD, morbid obesity, obstructive sleep apnea requiring CPAP, chronic severe bilateral lymphedema in the legs, bipolar disorder, anxiety, IBS, GERD, hypertension, hyperlipidemia, hypothyroidism, chronic stasis dermatitis , was admitted on 03/25/2016 with Acute respiratory  distress.   1. Acute renal failure on chronic kidney disease stage III with baseline creatinine of 1.2, eGFR of 49:   Secondary to ATN. With elevated vancomycin levels.    - UOP slightly better,BUN  still high  - Will plan for short HD today. 2.5 Hrs. UF goal 1000 cc - continue low dose lasix drip - Renally dose all medications. Avoid nephrotoxic agents.   2. Acute respiratory failure:  intubated on mechanical ventilation. with multilobar pneumonia.   3. Sepsis/Hypotension secondary to ESBL E. Coli Urinary tract infection and pneumonia (sputum with MRSA):  no longer on vasopressors.    4. Generalized edema - Lasix drip - fluid removal with HD  5. Morbid obesity   LOS: 13 Kamilla Hands 4/27/20179:38 AM

## 2016-04-07 NOTE — Progress Notes (Signed)
Chaplain rounded the unit and provided a compassionate presence and silent prayer with the patient who appeared to be sleeping.  Tasha Long [phone]

## 2016-04-07 NOTE — Care Management (Addendum)
Remains on waiting list for [facility].  May need to consider alternate facility/facilities).  Weaning attempts still unsuccessful. Remains on lasix drip.  Receiving intermittent dialysis.  Duration of this therapy is dependent on renal function. There is order present for ENT consult for trach.  It is discussed during progression that family having trouble dealing with decision about trach and family "requested new attending".  Intensivist service will change 4/28.   Discussed that patient would have to go out of state skilled nursing if unable to wean.  "Strecthcer dialysis would cause additional challenge with discharge plan.   Patient has medicaid and does not qualify for long term acute care.  This may have some influence on how family proceeds with care decisions. Informed that today may not be an optimal time to discuss this possibility with family. Patient is a full code.  Palliative Care input may be of benefit at this time and was able to obtain order from attending.

## 2016-04-07 NOTE — Consult Note (Signed)
WOC wound follow up Wound type:Lymphedema to bilateral lower legs.  Daughter at bedside.  Indicated that she wraps her mother's legs with ace wraps at home.  Has been through hemodialysis and is on Lasix drip.  Will wrap with Unnas boots today to manage excess fluid.  Generalized edema and erythema from above knee to toes.  Measurement:Erythema and edema.  No open areas at this time.  Wound [id number] Drainage (amount, consistency, odor) Minimal serous weeping.  Periwound: Chronic skin changes, elephantiasis, lymphedema Dressing procedure/placement/frequency: Cleanse bilateral lower legs with soap and water.  Apply zinc layer, secured with Coban.  Change twice weekly on Monday and Thursday. [place] team will follow Scarlette Calico RN BSN [facility] Pager [phone]

## 2016-04-07 NOTE — Progress Notes (Signed)
Hemodialysis start 

## 2016-04-07 NOTE — Progress Notes (Signed)
Hemodialysis completed. 

## 2016-04-07 NOTE — Progress Notes (Signed)
Pre-hd tx 

## 2016-04-07 NOTE — Progress Notes (Signed)
Pharmacy Antibiotic Note  Tasha Long is a 60 y.o. female admitted on 03/25/2016 with respiratory distress.   Pharmacy has been consulted for vancomycin and meropenem for MRSA PNA and ESBL E. Coli UTI. Now with AKI.  Plan: Vancomycin random (after dialysis) 10 mcg/mL. Ordered 1 gm IV x 1 supplemental dose. Pharmacy will continue to follow and adjust as needed to maintain level 15 to 25 mcg/mL pre-dialysis.      Height: 5\' 5"  (165.1 cm) Weight: (!) 391 lb (177.356 kg) IBW/kg (Calculated) : 57  Temp (24hrs), Avg:98.5 F (36.9 C), Min:98.2 F (36.8 C), Max:99 F (37.2 C)   Recent Labs Lab 04/02/16 0425 04/03/16 0601 04/04/16 0428 04/04/16 1138 04/05/16 0416 04/05/16 0731 04/06/16 0516 04/07/16 0505 04/07/16 1837  WBC 8.8 8.2 8.3  --  8.5  --  9.0 10.0  --   CREATININE 4.69*  --   --  4.74* 5.00*  5.03*  --  5.24* 4.45*  --   VANCOTROUGH  --   --   --   --   --  18  --   --   --   VANCORANDOM  --  21 19  --   --   --   --   --  10    Estimated Creatinine Clearance: 22.9 mL/min (by C-G formula based on Cr of 4.45).    Allergies  Allergen Reactions  . Codeine Hives  . Sulfa Antibiotics Hives    Antimicrobials this admission: Zosyn 4/14 >> 4/14 vancomycin 4/14 >>  Cefepime 4/14 >> 4/17 Metronidazole 4/14 >> 4/17 Meropenem 4/17 >>  Dose adjustments this admission: Merrem 500 mg IV q24 hours 4/18   Microbiology results: 4/19 TA: MRSA 4/14 BCx: negative UCx: 20k ESBL E coli MRSA PCR: positive  Thank you for allowing pharmacy to be a part of this patient's care.  Laural Benes, Pharm.D., BCPS Clinical Pharmacist 04/07/2016 11:12 PM

## 2016-04-07 NOTE — Progress Notes (Signed)
Pharmacy Antibiotic Note  Tasha Long is a 60 y.o. female admitted on 03/25/2016 with respiratory distress.   Pharmacy has been consulted for vancomycin and meropenem for MRSA PNA and ESBL E. Coli UTI. Now with AKI.  Patient is currently on day #14 vancomycin and day #11 of meropenem. Patient now with AKI and on intermittent HD. Last HD session on Saturday 4/26.   Plan: After discussion with Dr. Ashby Dawes, will continue abx for now.   Last vancomycin level of 18 mcg/ml 4/25 with initial plans to d/c vancomycin after 14 days but now planning to continue. Patient received HD yesterday and is receiving HD again today. Will need to check a level after HD and redose vancomycin if necessary for a goal level of 15-25 mcg/ml.   Continue meropenem 500mg  IV Q24H to be given after dialysis on HD days.      Height: 5\' 5"  (165.1 cm) Weight: (!) 391 lb (177.356 kg) IBW/kg (Calculated) : 57  Temp (24hrs), Avg:98.2 F (36.8 C), Min:97.7 F (36.5 C), Max:99 F (37.2 C)   Recent Labs Lab 04/02/16 0425 04/03/16 0601 04/04/16 0428 04/04/16 1138 04/05/16 0416 04/05/16 0731 04/06/16 0516 04/07/16 0505  WBC 8.8 8.2 8.3  --  8.5  --  9.0 10.0  CREATININE 4.69*  --   --  4.74* 5.00*  5.03*  --  5.24* 4.45*  VANCOTROUGH  --   --   --   --   --  18  --   --   VANCORANDOM  --  21 19  --   --   --   --   --     Estimated Creatinine Clearance: 22.9 mL/min (by C-G formula based on Cr of 4.45).    Allergies  Allergen Reactions  . Codeine Hives  . Sulfa Antibiotics Hives    Antimicrobials this admission: Zosyn 4/14 >> 4/14 vancomycin 4/14 >>  Cefepime 4/14 >> 4/17 Metronidazole 4/14 >> 4/17 Meropenem 4/17 >>  Dose adjustments this admission: Merrem 500 mg IV q24 hours 4/18   Microbiology results: 4/19 TA: MRSA 4/14 BCx: negative UCx: 20k ESBL E coli MRSA PCR: positive  Thank you for allowing pharmacy to be a part of this patient's care.  Ulice Dash D,  Pharm.D. Clinical Pharmacist 04/07/2016 3:13 PM

## 2016-04-07 NOTE — Progress Notes (Signed)
Heparin level was drawn at 22:15.  Earlier level was not resulted according to pharmacy.  Pharmacy needed level in order to titrate drip.  Stat heparin level was then drawn.

## 2016-04-07 NOTE — Progress Notes (Signed)
PULMONARY / CRITICAL CARE MEDICINE   Name: Tasha Long MRN: [id number] DOB: 1956-02-13    ADMISSION DATE:  03/25/2016   Patient was seen and examined with the nurse practitioner, I agree with the assessment and plan. Pt has  failed daily weaning trials on successive days with development of significant tachypnea tachycardia and hypertension during the weaning trials, she would often appear distressed while on precedex drip for anxiety. Consulted ENT for tracheostomy.  BRIEF HISTORY: 60 year old female in chronically poor health, admitted to the ICU after suffering a brief cardiopulmonary arrest, PEA and 03/25/2016. Patient has past medical history significant for hypertension, hyperlipidemia, COPD, morbid obesity, hypothyroidism. Since admission to the ICU for active problems are: Acute on chronic renal failure Acute respiratory failure requiring mechanical ventilation with multilobar pneumonia ESBL Escherichia coli UTI Anemia Neurologic depressed state  SUBJECTIVE:  Patient was dialysed yesterday and were able to take a liter of fluid off of her..  Patient did not have any significant events overnight.  Patient continues to follow simple commands.  Daughter unwilling to make a decision on tracheostomy.  MAJOR EVENTS/TEST RESULTS: 04/14 TTE: 0000000, G1 diastrolic dysfcn, mild LA dil, mod RV dil, mild RA Dil. 4 chamber dilation, with mod sys dysfcn and mild diastolic dysfcn w/diffuse hypokinesis 04/14 LE venous US: negative.  03/25/16; abdomen US: negative, but incomplete exam.    INDWELLING DEVICES:: ETT 04/14 >>  R O'Brien CVL 04/14 >>   MICRO DATA: Urine 04/14 >> Positive for E- Coli Resp 04/14 >> few gram negative rods  Sputum 4/19>> positive for MRSA. Blood 04/14 >> negative.  MRSA PCR positive.   PCT algorithm 04/14: 1.48  ANTIMICROBIALS:  Zosyn 4/14 >> 4/14 Cefepime 4/14 >> 4/17 Metronidazole 4/14 >> 4/17 Current vancomycin 4/14 >>  Meropenem 4/17  >>  VITAL SIGNS: Temp:  [97.7 F (36.5 C)-99 F (37.2 C)] 98.4 F (36.9 C) (04/27 1130) Pulse Rate:  [28-80] 71 (04/27 1130) Resp:  [20-34] 24 (04/27 1130) BP: (99-173)/(47-91) 129/53 mmHg (04/27 1130) SpO2:  [79 %-100 %] 99 % (04/27 1130) FiO2 (%):  [40 %] 40 % (04/27 0813) Weight:  [163.749 kg (361 lb)-177.356 kg (391 lb)] 177.356 kg (391 lb) (04/27 1045) HEMODYNAMICS:   VENTILATOR SETTINGS: Vent Mode:  [-] PRVC FiO2 (%):  [40 %] 40 % Set Rate:  [28 bmp] 28 bmp Vt Set:  [500 mL] 500 mL PEEP:  [5 cmH20] 5 cmH20 INTAKE / OUTPUT:  Intake/Output Summary (Last 24 hours) at 04/07/16 1144 Last data filed at 04/07/16 0700  Gross per 24 hour  Intake 2222.54 ml  Output   2200 ml  Net  22.54 ml    Review of Systems  Unable to perform ROS: intubated    Physical Exam General Appearance: Acutely ill  appearing white female Neuro:Atraumatic, normocephalic, follows simple commands  HEENT:  Opens eyes spontaneously,Pupils  reacting to light,no JVD appreciated, no discharge noted. Pulmonary: Clear bilaterally, no weezes, crackles, rhonchi noted  Cardiovascular Normal S1,S2. No MRG, RRR  Abdomen Obese, round, active bowel sounds Renal: No costovertebral tenderness  GU: not performed Endocrine: No evident thyromegaly. Skin: warm,, no ecchymosis ,cellulitis present , red Extremities: +3 pitting edema, warm to touch, red   LABS:  CBC  Recent Labs Lab 04/05/16 0416 04/06/16 0516 04/07/16 0505  WBC 8.5 9.0 10.0  HGB 7.0* 6.9* 7.4*  HCT 22.8* 22.1* 23.3*  PLT 189 199 212   Coag's No results for input(s): APTT, INR in the last 168 hours. BMET  Recent  Labs Lab 04/05/16 0416 04/06/16 0516 04/07/16 0505  NA 140  140 139 142  K 4.2  4.2 4.4 4.1  CL 103  104 101 104  CO2 30  30 27 30   BUN 84*  85* 98* 91*  CREATININE 5.00*  5.03* 5.24* 4.45*  GLUCOSE 104*  103* 115* 121*   Electrolytes  Recent Labs Lab 04/04/16 1138 04/05/16 0416 04/06/16 0516  04/07/16 0505  CALCIUM 9.3 9.3  9.3 9.0 8.5*  PHOS 5.5* 6.5*  --   --    Sepsis Markers No results for input(s): LATICACIDVEN, PROCALCITON, O2SATVEN in the last 168 hours. ABG  Recent Labs Lab 04/05/16 0500 04/06/16 0455 04/07/16 0400  PHART 7.41 7.33* 7.39  PCO2ART 49* 54* 52*  PO2ART 200* 107 105   Liver Enzymes  Recent Labs Lab 04/01/16 0442 04/04/16 1138 04/05/16 0416  AST 17  --   --   ALT 9*  --   --   ALKPHOS 28*  --   --   BILITOT 0.5  --   --   ALBUMIN 2.3* 2.4* 2.4*   Cardiac Enzymes No results for input(s): TROPONINI, PROBNP in the last 168 hours. Glucose  Recent Labs Lab 04/06/16 1145 04/06/16 1634 04/06/16 2009 04/06/16 2356 04/07/16 0351 04/07/16 0735  GLUCAP 114* 126* 113* 132* 111* 116*    Imaging Dg Chest 1 View  04/07/2016  CLINICAL DATA:  Shortness of breath. EXAM: CHEST 1 VIEW COMPARISON:  04/06/2017. FINDINGS: Endotracheal tube, right subclavian, left IJ line, NG tube. Cardiomegaly with increasing interstitial prominence suggesting congestive heart failure. Small left pleural effusion cannot be excluded. No pneumothorax. IMPRESSION: 1.  Lines and tubes in stable position. 2. Congestive heart failure with slight increase in interstitial edema. Small left pleural effusion . Electronically Signed   By: Marcello Moores  Register   On: 04/07/2016 07:00    ASSESSMENT / PLAN: 7 F in chronically very poor health . Status post  respiratory arrest and brief cardiac arrest in the transfer from her bed to wheelchair and into ambulance. Intubated and transported to ED. Suffered brief PEA arrest again in ICU shortly after arrival.   PULMONARY A: Respiratory arrest COPD OSA Smoker Concern for PE given numerous RFs - no CTA given AKI.   Chest x-ray images reviewed today, minimal change from previous image, continued bibasilar atelectasis,  Mild increase in interstitial edema P:  Patient did not tolerate pressure support ventilation on 4/26, will try  again today  ENT at the bedside for consideration of trach, family unwilling to have a trach up to this point of time On Precedex, fentanyl off at this time Continue heparin infusion empirically, as unable to obtain CT chest CARDIOVASCULAR A:  Cardiac arrest due to respiratory arrest Hypertension Hyperlipidemia P:  Echo indicative of Four-chamber dilated patient, with moderate left ventricle systolic dysfunction and mild diastolic dysfunction with diffuse hypokinesis  MAP goal > 65 mmHg  RENAL A:  AKI, increased creatinine today.  Hyperkalemia Very poor candidate for HD P:  Monitor I/O, monitor kidney function.  CRRT Per nephrology - starting 4/21 Continue Lasix infusion, patient net positive for 14 L since admission  GASTROINTESTINAL A:  Morbid obesity Abdominal pain Diarrhea P:  SUP: IV PPI TF protocol FMS  . HEMATOLOGIC A:  Anemia with microcytosis - no overt bleeding P:  DVT px: full dose heparin heparin Monitor CBC intermittently 1 unit of PRBCs transfused on 4/26-improved H&H   INFECTIOUS A:  Possible abdominal sepsis Possible PNA Possible LE cellulitis  with prior hx of MRSA cellulitis ESBL in the urine P:  Monitor fever curve Follow cultures Continue vanc and meropenem    ENDOCRINE A:  Suspect insulin resistance though no history of DM Glucose continues to be controlled.  Hypothyroidism P:  IV L-thyroxine. Transition to enteral when able SSI - mod scale  NEUROLOGIC A:  Post arrest anoxic encephalopathy, she appears to be making  improvement in terms of her mental status ICU associated discomfort P:  RASS goal: -2, -3 PAD protocol Daily WUA CT head was negative for any acute abnormality    Bincy Varughese,AG-ACNP Pulmonary & Critical Care  Critical Care Attestation.  I have personally obtained a history, examined the patient, evaluated laboratory and imaging results, formulated the assessment and  plan and placed orders. The Patient requires high complexity decision making for assessment and support, frequent evaluation and titration of therapies, application of advanced monitoring technologies and extensive interpretation of multiple databases. The patient has critical illness that could lead imminently to failure of 1 or more organ systems and requires the highest level of physician preparedness to intervene.  Critical Care Time devoted to patient care services described in this note is 35 minutes and is exclusive of time spent in procedures. Marda Stalker, M.D. 04/07/2016

## 2016-04-07 NOTE — Progress Notes (Signed)
Informed that family has requested a new attending physician. As service will be switching tomorrow, this can serve as a "new" attending, otherwise we can consult hospitalist service if needed.

## 2016-04-07 NOTE — Progress Notes (Signed)
ANTICOAGULATION CONSULT NOTE - Initial Consult  Pharmacy Consult for Heparin Indication: pulmonary embolus  Allergies  Allergen Reactions  . Codeine Hives  . Sulfa Antibiotics Hives   Patient Measurements: Height: 5' 5"  (165.1 cm) Weight: (!) 391 lb (177.356 kg) IBW/kg (Calculated) : 57 Heparin Dosing Weight: 104.8 kg  Vital Signs: Temp: 98.4 F (36.9 C) (04/27 1400) Temp Source: Core (Comment) (04/27 0400) BP: 135/54 mmHg (04/27 1400) Pulse Rate: 75 (04/27 1400)  Labs:  Recent Labs  04/05/16 0416 04/05/16 0731 04/06/16 0516 04/06/16 0711 04/07/16 0505 04/07/16 0733  HGB 7.0*  --  6.9*  --  7.4*  --   HCT 22.8*  --  22.1*  --  23.3*  --   PLT 189  --  199  --  212  --   HEPARINUNFRC  --  0.42  --  0.48  --  0.33  CREATININE 5.00*  5.03*  --  5.24*  --  4.45*  --     Estimated Creatinine Clearance: 22.9 mL/min (by C-G formula based on Cr of 4.45).   Medical History: History reviewed. No pertinent past medical history.  Medications:  Scheduled:  . antiseptic oral rinse  7 mL Mouth Rinse 10 times per day  . budesonide (PULMICORT) nebulizer solution  0.25 mg Nebulization 4 times per day  . chlorhexidine gluconate (SAGE KIT)  15 mL Mouth Rinse BID  . feeding supplement (PRO-STAT SUGAR FREE 64)  30 mL Per Tube QID  . fentaNYL (SUBLIMAZE) injection  50 mcg Intravenous Once  . insulin aspart  0-15 Units Subcutaneous 6 times per day  . ipratropium-albuterol  3 mL Nebulization Q6H  . levothyroxine  37.5 mcg Intravenous Daily  . meropenem (MERREM) IV  500 mg Intravenous Q24H  . pantoprazole (PROTONIX) IV  40 mg Intravenous QHS  . senna-docusate  1 tablet Oral Daily   Infusions:  . dexmedetomidine 0.4 mcg/kg/hr (04/07/16 1147)  . feeding supplement (VITAL HIGH PROTEIN) 1,000 mL (04/06/16 2000)  . fentaNYL infusion INTRAVENOUS Stopped (03/29/16 0837)  . furosemide (LASIX) infusion 4 mg/hr (04/06/16 2000)  . heparin 2,000 Units/hr (04/07/16 1147)  . midazolam  (VERSED) infusion Stopped (03/29/16 0837)  . norepinephrine Stopped (03/28/16 2331)    Assessment: 60 y/o F admitted with respiratory arrest ordered heparin for possible PE. Per MD, Cannot rule out PE as patient cannot receive contrast due to AKI.   Goal of Therapy:  Heparin level 0.3-0.7 units/ml Monitor platelets by anticoagulation protocol: Yes   Plan:  Heparin level subtherapeutic. 1500 unit IV x 1 bolus and increase rate to 2000 units/hr. Will recheck level in 6 hours.  4/17 at 21:52  HL = 0.56. Will continue current dosing.  Will recheck HL with AM labs.   4/18 AM heparin level 0.46. Continue current regimen. Recheck with CBC tomorrow AM.  4/19 AM heparin level 0.37. Continue current regimen. Recheck with CBC tomorrow AM.   4/20 AM heparin level 0.49. Continue current regimen. Recheck with CBC tomorrow AM.   4/21 AM heparin level 0.50. Continue current regimen. Recheck with CBC tomorrow AM.   4/22 AM heparin level 0.49. Continue current regimen. Recheck with CBC tomorrow AM.   4/23 AM heparin level 0.44. Continue current regimen. Recheck with CBC tomorrow AM.   4/24 AM heparin level 0.45. Continue current regimen. Recheck with CBC tomorrow AM.   4/25 AM heparin level 0.42. Continue current regimen of heparin 2000 units/hr. CBC and heparin level ordered with AM labs tomorrow.  4/26 AM heparin  level 0.48. Continue current regimen of heparin 2000 units/hr. Will f/u AM CBC and HL.   4/27 AM heparin level 0.33 is therapeutic but decreased somewhat. Due to anemia, will not increase heparin drip at this time but will continue heparin drip at current rate and f/u AM labs.   Napoleon Form, PharmD Clinical Pharmacist 04/07/2016,3:06 PM

## 2016-04-07 NOTE — Progress Notes (Signed)
ANTICOAGULATION CONSULT NOTE - Initial Consult  Pharmacy Consult for Heparin Indication: pulmonary embolus  Allergies  Allergen Reactions  . Codeine Hives  . Sulfa Antibiotics Hives   Patient Measurements: Height: 5' 5"  (165.1 cm) Weight: (!) 391 lb (177.356 kg) IBW/kg (Calculated) : 57 Heparin Dosing Weight: 104.8 kg  Vital Signs: Temp: 98.6 F (37 C) (04/27 1700) Temp Source: Other (Comment) (04/27 2000) BP: 119/59 mmHg (04/27 1700) Pulse Rate: 56 (04/27 1700)  Labs:  Recent Labs  04/05/16 0416  04/06/16 0516 04/06/16 0711 04/07/16 0505 04/07/16 0733 04/07/16 2159  HGB 7.0*  --  6.9*  --  7.4*  --   --   HCT 22.8*  --  22.1*  --  23.3*  --   --   PLT 189  --  199  --  212  --   --   HEPARINUNFRC  --   < >  --  0.48  --  0.33 0.26*  CREATININE 5.00*  5.03*  --  5.24*  --  4.45*  --   --   < > = values in this interval not displayed.  Estimated Creatinine Clearance: 22.9 mL/min (by C-G formula based on Cr of 4.45).   Medical History: History reviewed. No pertinent past medical history.  Medications:  Scheduled:  . antiseptic oral rinse  7 mL Mouth Rinse 10 times per day  . budesonide (PULMICORT) nebulizer solution  0.25 mg Nebulization 4 times per day  . chlorhexidine gluconate (SAGE KIT)  15 mL Mouth Rinse BID  . feeding supplement (PRO-STAT SUGAR FREE 64)  30 mL Per Tube QID  . fentaNYL (SUBLIMAZE) injection  50 mcg Intravenous Once  . insulin aspart  0-15 Units Subcutaneous 6 times per day  . ipratropium-albuterol  3 mL Nebulization Q6H  . levothyroxine  37.5 mcg Intravenous Daily  . meropenem (MERREM) IV  500 mg Intravenous Q24H  . pantoprazole (PROTONIX) IV  40 mg Intravenous QHS  . senna-docusate  1 tablet Oral Daily  . [START ON 04/08/2016] vancomycin  1,000 mg Intravenous Once   Infusions:  . dexmedetomidine 0.4 mcg/kg/hr (04/07/16 2257)  . feeding supplement (VITAL HIGH PROTEIN) 1,000 mL (04/06/16 2000)  . fentaNYL infusion INTRAVENOUS Stopped  (03/29/16 0837)  . furosemide (LASIX) infusion 4 mg/hr (04/06/16 2000)  . heparin 2,000 Units/hr (04/07/16 1147)  . midazolam (VERSED) infusion Stopped (03/29/16 0837)  . norepinephrine Stopped (03/28/16 2331)    Assessment: 60 y/o F admitted with respiratory arrest ordered heparin for possible PE. Per MD, Cannot rule out PE as patient cannot receive contrast due to AKI.   Goal of Therapy:  Heparin level 0.3-0.7 units/ml Monitor platelets by anticoagulation protocol: Yes   Plan:  Heparin level subtherapeutic. 1500 unit IV x 1 bolus and increase rate to 2000 units/hr. Will recheck level in 6 hours.  4/17 at 21:52  HL = 0.56. Will continue current dosing.  Will recheck HL with AM labs.   4/18 AM heparin level 0.46. Continue current regimen. Recheck with CBC tomorrow AM.  4/19 AM heparin level 0.37. Continue current regimen. Recheck with CBC tomorrow AM.   4/20 AM heparin level 0.49. Continue current regimen. Recheck with CBC tomorrow AM.   4/21 AM heparin level 0.50. Continue current regimen. Recheck with CBC tomorrow AM.   4/22 AM heparin level 0.49. Continue current regimen. Recheck with CBC tomorrow AM.   4/23 AM heparin level 0.44. Continue current regimen. Recheck with CBC tomorrow AM.   4/24 AM heparin level 0.45.  Continue current regimen. Recheck with CBC tomorrow AM.   4/25 AM heparin level 0.42. Continue current regimen of heparin 2000 units/hr. CBC and heparin level ordered with AM labs tomorrow.  4/26 AM heparin level 0.48. Continue current regimen of heparin 2000 units/hr. Will f/u AM CBC and HL.   4/27 AM heparin level 0.33 is therapeutic but decreased somewhat. Due to anemia, will not increase heparin drip at this time but will continue heparin drip at current rate and f/u AM labs.   04/27 2159 heparin level subtherapeutic. Increase rate to 2200 units/hr and will recheck level in 8 hours.  Laural Benes, Pharm.D., BCPS Clinical Pharmacist 04/07/2016,11:11  PM

## 2016-04-08 ENCOUNTER — Inpatient Hospital Stay: Payer: Medicaid Other

## 2016-04-08 LAB — BASIC METABOLIC PANEL
ANION GAP: 10 (ref 5–15)
BUN: 80 mg/dL — ABNORMAL HIGH (ref 6–20)
CALCIUM: 8.6 mg/dL — AB (ref 8.9–10.3)
CO2: 28 mmol/L (ref 22–32)
Chloride: 102 mmol/L (ref 101–111)
Creatinine, Ser: 4.01 mg/dL — ABNORMAL HIGH (ref 0.44–1.00)
GFR, EST AFRICAN AMERICAN: 13 mL/min — AB (ref >60)
GFR, EST NON AFRICAN AMERICAN: 11 mL/min — AB (ref >60)
Glucose, Bld: 125 mg/dL — ABNORMAL HIGH (ref 65–99)
POTASSIUM: 4 mmol/L (ref 3.5–5.1)
SODIUM: 140 mmol/L (ref 135–145)

## 2016-04-08 LAB — CBC
HCT: 22.8 % — ABNORMAL LOW (ref 35.0–47.0)
Hemoglobin: 7.2 g/dL — ABNORMAL LOW (ref 12.0–16.0)
MCH: 23.5 pg — ABNORMAL LOW (ref 26.0–34.0)
MCHC: 31.4 g/dL — ABNORMAL LOW (ref 32.0–36.0)
MCV: 75 fL — AB (ref 80.0–100.0)
Platelets: 211 10*3/uL (ref 150–440)
RBC: 3.04 MIL/uL — AB (ref 3.80–5.20)
RDW: 19.1 % — ABNORMAL HIGH (ref 11.5–14.5)
WBC: 8 10*3/uL (ref 3.6–11.0)

## 2016-04-08 LAB — HEPARIN LEVEL (UNFRACTIONATED)
HEPARIN UNFRACTIONATED: 0.48 [IU]/mL (ref 0.30–0.70)
Heparin Unfractionated: 0.29 IU/mL — ABNORMAL LOW (ref 0.30–0.70)

## 2016-04-08 LAB — GLUCOSE, CAPILLARY
GLUCOSE-CAPILLARY: 114 mg/dL — AB (ref 65–99)
GLUCOSE-CAPILLARY: 105 mg/dL — AB (ref 65–99)
Glucose-Capillary: 111 mg/dL — ABNORMAL HIGH (ref 65–99)
Glucose-Capillary: 135 mg/dL — ABNORMAL HIGH (ref 65–99)
Glucose-Capillary: 131 mg/dL — ABNORMAL HIGH (ref 65–99)

## 2016-04-08 MED ORDER — SODIUM CHLORIDE 0.9 % IJ SOLN
INTRAMUSCULAR | Status: AC
  Administered 2016-04-08: 12:00:00
  Filled 2016-04-08: qty 10

## 2016-04-08 MED ORDER — HEPARIN NICU/PEDS BOLUS VIA INFUSION
1400.0000 [IU] | Freq: Once | INTRAVENOUS | Status: AC
  Administered 2016-04-08: 1400 [IU] via INTRAVENOUS
  Filled 2016-04-08: qty 1400

## 2016-04-08 MED ORDER — VANCOMYCIN HCL IN DEXTROSE 1-5 GM/200ML-% IV SOLN
1000.0000 mg | Freq: Once | INTRAVENOUS | Status: AC
  Administered 2016-04-08: 1000 mg via INTRAVENOUS
  Filled 2016-04-08: qty 200

## 2016-04-08 NOTE — Progress Notes (Signed)
[facility]  ROUNDING NOTE   Subjective:   HD was 4/22, 4/26,  UF 1000 cc , UOP 1200 cc last 24 hrs Continues on low dose lasix drip + dependent edema Following commands this AM Remains ventilator dependent    Objective:  Vital signs in last 24 hours:  Temp:  [94.5 F (34.7 C)-98.8 F (37.1 C)] 98.2 F (36.8 C) (04/28 0600) Pulse Rate:  [51-76] 52 (04/28 0600) Resp:  [19-29] 22 (04/28 0400) BP: (110-148)/(46-84) 122/57 mmHg (04/28 0600) SpO2:  [90 %-100 %] 100 % (04/28 0843) FiO2 (%):  [40 %] 40 % (04/28 0843) Weight:  [147.873 kg (326 lb)-177.356 kg (391 lb)] 147.873 kg (326 lb) (04/28 0500)  Weight change: 13.607 kg (30 lb) Filed Weights   04/07/16 0459 04/07/16 1045 04/08/16 0500  Weight: 177.356 kg (391 lb) 177.356 kg (391 lb) 147.873 kg (326 lb)    Intake/Output: I/O last 3 completed shifts: In: 3100.3 [I.V.:2133.7; NG/GT:916.7; IV Piggyback:50] Out: 2775 [Urine:650; Other:2000; Stool:125]   Intake/Output this shift:     Physical Exam: General: Critically ill  Head: ETT OGT  Eyes: Eyes open, tracking  Neck: trachea midline  Lungs:  Coarse breath sounds, vent assisted  Heart: Regular rate and rhythm  Abdomen:  Soft, nontender, obese, no bowel sounds  Extremities: + peripheral edema.  Neurologic:  intubated, trying to follow commands  Skin: Erythema bilateral lower extremities  Access: Left IJ vascath 4/17 Dr. Kasa    Basic Metabolic Panel:  Recent Labs Lab 04/04/16 1138 04/05/16 0416 04/06/16 0516 04/07/16 0505 04/08/16 0539  NA 140 140  140 139 142 140  K 4.0 4.2  4.2 4.4 4.1 4.0  CL 102 103  104 101 104 102  CO2 28 30  30 27 30 28  GLUCOSE 95 104*  103* 115* 121* 125*  BUN 72* 84*  85* 98* 91* 80*  CREATININE 4.74* 5.00*  5.03* 5.24* 4.45* 4.01*  CALCIUM 9.3 9.3  9.3 9.0 8.5* 8.6*  PHOS 5.5* 6.5*  --   --   --     Liver Function Tests:  Recent Labs Lab 04/04/16 1138 04/05/16 0416  ALBUMIN 2.4* 2.4*   No  results for input(s): LIPASE, AMYLASE in the last 168 hours. No results for input(s): AMMONIA in the last 168 hours.  CBC:  Recent Labs Lab 04/04/16 0428 04/05/16 0416 04/06/16 0516 04/07/16 0505 04/08/16 0539  WBC 8.3 8.5 9.0 10.0 8.0  NEUTROABS  --  5.7  --   --   --   HGB 7.1* 7.0* 6.9* 7.4* 7.2*  HCT 22.9* 22.8* 22.1* 23.3* 22.8*  MCV 75.4* 74.7* 74.6* 75.1* 75.0*  PLT 156 189 199 212 211    Cardiac Enzymes: No results for input(s): CKTOTAL, CKMB, CKMBINDEX, TROPONINI in the last 168 hours.  BNP: Invalid input(s): POCBNP  CBG:  Recent Labs Lab 04/07/16 1702 04/07/16 2016 04/08/16 0111 04/08/16 0352 04/08/16 0728  GLUCAP 107* 106* 111* 114* 135*    Microbiology: Results for orders placed or performed during the hospital encounter of 03/25/16  Culture, blood (routine x 2)     Status: None   Collection Time: 03/25/16  7:19 AM  Result Value Ref Range Status   Specimen Description BLOOD LEFT FOREARM  Final   Special Requests BOTTLES DRAWN AEROBIC AND ANAEROBIC  1CC  Final   Culture NO GROWTH 5 DAYS  Final   Report Status 03/30/2016 FINAL  Final  Culture, blood (routine x 2)     Status:   None   Collection Time: 03/25/16  7:19 AM  Result Value Ref Range Status   Specimen Description BLOOD LEFT HAND  Final   Special Requests BOTTLES DRAWN AEROBIC AND ANAEROBIC  1CC  Final   Culture NO GROWTH 5 DAYS  Final   Report Status 03/30/2016 FINAL  Final  MRSA PCR Screening     Status: Abnormal   Collection Time: 03/25/16 12:42 PM  Result Value Ref Range Status   MRSA by PCR POSITIVE (A) NEGATIVE Final    Comment:        The GeneXpert MRSA Assay (FDA approved for NASAL specimens only), is one component of a comprehensive MRSA colonization surveillance program. It is not intended to diagnose MRSA infection nor to guide or monitor treatment for MRSA infections. CRITICAL RESULT CALLED TO, READ BACK BY AND VERIFIED WITH:  AMELIA BERRY AT 1644 03/25/16 SDR   Urine  culture     Status: Abnormal   Collection Time: 03/26/16  1:02 AM  Result Value Ref Range Status   Specimen Description URINE, RANDOM  Final   Special Requests NONE  Final   Culture (A)  Final    20,000 COLONIES/mL ESCHERICHIA COLI Results Called to: AMELIA BERRY, RN  AT 1055 ON 03/28/16 BY CTJ. ESBL-EXTENDED SPECTRUM BETA LACTAMASE-THE ORGANISM IS RESISTANT TO PENICILLINS, CEPHALOSPORINS AND AZTREONAM ACCORDING TO CLSI M100-S15 VOL.[place].    Report Status 03/28/2016 FINAL  Final   Organism ID, Bacteria ESCHERICHIA COLI (A)  Final      Susceptibility   Escherichia coli - MIC*    AMPICILLIN >=32 RESISTANT Resistant     CEFAZOLIN >=64 RESISTANT Resistant     CEFTRIAXONE >=64 RESISTANT Resistant     CIPROFLOXACIN >=4 RESISTANT Resistant     GENTAMICIN <=1 SENSITIVE Sensitive     IMIPENEM <=0.25 SENSITIVE Sensitive     NITROFURANTOIN <=16 SENSITIVE Sensitive     TRIMETH/SULFA >=320 RESISTANT Resistant     AMPICILLIN/SULBACTAM 16 INTERMEDIATE Intermediate     PIP/TAZO <=4 SENSITIVE Sensitive     Extended ESBL POSITIVE Resistant     * 20,000 COLONIES/mL ESCHERICHIA COLI  Culture, respiratory (tracheal aspirate)     Status: None   Collection Time: 03/30/16  8:50 AM  Result Value Ref Range Status   Specimen Description TRACHEAL ASPIRATE  Final   Special Requests NONE  Final   Gram Stain FEW WBC SEEN RARE GRAM NEGATIVE RODS   Final   Culture   Final    MODERATE GROWTH METHICILLIN RESISTANT STAPHYLOCOCCUS AUREUS LIGHT GROWTH OCHROBACTRUM ANTHROPI MODERATE GROWTH GRAM NEGATIVE RODS UNABLE TO FURTHER ID BETA LACTAMASE POSITIVE    Report Status 04/05/2016 FINAL  Final   Organism ID, Bacteria METHICILLIN RESISTANT STAPHYLOCOCCUS AUREUS  Final      Susceptibility   Methicillin resistant staphylococcus aureus - MIC*    CIPROFLOXACIN <=0.5 SENSITIVE Sensitive     ERYTHROMYCIN >=8 RESISTANT Resistant     GENTAMICIN <=0.5 SENSITIVE Sensitive     OXACILLIN >=4 RESISTANT Resistant      TETRACYCLINE 2 SENSITIVE Sensitive     VANCOMYCIN 1 SENSITIVE Sensitive     TRIMETH/SULFA >=320 RESISTANT Resistant     CLINDAMYCIN >=8 RESISTANT Resistant     RIFAMPIN <=0.5 SENSITIVE Sensitive     Inducible Clindamycin NEGATIVE Sensitive     * MODERATE GROWTH METHICILLIN RESISTANT STAPHYLOCOCCUS AUREUS    Coagulation Studies: No results for input(s): LABPROT, INR in the last 72 hours.  Urinalysis: No results for input(s): COLORURINE, LABSPEC, [place], [place],  HGBUR, BILIRUBINUR, KETONESUR, PROTEINUR, UROBILINOGEN, NITRITE, LEUKOCYTESUR in the last 72 hours.  Invalid input(s): APPERANCEUR    Imaging: Dg Chest 1 View  04/08/2016  CLINICAL DATA:  Dyspnea EXAM: CHEST 1 VIEW COMPARISON:  04/07/2016 FINDINGS: Cardiac shadow is stable. A an endotracheal tube, nasogastric catheter, right subclavian central line and left jugular dialysis catheter are again noted and stable. Improved aeration left base is noted. No focal infiltrate is seen. No pneumothorax is noted. IMPRESSION: Tubes and lines as described. No acute abnormality is seen. Electronically Signed   By: Mark  Lukens M.D.   On: 04/08/2016 07:11   Dg Chest 1 View  04/07/2016  CLINICAL DATA:  Shortness of breath. EXAM: CHEST 1 VIEW COMPARISON:  04/06/2017. FINDINGS: Endotracheal tube, right subclavian, left IJ line, NG tube. Cardiomegaly with increasing interstitial prominence suggesting congestive heart failure. Small left pleural effusion cannot be excluded. No pneumothorax. IMPRESSION: 1.  Lines and tubes in stable position. 2. Congestive heart failure with slight increase in interstitial edema. Small left pleural effusion . Electronically Signed   By: Thomas  Register   On: 04/07/2016 07:00     Medications:   . dexmedetomidine 0.6 mcg/kg/hr (04/08/16 0850)  . feeding supplement (VITAL HIGH PROTEIN) 1,000 mL (04/06/16 2000)  . fentaNYL infusion INTRAVENOUS Stopped (03/29/16 0837)  . furosemide (LASIX) infusion 4 mg/hr  (04/08/16 0105)  . heparin 2,200 Units/hr (04/08/16 0059)  . midazolam (VERSED) infusion Stopped (03/29/16 0837)  . norepinephrine Stopped (03/28/16 2331)   . antiseptic oral rinse  7 mL Mouth Rinse 10 times per day  . budesonide (PULMICORT) nebulizer solution  0.25 mg Nebulization 4 times per day  . chlorhexidine gluconate (SAGE KIT)  15 mL Mouth Rinse BID  . feeding supplement (PRO-STAT SUGAR FREE 64)  30 mL Per Tube QID  . fentaNYL (SUBLIMAZE) injection  50 mcg Intravenous Once  . heparin PEDS  1,400 Units Intravenous Once  . insulin aspart  0-15 Units Subcutaneous 6 times per day  . ipratropium-albuterol  3 mL Nebulization Q6H  . levothyroxine  37.5 mcg Intravenous Daily  . meropenem (MERREM) IV  500 mg Intravenous Q24H  . pantoprazole (PROTONIX) IV  40 mg Intravenous QHS  . senna-docusate  1 tablet Oral Daily   sodium chloride, acetaminophen, albuterol, fentaNYL, midazolam, ondansetron (ZOFRAN) IV, sodium chloride flush  Assessment/ Plan:  Tasha Long is a 59 y.o. 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   1. Acute renal failure on chronic kidney disease stage III with baseline creatinine of 1.2, eGFR of 49:   Secondary to ATN. With elevated vancomycin levels.    - UOP poor, BUN still high  - Will plan for short HD today. 3 Hrs. UF goal 0.5-1 kg. Check CVP  - d/c lasix drip - Renally dose all medications. Avoid nephrotoxic agents.   2. Acute respiratory failure:  intubated on mechanical ventilation. with multilobar pneumonia.   3. Sepsis/Hypotension secondary to ESBL E. Coli Urinary tract infection and pneumonia (sputum with MRSA):  no longer on vasopressors.    4.  Generalized edema - fluid removal with HD - Check CVP  5. Morbid obesity    LOS: 14 , 4/28/20179:46 AM   

## 2016-04-08 NOTE — Progress Notes (Signed)
This note also relates to the following rows which could not be included: Temp - Cannot attach notes to unvalidated device data Pulse Rate - Cannot attach notes to unvalidated device data Resp - Cannot attach notes to unvalidated device data BP - Cannot attach notes to unvalidated device data   Pre tx

## 2016-04-08 NOTE — Care Management (Signed)
Spoke with daughters Tasha Long and Tasha Long. They first relay the extent of diagnosis of anxiety in patient and themselves. Tasha Long says that she has been worried almost sick to death since CM called her this morning and requested meeting with family.  Reminded Tasha Long that CM informed her three times during that conversation that everything was "fine" with her mother.    Discussed transfer to [facility]. Tasha Long responds_ that if patient is put in an ambulance- she will flat line- that patient gets that anxious with transport.  Asked if family wished to have transfer request cancelled and stated no.  Verbalizes  concern regarding use of sedation medications for a patient with COPD- rather than "pain meds."  Verbalize concerns over lack of continuity of communication between physicians and staff- i.e. the 5/5 weaning- when family question how long each trial should last- everyone gives a different answer.  "At [facility]"  everybody always knew everything about the patient.  When teams switched the team coming on knew exactly what was going on. When questions are asked by family at [facility]- they don't say the pulmonologist or the heart doctor has to answer that question- the team answered the questions no matter what".  CM asked a second time if wished to investigate other facilities for transfer Tasha Long says-no- that patient will die during any transport.  Addressed tracheostomy and Tasha Long says she can not continue with conversation and left.  Tasha Long remained.  Discussed the advantage of trach and weaning attempts would continue.  Discussed that if patient is not able to wean let's say over the weekend or Monday- should reconsider trach.  Tasha Long says that the patient would not want that but now that she is awake and alert patient can let family know what she wants.  Tasha Long says that she can not go into patient's room and ask her.  CM asked if CM could ask patient and she immediately said no.    Discussed if unable to wean would  need to consider skilled nursing that could handle ventilators and there is not one in [place].  Also discussed current need for dialysis and there are no stretcher outpatient dialysis centers in [place] responds with statement that "we don't have anything in [place]."  She says that her mother would not want to go to a facility that she would want to go home.  Tasha Long says that until this- patient's kidneys were at a stage 3 and now they are stage 4-6.  CM discussed that recent illness could certainly put a strain on patient's kidneys.  Discussed option of home ventilator and in home services through medicaid.  Emphasized that there must be adequate in home support for this care option.  At baseline line, patient lived a very sedentary life.  Bed to chair- to chair in living room- to computer.  "She has to be able to do at least that."    Again emphasized that in home care with ventilator would not be an option if patient continues to require dialysis and not able to sit. Restated there are no stretcher outpatient dialysis centers in [place] says that patient is alert and "talking" around the tube.  She feels patient can participate in some decisions regarding her care but does not want to initiate discussion topics.  Tasha Long acknowledges  that has been told if patient can tolerate weaning trial 3 days in a row- will consider extubation.  She is hopeful this will be a success.  Discussed that if patient becomes able to participate need to allow patient to have say in her care plan.  There are some very difficult decisions to be made regarding disposition-  tracheostomy; vent snf with stretcher dialysis- home with in home care and no dialysis if patient can not sit; extubation and no trach- return home with home health or short term skilled nursing in a local facility- but again must address continued dialysis  and ability to sit; or - home with comfort plan of care.  Provided [place] with contact  information for CM

## 2016-04-08 NOTE — Progress Notes (Signed)
Pharmacy Antibiotic Note  Tasha Long is a 60 y.o. female admitted on 03/25/2016 with respiratory distress.   Pharmacy has been consulted for vancomycin and meropenem for MRSA PNA and ESBL E. Coli UTI. Now with AKI.  Patient is currently on day #15 vancomycin and day #12 of meropenem. Patient now with AKI on CKD and on intermittent HD.   Plan: After discussion with Dr. Ashby Dawes, will continue abx for now.   Vancomycin level post-HD last PM was 10 mcg/ml. Patient given 1000 mg vancomycin x 1. Based on VD of 84 L, this dose should have gotten vancomycin level to ~ 22 mcg/ml. Patient receiving short session of HD today per nephrology of about 3 h. BFR currently at 300 so expect vancomycin level to fall below 15 mcg/ml after this session. Will order an additional dose of vancomycin 1000 mg post-HD. Will need to continue to follow dialysis sessions and order vancomycin/check random levels as indicated.   Continue meropenem 500mg  IV Q24H to be given after dialysis on HD days.      Height: 5\' 5"  (165.1 cm) Weight: (!) 325 lb 13.4 oz (147.8 kg) IBW/kg (Calculated) : 57  Temp (24hrs), Avg:98.2 F (36.8 C), Min:94.5 F (34.7 C), Max:98.8 F (37.1 C)   Recent Labs Lab 04/04/16 0428 04/04/16 1138 04/05/16 0416 04/05/16 0731 04/06/16 0516 04/07/16 0505 04/07/16 1837 04/08/16 0539  WBC 8.3  --  8.5  --  9.0 10.0  --  8.0  CREATININE  --  4.74* 5.00*  5.03*  --  5.24* 4.45*  --  4.01*  VANCOTROUGH  --   --   --  18  --   --   --   --   VANCORANDOM 19  --   --   --   --   --  10  --     Estimated Creatinine Clearance: 22.5 mL/min (by C-G formula based on Cr of 4.01).    Allergies  Allergen Reactions  . Codeine Hives  . Sulfa Antibiotics Hives    Antimicrobials this admission: Zosyn 4/14 >> 4/14 vancomycin 4/14 >>  Cefepime 4/14 >> 4/17 Metronidazole 4/14 >> 4/17 Meropenem 4/17 >>  Dose adjustments this admission: Merrem 500 mg IV q24 hours 4/18    Microbiology results: 4/19 TA: MRSA 4/14 BCx: negative UCx: 20k ESBL E coli MRSA PCR: positive  Thank you for allowing pharmacy to be a part of this patient's care.  Ulice Dash D, Pharm.D. Clinical Pharmacist 04/08/2016 11:40 AM

## 2016-04-08 NOTE — Progress Notes (Signed)
ANTICOAGULATION CONSULT NOTE - Initial Consult  Pharmacy Consult for Heparin Indication: pulmonary embolus  Allergies  Allergen Reactions  . Codeine Hives  . Sulfa Antibiotics Hives   Patient Measurements: Height: '5\' 5"'$  (165.1 cm) Weight: (!) 325 lb 13.4 oz (147.8 kg) IBW/kg (Calculated) : 57 Heparin Dosing Weight: 94.2 kg  Vital Signs: Temp: 98.2 F (36.8 C) (04/28 0800) Temp Source: Other (Comment) (04/28 0800) BP: 102/62 mmHg (04/28 0800) Pulse Rate: 61 (04/28 0800)  Labs:  Recent Labs  04/06/16 0516  04/07/16 0505 04/07/16 0733 04/07/16 2159 04/08/16 0539 04/08/16 0816  HGB 6.9*  --  7.4*  --   --  7.2*  --   HCT 22.1*  --  23.3*  --   --  22.8*  --   PLT 199  --  212  --   --  211  --   HEPARINUNFRC  --   < >  --  0.33 0.26*  --  0.29*  CREATININE 5.24*  --  4.45*  --   --  4.01*  --   < > = values in this interval not displayed.  Estimated Creatinine Clearance: 22.5 mL/min (by C-G formula based on Cr of 4.01).   Medical History: History reviewed. No pertinent past medical history.  Medications:  Scheduled:  . antiseptic oral rinse  7 mL Mouth Rinse 10 times per day  . budesonide (PULMICORT) nebulizer solution  0.25 mg Nebulization 4 times per day  . chlorhexidine gluconate (SAGE KIT)  15 mL Mouth Rinse BID  . feeding supplement (PRO-STAT SUGAR FREE 64)  30 mL Per Tube QID  . fentaNYL (SUBLIMAZE) injection  50 mcg Intravenous Once  . heparin PEDS  1,400 Units Intravenous Once  . insulin aspart  0-15 Units Subcutaneous 6 times per day  . ipratropium-albuterol  3 mL Nebulization Q6H  . levothyroxine  37.5 mcg Intravenous Daily  . meropenem (MERREM) IV  500 mg Intravenous Q24H  . pantoprazole (PROTONIX) IV  40 mg Intravenous QHS  . senna-docusate  1 tablet Oral Daily   Infusions:  . dexmedetomidine 0.6 mcg/kg/hr (04/08/16 0850)  . feeding supplement (VITAL HIGH PROTEIN) 1,000 mL (04/06/16 2000)  . fentaNYL infusion INTRAVENOUS Stopped (03/29/16 0837)   . heparin 2,200 Units/hr (04/08/16 0059)  . midazolam (VERSED) infusion Stopped (03/29/16 0837)  . norepinephrine Stopped (03/28/16 2331)    Assessment: 60 y/o F admitted with respiratory arrest ordered heparin for possible PE. Per MD, Cannot rule out PE as patient cannot receive contrast due to AKI.   Goal of Therapy:  Heparin level 0.3-0.7 units/ml Monitor platelets by anticoagulation protocol: Yes   Plan:  Heparin level subtherapeutic. 1500 unit IV x 1 bolus and increase rate to 2000 units/hr. Will recheck level in 6 hours.  4/17 at 21:52  HL = 0.56. Will continue current dosing.  Will recheck HL with AM labs.   4/18 AM heparin level 0.46. Continue current regimen. Recheck with CBC tomorrow AM.  4/19 AM heparin level 0.37. Continue current regimen. Recheck with CBC tomorrow AM.   4/20 AM heparin level 0.49. Continue current regimen. Recheck with CBC tomorrow AM.   4/21 AM heparin level 0.50. Continue current regimen. Recheck with CBC tomorrow AM.   4/22 AM heparin level 0.49. Continue current regimen. Recheck with CBC tomorrow AM.   4/23 AM heparin level 0.44. Continue current regimen. Recheck with CBC tomorrow AM.   4/24 AM heparin level 0.45. Continue current regimen. Recheck with CBC tomorrow AM.   4/25  AM heparin level 0.42. Continue current regimen of heparin 2000 units/hr. CBC and heparin level ordered with AM labs tomorrow.  4/26 AM heparin level 0.48. Continue current regimen of heparin 2000 units/hr. Will f/u AM CBC and HL.   4/27 AM heparin level 0.33 is therapeutic but decreased somewhat. Due to anemia, will not increase heparin drip at this time but will continue heparin drip at current rate and f/u AM labs.   04/27 2159 heparin level subtherapeutic. Increase rate to 2200 units/hr and will recheck level in 8 hours.  04/28 AM heparin level 0.29. Will bolus heparin 1400 units iv once and increase heparin infusion to 2400 units/hr. Will recheck a HL in 8 hours.    Ulice Dash D, Pharm.D., BCPS Clinical Pharmacist 04/08/2016,11:35 AM

## 2016-04-08 NOTE — Progress Notes (Signed)
Nutrition Follow-up  DOCUMENTATION CODES:   Morbid obesity  INTERVENTION:   -Recommend continuing current TF regimen of Vital High Protein at rate of 40 ml/hr with Prostat QID; continue to assess   NUTRITION DIAGNOSIS:   Inadequate oral intake related to acute illness as evidenced by NPO status.  Being addressed via TF  GOAL:   Provide needs based on ASPEN/SCCM guidelines  MONITOR:   TF tolerance, Vent status, Labs, Skin, I & O's  REASON FOR ASSESSMENT:   Ventilator    ASSESSMENT:    Pt remains on vent, plan for brief HD today, last HD on 4/26, UOP 1200 mL, lasix drip d/c. Tolerating Vital High Protein at rate of 40 ml/hr with Prostat QID  Patient is currently intubated on ventilator support MV: 14.9 L/min Temp (24hrs), Avg:98.2 F (36.8 C), Min:94.5 F (34.7 C), Max:98.8 F (37.1 C)  Diet Order:   NPO  Skin:  Reviewed, no issues  Last BM:  04/06/16 liquid stool via rectal tube   Labs: reviewed  Meds: ss novolog, precedex  Height:   Ht Readings from Last 1 Encounters:  04/08/16 5\' 5"  (1.651 m)    Weight:   Wt Readings from Last 1 Encounters:  04/08/16 325 lb 13.4 oz (147.8 kg)   Filed Weights   04/07/16 1045 04/08/16 0500 04/08/16 1030  Weight: 391 lb (177.356 kg) 326 lb (147.873 kg) 325 lb 13.4 oz (147.8 kg)   Ideal Body Weight:  56.8 kg  BMI:  Body mass index is 54.22 kg/(m^2).  Estimated Nutritional Needs:   Kcal:  1254-1425 kcals (22-25 kcals/kg IBW per ASPEN guidelines for BMI >50)  Protein:  143 g (2.5 g/kg)   Fluid:  >1.7 L  EDUCATION NEEDS:   Education needs no appropriate at this time  [place], [place], [place] [phone] Pager  [phone] Weekend/On-Call Pager

## 2016-04-08 NOTE — Progress Notes (Addendum)
PULMONARY / CRITICAL CARE MEDICINE   Name: Tasha Long MRN: [id number] DOB: 1956-11-03    ADMISSION DATE:  03/25/2016     BRIEF HISTORY: 60 year old female in chronically poor health, admitted to the ICU after suffering a brief cardiopulmonary arrest, PEA and 03/25/2016. Patient has past medical history significant for hypertension, hyperlipidemia, COPD, morbid obesity, hypothyroidism. Since admission to the ICU for active problems are: Acute on chronic renal failure Acute respiratory failure requiring mechanical ventilation with multilobar pneumonia ESBL Escherichia coli UTI Anemia Neurologic depressed state Daughter unable to make a decision on tracheostomy after multiple discussions. ENT and palliative care consulted.   SUBJECTIVE:  Patient was dialysed yesterday. SHe was able to tolerate  2 hours of 5/5 weaning  Yesterday.   MAJOR EVENTS/TEST RESULTS: 04/14 TTE: 0000000, G1 diastrolic dysfcn, mild LA dil, mod RV dil, mild RA Dil. 4 chamber dilation, with mod sys dysfcn and mild diastolic dysfcn w/diffuse hypokinesis 04/14 LE venous US: negative.  03/25/16; abdomen US: negative, but incomplete exam.    INDWELLING DEVICES:: ETT 04/14 >>  R [place] CVL 04/14 >>   MICRO DATA: Urine 04/14 >> Positive for E- Coli Resp 04/14 >> few gram negative rods  Sputum 4/19>> positive for MRSA. Blood 04/14 >> negative.  MRSA PCR positive.   PCT algorithm 04/14: 1.48  ANTIMICROBIALS:  Zosyn 4/14 >> 4/14 Cefepime 4/14 >> 4/17 Metronidazole 4/14 >> 4/17 Current vancomycin 4/14 >>  Meropenem 4/17 >>  VITAL SIGNS: Temp:  [94.5 F (34.7 C)-98.8 F (37.1 C)] 98.2 F (36.8 C) (04/28 0600) Pulse Rate:  [51-76] 52 (04/28 0600) Resp:  [19-29] 22 (04/28 0400) BP: (110-148)/(46-84) 122/57 mmHg (04/28 0600) SpO2:  [90 %-100 %] 100 % (04/28 0843) FiO2 (%):  [40 %] 40 % (04/28 0843) Weight:  [326 lb (147.873 kg)-391 lb (177.356 kg)] 326 lb (147.873 kg) (04/28  0500) HEMODYNAMICS:   VENTILATOR SETTINGS: Vent Mode:  [-] PRVC FiO2 (%):  [40 %] 40 % Set Rate:  [28 bmp] 28 bmp Vt Set:  [500 mL] 500 mL PEEP:  [5 cmH20] 5 cmH20 Pressure Support:  [5 cmH20] 5 cmH20 INTAKE / OUTPUT:  Intake/Output Summary (Last 24 hours) at 04/08/16 0944 Last data filed at 04/08/16 0700  Gross per 24 hour  Intake 1977.13 ml  Output   1225 ml  Net 752.13 ml    Review of Systems  Unable to perform ROS: intubated    Physical Exam General Appearance: Acutely ill  appearing white female Neuro:Atraumatic, normocephalic, follows simple commands  HEENT:  Opens eyes spontaneously,Pupils  reacting to light,no JVD appreciated, no discharge noted. Pulmonary: Clear bilaterally, no weezes, crackles, rhonchi noted  Cardiovascular Normal S1,S2. No MRG, RRR  Abdomen Obese, round, active bowel sounds Renal: No costovertebral tenderness  GU: not performed Endocrine: No evident thyromegaly. Skin: warm,, no ecchymosis ,cellulitis present , red Extremities: +3 pitting edema, warm to touch, red   LABS:  CBC  Recent Labs Lab 04/06/16 0516 04/07/16 0505 04/08/16 0539  WBC 9.0 10.0 8.0  HGB 6.9* 7.4* 7.2*  HCT 22.1* 23.3* 22.8*  PLT 199 212 211   Coag's No results for input(s): APTT, INR in the last 168 hours. BMET  Recent Labs Lab 04/06/16 0516 04/07/16 0505 04/08/16 0539  NA 139 142 140  K 4.4 4.1 4.0  CL 101 104 102  CO2 27 30 28   BUN 98* 91* 80*  CREATININE 5.24* 4.45* 4.01*  GLUCOSE 115* 121* 125*   Electrolytes  Recent Labs Lab 04/04/16 1138  04/05/16 0416 04/06/16 0516 04/07/16 0505 04/08/16 0539  CALCIUM 9.3 9.3  9.3 9.0 8.5* 8.6*  PHOS 5.5* 6.5*  --   --   --    Sepsis Markers No results for input(s): LATICACIDVEN, PROCALCITON, O2SATVEN in the last 168 hours. ABG  Recent Labs Lab 04/06/16 0455 04/07/16 0400 04/08/16 0445  PHART 7.33* 7.39 7.34*  PCO2ART 54* 52* 56*  PO2ART 107 105 114*   Liver Enzymes  Recent  Labs Lab 04/04/16 1138 04/05/16 0416  ALBUMIN 2.4* 2.4*   Cardiac Enzymes No results for input(s): TROPONINI, PROBNP in the last 168 hours. Glucose  Recent Labs Lab 04/07/16 1302 04/07/16 1702 04/07/16 2016 04/08/16 0111 04/08/16 0352 04/08/16 0728  GLUCAP 109* 107* 106* 111* 114* 135*    Imaging Dg Chest 1 View  04/08/2016  CLINICAL DATA:  Dyspnea EXAM: CHEST 1 VIEW COMPARISON:  04/07/2016 FINDINGS: Cardiac shadow is stable. A an endotracheal tube, nasogastric catheter, right subclavian central line and left jugular dialysis catheter are again noted and stable. Improved aeration left base is noted. No focal infiltrate is seen. No pneumothorax is noted. IMPRESSION: Tubes and lines as described. No acute abnormality is seen. Electronically Signed   By: Inez Catalina M.D.   On: 04/08/2016 07:11    ASSESSMENT / PLAN: 60 F in chronically very poor health . Status post  respiratory arrest and brief cardiac arrest in the transfer from her bed to wheelchair and into ambulance. Intubated and transported to ED. Suffered brief PEA arrest again in ICU shortly after arrival.   PULMONARY A: Respiratory arrest COPD OSA Smoker Concern for PE given numerous RFs - no CTA given AKI.   Tracheostomy is tentatively planned for Tuesday, please notify ENT if this is to be canceled due to extubation. Chest x-ray images reviewed today, reduced bibasilar atelectasis,  Mild Reduction in interstitial edema P:  Patient did not tolerate pressure support ventilation on 4/26, but didn't tolerate 5/5 on 4/27. For approximately 2 hours.  On Precedex, fentanyl off at this time Continue heparin infusion empirically, as unable to obtain CT chest -We'll reattempt 5/5 trial today. It was explained to the family that if the patient could tolerate trials for 3 days in a row, we could consider  extubating the patient. If she were to be reintubated, then she would definitely need a  tracheostomy.  CARDIOVASCULAR A:  Cardiac arrest due to respiratory arrest Hypertension Hyperlipidemia P:  Echo indicative of Four-chamber dilated patient, with moderate left ventricle systolic dysfunction and mild diastolic dysfunction with diffuse hypokinesis  MAP goal > 65 mmHg  RENAL A:  AKI, increased creatinine today.  Hyperkalemia Very poor candidate for HD P:  Monitor I/O, monitor kidney function.  CRRT Per nephrology - starting 4/21 Continue Lasix infusion, patient net positive for 14 L since admission  GASTROINTESTINAL A:  Morbid obesity Abdominal pain Diarrhea P:  SUP: IV PPI TF protocol FMS  . HEMATOLOGIC A:  Anemia with microcytosis - no overt bleeding P:  DVT px: full dose heparin heparin Monitor CBC intermittently 1 unit of PRBCs transfused on 4/26-improved H&H   INFECTIOUS A:  Possible abdominal sepsis Possible PNA Possible LE cellulitis with prior hx of MRSA cellulitis ESBL in the urine P:  Monitor fever curve Follow cultures Continue vanc and meropenem    ENDOCRINE A:  Suspect insulin resistance though no history of DM Glucose continues to be controlled.  Hypothyroidism P:  IV L-thyroxine. Transition to enteral when able SSI - mod scale  NEUROLOGIC  A:  Post arrest anoxic encephalopathy, she appears to be making  improvement in terms of her mental status ICU associated discomfort P:  RASS goal: -2, -3 PAD protocol Daily W[facility] CT head was negative for any acute abnormality   Critical Care Attestation.  I have personally obtained a history, examined the patient, evaluated laboratory and imaging results, formulated the assessment and plan and placed orders. The Patient requires high complexity decision making for assessment and support, frequent evaluation and titration of therapies, application of advanced monitoring technologies and extensive interpretation of multiple databases. The patient has  critical illness that could lead imminently to failure of 1 or more organ systems and requires the highest level of physician preparedness to intervene.  Critical Care Time devoted to patient care services described in this note is 35 minutes and is exclusive of time spent in procedures. Marda Stalker, M.D. 04/08/2016

## 2016-04-08 NOTE — Progress Notes (Signed)
ANTICOAGULATION CONSULT NOTE - Initial Consult  Pharmacy Consult for Heparin Indication: pulmonary embolus  Allergies  Allergen Reactions  . Codeine Hives  . Sulfa Antibiotics Hives   Patient Measurements: Height: _0  (165.1 cm) Weight: (!) 325 lb 13.4 oz (147.8 kg) IBW/kg (Calculated) : 57 Heparin Dosing Weight: 94.2 kg  Vital Signs: Temp: 99 F (37.2 C) (04/28 1900) Temp Source: Other (Comment) (04/28 1600) BP: 113/63 mmHg (04/28 1900) Pulse Rate: 56 (04/28 1900)  Labs:  Recent Labs  04/06/16 0516  04/07/16 0505  04/07/16 2159 04/08/16 0539 04/08/16 0816 04/08/16 1907  HGB 6.9*  --  7.4*  --   --  7.2*  --   --   HCT 22.1*  --  23.3*  --   --  22.8*  --   --   PLT 199  --  212  --   --  211  --   --   HEPARINUNFRC  --   < >  --   < > 0.26*  --  0.29* 0.48  CREATININE 5.24*  --  4.45*  --   --  4.01*  --   --   < > = values in this interval not displayed.  Estimated Creatinine Clearance: 22.5 mL/min (by C-G formula based on Cr of 4.01).   Medical History: History reviewed. No pertinent past medical history.  Medications:  Scheduled:  . antiseptic oral rinse  7 mL Mouth Rinse 10 times per day  . budesonide (PULMICORT) nebulizer solution  0.25 mg Nebulization 4 times per day  . chlorhexidine gluconate (SAGE KIT)  15 mL Mouth Rinse BID  . feeding supplement (PRO-STAT SUGAR FREE 64)  30 mL Per Tube QID  . fentaNYL (SUBLIMAZE) injection  50 mcg Intravenous Once  . insulin aspart  0-15 Units Subcutaneous 6 times per day  . ipratropium-albuterol  3 mL Nebulization Q6H  . levothyroxine  37.5 mcg Intravenous Daily  . meropenem (MERREM) IV  500 mg Intravenous Q24H  . pantoprazole (PROTONIX) IV  40 mg Intravenous QHS  . senna-docusate  1 tablet Oral Daily   Infusions:  . dexmedetomidine 1 mcg/kg/hr (04/08/16 1836)  . feeding supplement (VITAL HIGH PROTEIN) 1,000 mL (04/06/16 2000)  . fentaNYL infusion INTRAVENOUS Stopped (03/29/16 0837)  . heparin 2,400  Units/hr (04/08/16 1150)  . midazolam (VERSED) infusion Stopped (03/29/16 0837)  . norepinephrine Stopped (03/28/16 2331)    Assessment: 60 y/o F admitted with respiratory arrest ordered heparin for possible PE. Per MD, Cannot rule out PE as patient cannot receive contrast due to AKI.   Goal of Therapy:  Heparin level 0.3-0.7 units/ml Monitor platelets by anticoagulation protocol: Yes   Plan:  Heparin level subtherapeutic. 1500 unit IV x 1 bolus and increase rate to 2000 units/hr. Will recheck level in 6 hours.  4/17 at 21:52  HL = 0.56. Will continue current dosing.  Will recheck HL with AM labs.   4/18 AM heparin level 0.46. Continue current regimen. Recheck with CBC tomorrow AM.  4/19 AM heparin level 0.37. Continue current regimen. Recheck with CBC tomorrow AM.   4/20 AM heparin level 0.49. Continue current regimen. Recheck with CBC tomorrow AM.   4/21 AM heparin level 0.50. Continue current regimen. Recheck with CBC tomorrow AM.   4/22 AM heparin level 0.49. Continue current regimen. Recheck with CBC tomorrow AM.   4/23 AM heparin level 0.44. Continue current regimen. Recheck with CBC tomorrow AM.   4/24 AM heparin level 0.45. Continue current regimen. Recheck  with CBC tomorrow AM.   4/25 AM heparin level 0.42. Continue current regimen of heparin 2000 units/hr. CBC and heparin level ordered with AM labs tomorrow.  4/26 AM heparin level 0.48. Continue current regimen of heparin 2000 units/hr. Will f/u AM CBC and HL.   4/27 AM heparin level 0.33 is therapeutic but decreased somewhat. Due to anemia, will not increase heparin drip at this time but will continue heparin drip at current rate and f/u AM labs.   04/27 2159 heparin level subtherapeutic. Increase rate to 2200 units/hr and will recheck level in 8 hours.  04/28 AM heparin level 0.29. Will bolus heparin 1400 units iv once and increase heparin infusion to 2400 units/hr. Will recheck a HL in 8 hours.   04/28  HL @  19:00 = 0.48.  Will continue this pt on current rate of 2400 units/hr and recheck HL in 8 hrs on 4/29 @ 3:00.   Janisa Labus D, Pharm.D., BCPS Clinical Pharmacist 04/08/2016,8:13 PM

## 2016-04-08 NOTE — Care Management (Signed)
Have reached out to patient's family and spoke with daughter Edmonia Lynch.  Family is currently off the unit but will return later today.  Asked that CM be notified when family arrives.  Did ask if family would like to pursue other tertiary facilities and Edmonia Lynch will discuss with her father- the patient's husband.  Spoke with Corene Cornea at the [facility].  Facility does have updated clinical and there is not a bed available in the MICU.

## 2016-04-09 ENCOUNTER — Inpatient Hospital Stay: Payer: Medicaid Other

## 2016-04-09 LAB — BLOOD GAS, ARTERIAL
ACID-BASE EXCESS: 7.8 mmol/L — AB (ref 0.0–3.0)
BICARBONATE: 32 meq/L — AB (ref 21.0–28.0)
FIO2: 0.4
MECHVT: 500 mL
O2 Saturation: 99.4 %
PATIENT TEMPERATURE: 37
PEEP/CPAP: 5 cmH2O
PH ART: 7.48 — AB (ref 7.350–7.450)
PO2 ART: 144 mmHg — AB (ref 83.0–108.0)
RATE: 28 resp/min
pCO2 arterial: 43 mmHg (ref 32.0–48.0)

## 2016-04-09 LAB — CBC
HCT: 24.5 % — ABNORMAL LOW (ref 35.0–47.0)
Hemoglobin: 7.9 g/dL — ABNORMAL LOW (ref 12.0–16.0)
MCH: 24.7 pg — AB (ref 26.0–34.0)
MCHC: 32.3 g/dL (ref 32.0–36.0)
MCV: 76.5 fL — ABNORMAL LOW (ref 80.0–100.0)
Platelets: 225 10*3/uL (ref 150–440)
RBC: 3.21 MIL/uL — ABNORMAL LOW (ref 3.80–5.20)
RDW: 19.2 % — ABNORMAL HIGH (ref 11.5–14.5)
WBC: 8.5 10*3/uL (ref 3.6–11.0)

## 2016-04-09 LAB — GLUCOSE, CAPILLARY
GLUCOSE-CAPILLARY: 105 mg/dL — AB (ref 65–99)
GLUCOSE-CAPILLARY: 107 mg/dL — AB (ref 65–99)
GLUCOSE-CAPILLARY: 118 mg/dL — AB (ref 65–99)
GLUCOSE-CAPILLARY: 120 mg/dL — AB (ref 65–99)
Glucose-Capillary: 111 mg/dL — ABNORMAL HIGH (ref 65–99)
Glucose-Capillary: 113 mg/dL — ABNORMAL HIGH (ref 65–99)
Glucose-Capillary: 120 mg/dL — ABNORMAL HIGH (ref 65–99)
Glucose-Capillary: 124 mg/dL — ABNORMAL HIGH (ref 65–99)

## 2016-04-09 LAB — BASIC METABOLIC PANEL
ANION GAP: 12 (ref 5–15)
BUN: 67 mg/dL — ABNORMAL HIGH (ref 6–20)
CHLORIDE: 102 mmol/L (ref 101–111)
CO2: 27 mmol/L (ref 22–32)
CREATININE: 3.28 mg/dL — AB (ref 0.44–1.00)
Calcium: 8.8 mg/dL — ABNORMAL LOW (ref 8.9–10.3)
GFR calc non Af Amer: 14 mL/min — ABNORMAL LOW (ref >60)
GFR, EST AFRICAN AMERICAN: 17 mL/min — AB (ref >60)
Glucose, Bld: 125 mg/dL — ABNORMAL HIGH (ref 65–99)
POTASSIUM: 4.2 mmol/L (ref 3.5–5.1)
Sodium: 141 mmol/L (ref 135–145)

## 2016-04-09 LAB — HEPARIN LEVEL (UNFRACTIONATED): Heparin Unfractionated: 0.52 IU/mL (ref 0.30–0.70)

## 2016-04-09 MED ORDER — FENTANYL CITRATE (PF) 100 MCG/2ML IJ SOLN
25.0000 ug | INTRAMUSCULAR | Status: DC | PRN
  Administered 2016-04-09 (×2): 50 ug via INTRAVENOUS
  Filled 2016-04-09 (×4): qty 2

## 2016-04-09 MED ORDER — PANTOPRAZOLE SODIUM 40 MG PO PACK
40.0000 mg | PACK | Freq: Every day | ORAL | Status: DC
  Administered 2016-04-09: 40 mg
  Filled 2016-04-09: qty 20

## 2016-04-09 MED ORDER — LEVOTHYROXINE SODIUM 75 MCG PO TABS
75.0000 ug | ORAL_TABLET | Freq: Every day | ORAL | Status: DC
  Administered 2016-04-10: 75 ug
  Filled 2016-04-09: qty 1

## 2016-04-09 MED ORDER — MIDAZOLAM BOLUS VIA INFUSION
1.0000 mg | INTRAVENOUS | Status: DC | PRN
  Filled 2016-04-09: qty 2

## 2016-04-09 NOTE — Progress Notes (Signed)
Pt tolerated dialysis well, RN called [facility] and was told no current bed avl, so no transfer at this time. MD aware. No family here to update. Pt rested better this am , but needed small dose of prn Fentyl to do so as becmae very agitated inearly afternoon, pulled off leads and nearly extubated self

## 2016-04-09 NOTE — Progress Notes (Signed)
[facility]  ROUNDING NOTE   Subjective:   HD was 4/22, 4/26,4/27, 4/28  UF 1000 cc , UOP 980 cc last 24 hrs + dependent edema Following commands this AM Remains ventilator dependent    Objective:  Vital signs in last 24 hours:  Temp:  [97.9 F (36.6 C)-99 F (37.2 C)] 98.1 F (36.7 C) (04/29 0800) Pulse Rate:  [51-90] 53 (04/29 0800) Resp:  [19-29] 19 (04/29 0800) BP: (107-168)/(46-89) 139/71 mmHg (04/29 0800) SpO2:  [98 %-100 %] 99 % (04/29 0800) FiO2 (%):  [35 %-40 %] 35 % (04/29 0816)  Weight change: -29.556 kg (-65 lb 2.5 oz) Filed Weights   04/07/16 1045 04/08/16 0500 04/08/16 1030  Weight: 177.356 kg (391 lb) 147.873 kg (326 lb) 147.8 kg (325 lb 13.4 oz)    Intake/Output: I/O last 3 completed shifts: In: 3226.1 [I.V.:2006.1; NG/GT:1120; IV Piggyback:100] Out: 2205 [Urine:1080; Other:1000; Stool:125]   Intake/Output this shift:     Physical Exam: General: Critically ill  Head: ETT OGT  Eyes: Eyes open, tracking  Neck: trachea midline  Lungs:  Coarse breath sounds, vent assisted  Heart: Regular rate and rhythm  Abdomen:  Soft, nontender, obese, no bowel sounds  Extremities: + peripheral edema.  Neurologic:  intubated, trying to follow commands  Skin: Erythema bilateral lower extremities  Access: Left IJ vascath 4/17 Dr. Mortimer Fries    Basic Metabolic Panel:  Recent Labs Lab 04/04/16 1138 04/05/16 0416 04/06/16 0516 04/07/16 0505 04/08/16 0539 04/09/16 0307  NA 140 140  140 139 142 140 141  K 4.0 4.2  4.2 4.4 4.1 4.0 4.2  CL 102 103  104 101 104 102 102  CO2 28 30  30 27 30 28 27   GLUCOSE 95 104*  103* 115* 121* 125* 125*  BUN 72* 84*  85* 98* 91* 80* 67*  CREATININE 4.74* 5.00*  5.03* 5.24* 4.45* 4.01* 3.28*  CALCIUM 9.3 9.3  9.3 9.0 8.5* 8.6* 8.8*  PHOS 5.5* 6.5*  --   --   --   --     Liver Function Tests:  Recent Labs Lab 04/04/16 1138 04/05/16 0416  ALBUMIN 2.4* 2.4*   No results for input(s): LIPASE, AMYLASE  in the last 168 hours. No results for input(s): AMMONIA in the last 168 hours.  CBC:  Recent Labs Lab 04/05/16 0416 04/06/16 0516 04/07/16 0505 04/08/16 0539 04/09/16 0307  WBC 8.5 9.0 10.0 8.0 8.5  NEUTROABS 5.7  --   --   --   --   HGB 7.0* 6.9* 7.4* 7.2* 7.9*  HCT 22.8* 22.1* 23.3* 22.8* 24.5*  MCV 74.7* 74.6* 75.1* 75.0* 76.5*  PLT 189 199 212 211 225    Cardiac Enzymes: No results for input(s): CKTOTAL, CKMB, CKMBINDEX, TROPONINI in the last 168 hours.  BNP: Invalid input(s): POCBNP  CBG:  Recent Labs Lab 04/08/16 1558 04/08/16 2107 04/09/16 0016 04/09/16 0534 04/09/16 0716  GLUCAP 120* 131* 118* 107* 111*    Microbiology: Results for orders placed or performed during the hospital encounter of 03/25/16  Culture, blood (routine x 2)     Status: None   Collection Time: 03/25/16  7:19 AM  Result Value Ref Range Status   Specimen Description BLOOD LEFT FOREARM  Final   Special Requests BOTTLES DRAWN AEROBIC AND ANAEROBIC  1CC  Final   Culture NO GROWTH 5 DAYS  Final   Report Status 03/30/2016 FINAL  Final  Culture, blood (routine x 2)     Status: None  Collection Time: 03/25/16  7:19 AM  Result Value Ref Range Status   Specimen Description BLOOD LEFT HAND  Final   Special Requests BOTTLES DRAWN AEROBIC AND ANAEROBIC  1CC  Final   Culture NO GROWTH 5 DAYS  Final   Report Status 03/30/2016 FINAL  Final  MRSA PCR Screening     Status: Abnormal   Collection Time: 03/25/16 12:42 PM  Result Value Ref Range Status   MRSA by PCR POSITIVE (A) NEGATIVE Final    Comment:        The GeneXpert MRSA Assay (FDA approved for NASAL specimens only), is one component of a comprehensive MRSA colonization surveillance program. It is not intended to diagnose MRSA infection nor to guide or monitor treatment for MRSA infections. CRITICAL RESULT CALLED TO, READ BACK BY AND VERIFIED WITH:  AMELIA BERRY AT 1644 03/25/16 SDR   Urine culture     Status: Abnormal    Collection Time: 03/26/16  1:02 AM  Result Value Ref Range Status   Specimen Description URINE, RANDOM  Final   Special Requests NONE  Final   Culture (A)  Final    20,000 COLONIES/mL ESCHERICHIA COLI Results Called to: AMELIA BERRY, RN  AT 1055 ON 03/28/16 BY CTJ. ESBL-EXTENDED SPECTRUM BETA LACTAMASE-THE ORGANISM IS RESISTANT TO PENICILLINS, CEPHALOSPORINS AND AZTREONAM ACCORDING TO CLSI M100-S15 VOL.[place].    Report Status 03/28/2016 FINAL  Final   Organism ID, Bacteria ESCHERICHIA COLI (A)  Final      Susceptibility   Escherichia coli - MIC*    AMPICILLIN >=32 RESISTANT Resistant     CEFAZOLIN >=64 RESISTANT Resistant     CEFTRIAXONE >=64 RESISTANT Resistant     CIPROFLOXACIN >=4 RESISTANT Resistant     GENTAMICIN <=1 SENSITIVE Sensitive     IMIPENEM <=0.25 SENSITIVE Sensitive     NITROFURANTOIN <=16 SENSITIVE Sensitive     TRIMETH/SULFA >=320 RESISTANT Resistant     AMPICILLIN/SULBACTAM 16 INTERMEDIATE Intermediate     PIP/TAZO <=4 SENSITIVE Sensitive     Extended ESBL POSITIVE Resistant     * 20,000 COLONIES/mL ESCHERICHIA COLI  Culture, respiratory (tracheal aspirate)     Status: None   Collection Time: 03/30/16  8:50 AM  Result Value Ref Range Status   Specimen Description TRACHEAL ASPIRATE  Final   Special Requests NONE  Final   Gram Stain FEW WBC SEEN RARE GRAM NEGATIVE RODS   Final   Culture   Final    MODERATE GROWTH METHICILLIN RESISTANT STAPHYLOCOCCUS AUREUS LIGHT GROWTH OCHROBACTRUM ANTHROPI MODERATE GROWTH GRAM NEGATIVE RODS UNABLE TO FURTHER ID BETA LACTAMASE POSITIVE    Report Status 04/05/2016 FINAL  Final   Organism ID, Bacteria METHICILLIN RESISTANT STAPHYLOCOCCUS AUREUS  Final      Susceptibility   Methicillin resistant staphylococcus aureus - MIC*    CIPROFLOXACIN <=0.5 SENSITIVE Sensitive     ERYTHROMYCIN >=8 RESISTANT Resistant     GENTAMICIN <=0.5 SENSITIVE Sensitive     OXACILLIN >=4 RESISTANT Resistant     TETRACYCLINE 2 SENSITIVE  Sensitive     VANCOMYCIN 1 SENSITIVE Sensitive     TRIMETH/SULFA >=320 RESISTANT Resistant     CLINDAMYCIN >=8 RESISTANT Resistant     RIFAMPIN <=0.5 SENSITIVE Sensitive     Inducible Clindamycin NEGATIVE Sensitive     * MODERATE GROWTH METHICILLIN RESISTANT STAPHYLOCOCCUS AUREUS    Coagulation Studies: No results for input(s): LABPROT, INR in the last 72 hours.  Urinalysis: No results for input(s): COLORURINE, LABSPEC, [place], [place], [place], BILIRUBINUR, KETONESUR,  PROTEINUR, UROBILINOGEN, NITRITE, LEUKOCYTESUR in the last 72 hours.  Invalid input(s): APPERANCEUR    Imaging: Dg Chest 1 View  04/09/2016  CLINICAL DATA:  CCU PT, Dyspnea EXAM: CHEST 1 VIEW COMPARISON:  04/08/2016 FINDINGS: Stable cardiac enlargement and support devices. Mild diffuse interstitial prominence. No definite pulmonary edema. No consolidation or effusion. IMPRESSION: No significant change. Electronically Signed   By: Skipper Cliche M.D.   On: 04/09/2016 08:13   Dg Chest 1 View  04/08/2016  CLINICAL DATA:  Dyspnea EXAM: CHEST 1 VIEW COMPARISON:  04/07/2016 FINDINGS: Cardiac shadow is stable. A an endotracheal tube, nasogastric catheter, right subclavian central line and left jugular dialysis catheter are again noted and stable. Improved aeration left base is noted. No focal infiltrate is seen. No pneumothorax is noted. IMPRESSION: Tubes and lines as described. No acute abnormality is seen. Electronically Signed   By: Inez Catalina M.D.   On: 04/08/2016 07:11     Medications:   . dexmedetomidine 1.1 mcg/kg/hr (04/09/16 0626)  . feeding supplement (VITAL HIGH PROTEIN) 1,000 mL (04/08/16 2343)  . heparin 2,400 Units/hr (04/09/16 0939)   . antiseptic oral rinse  7 mL Mouth Rinse 10 times per day  . budesonide (PULMICORT) nebulizer solution  0.25 mg Nebulization 4 times per day  . chlorhexidine gluconate (SAGE KIT)  15 mL Mouth Rinse BID  . feeding supplement (PRO-STAT SUGAR FREE 64)  30 mL Per Tube QID  .  insulin aspart  0-15 Units Subcutaneous 6 times per day  . ipratropium-albuterol  3 mL Nebulization Q6H  . [START ON 04/10/2016] levothyroxine  75 mcg Per Tube QAC breakfast  . pantoprazole sodium  40 mg Per Tube Q1200   sodium chloride, acetaminophen, albuterol, fentaNYL, midazolam, ondansetron (ZOFRAN) IV, sodium chloride flush  Assessment/ Plan:  Tasha Long is a 61 y.o. 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   1. Acute renal failure on chronic kidney disease stage III with baseline creatinine of 1.2, eGFR of 49:   Secondary to ATN. With elevated vancomycin levels.    - UOP fair, BUN still high  - Will plan for short HD today. 3 Hrs. UF goal 0.5 kg.   - Dose meds for CrCl < 30 - Avoid nephrotoxic agents.   2. Acute respiratory failure:  intubated on mechanical ventilation. with multilobar pneumonia.   3. Sepsis/Hypotension secondary to ESBL E. Coli Urinary tract infection and pneumonia (sputum with MRSA):  no longer on vasopressors.    4. Generalized edema - fluid removal with HD, UOP improving some - Volume status is difficult to determine due to body habitus    5. Morbid obesity - baseline functional status - Bed to chair    LOS: 15 Tasha Long 4/29/201710:41 AM

## 2016-04-09 NOTE — Progress Notes (Signed)
PULMONARY / CRITICAL CARE MEDICINE   Name: Tasha Long MRN: [id number] DOB: 16-Mar-1956    ADMISSION DATE:  03/25/2016     PT PROFILE:  25 F in chronically very poor health developed abdominal pain and EMS was dispatched. Suffered respiratory arrest and brief cardiac arrest in the transfer from her bed to wheelchair and into ambulance. Intubated and transported to ED. Suffered brief PEA arrest again in ICU shortly after arrival  SUBJECTIVE:  Patient was dialysed yesterday. SHe was able to tolerate  2 hours of 5/5 weaning  Yesterday.   MAJOR EVENTS/TEST RESULTS: 04/14 TTE: 0000000, G1 diastrolic dysfcn, mild LA dil, mod RV dil, mild RA Dil. 4 chamber dilation, with mod sys dysfcn and mild diastolic dysfcn w/diffuse hypokinesis 04/14 LE venous US: poor quality study due to body habitus. No DVT seen 04/14 abdomen US: IMPRESSION: 1. No explanation for abdominal pain. Bowel gas obscures the lower abdominal aorta. Cholecystectomy. 04/15 CT chest (wihtout contrast): Severe multilobar bronchopneumonia throughout the lungs bilaterally. Multiple borderline enlarged and minimally enlarged mediastinal and bilateral hilar lymph nodes, presumably reactive. Mild dilatation of the pulmonic trunk (3.5 cm in diameter), suggestive of pulmonary arterial hypertension 04/15 CTAP: No acute pathology 04/16 Renal Consultation 04/17 CT head: Minimal diffuse cortical atrophy. Mild chronic ischemic white matter disease. Mild bilateral maxillary and ethmoid sinusitis. No acute intracranial abnormality seen 04/18 EEG: EEG secondary to general background slowing. This finding may be seen with a diffuse disturbance that is etiologically nonspecific, but may include a metabolic encephalopathy, among other possibilities. No epileptiform activity was noted 04/19 Neurology consultation: Prognosis for neurological recovery deemed guarded 04/21 intermittent HD initiated 04/26 received one unit PRBCs for Hgb  6.9 04/27 ENT consultation for planned trach tube placement  INDWELLING DEVICES:: ETT 04/14 >>  R [place] CVL 04/14 >>  L IJ HD cath 04/17 >>   MICRO DATA: MRSA PCR 04/14 >> POS Urine 04/14 >> ESBL E coli (sens only to imipenem, pip-tazo, gent) Resp 04/14 >> few gram negative rods  Blood 04/14 >> NEG  Resp 04/19>>  MRSA  PCT algorithm 04/14-04/16: < 0.10, 1.48, 1.59 PCT 04/30:   ANTIMICROBIALS:  Zosyn 4/14 >> 4/14 Cefepime 4/14 >> 4/17 Metronidazole 4/14 >> 4/17 Meropenem 4/17 >> 04/29 vancomycin 4/14 >> 04/29  SUBJ: RASS -1, -2 on dexmedetomidine infusion. + F/C  VITAL SIGNS: Temp:  [98.1 F (36.7 C)-99 F (37.2 C)] 98.1 F (36.7 C) (04/29 0800) Pulse Rate:  [51-90] 53 (04/29 0800) Resp:  [19-29] 19 (04/29 0800) BP: (107-168)/(46-89) 139/71 mmHg (04/29 0800) SpO2:  [98 %-100 %] 99 % (04/29 0800) FiO2 (%):  [35 %-40 %] 35 % (04/29 1221) HEMODYNAMICS:   VENTILATOR SETTINGS: Vent Mode:  [-] PRVC FiO2 (%):  [35 %-40 %] 35 % Set Rate:  [28 bmp] 28 bmp Vt Set:  [500 mL] 500 mL PEEP:  [5 cmH20] 5 cmH20 Pressure Support:  [5 cmH20] 5 cmH20 INTAKE / OUTPUT:  Intake/Output Summary (Last 24 hours) at 04/09/16 1234 Last data filed at 04/09/16 0304  Gross per 24 hour  Intake 1230.89 ml  Output   1800 ml  Net -569.11 ml    Review of Systems  Unable to perform ROS: intubated    Physical Exam General Appearance: obese, intubated, RASS -2, + F/C with effort Neuro: EOMI, PERRL, MAEs, diffusely weak HEENT:  NCAT Pulmonary: Clear anteriorly  Cardiovascular: regular, no M noted Abdomen Obese, soft, mildly tender in BUQs, + BS Skin: chronic LE venous stasis changed Extremities: brawny  and pittiing edema in BLE   LABS:  CBC  Recent Labs Lab 04/07/16 0505 04/08/16 0539 04/09/16 0307  WBC 10.0 8.0 8.5  HGB 7.4* 7.2* 7.9*  HCT 23.3* 22.8* 24.5*  PLT 212 211 225   Coag's No results for input(s): APTT, INR in the last 168 hours. BMET  Recent  Labs Lab 04/07/16 0505 04/08/16 0539 04/09/16 0307  NA 142 140 141  K 4.1 4.0 4.2  CL 104 102 102  CO2 30 28 27   BUN 91* 80* 67*  CREATININE 4.45* 4.01* 3.28*  GLUCOSE 121* 125* 125*   Electrolytes  Recent Labs Lab 04/04/16 1138 04/05/16 0416  04/07/16 0505 04/08/16 0539 04/09/16 0307  CALCIUM 9.3 9.3  9.3  < > 8.5* 8.6* 8.8*  PHOS 5.5* 6.5*  --   --   --   --   < > = values in this interval not displayed. Sepsis Markers No results for input(s): LATICACIDVEN, PROCALCITON, O2SATVEN in the last 168 hours. ABG  Recent Labs Lab 04/07/16 0400 04/08/16 0445 04/09/16 0350  PHART 7.39 7.34* 7.48*  PCO2ART 52* 56* 43  PO2ART 105 114* 144*   Liver Enzymes  Recent Labs Lab 04/04/16 1138 04/05/16 0416  ALBUMIN 2.4* 2.4*   Cardiac Enzymes No results for input(s): TROPONINI, PROBNP in the last 168 hours. Glucose  Recent Labs Lab 04/08/16 1558 04/08/16 2107 04/09/16 0016 04/09/16 0534 04/09/16 0716 04/09/16 1113  GLUCAP 120* 131* 118* 107* 111* 120*    CXR: Suspect CM, diffuse interstitial prominence, bilateral basilar atx   ASSESSMENT / PLAN:  PULMONARY A: S/P respiratory arrest COPD, Smoker PTA OSA Concern for PE based on RFs and initial presentation Prolonged VDRF High PIPs - appears due to airflow obstruction plus severe obesity P:  Cont full vent support - settings reviewed and/or adjusted Cont vent bundle Daily SBT if/when meets criteria Continue nebulized steroids and bronchodilators Continue full anticoagulation  At this point, would commit her to long term anticoagulation given numerous and likely permanent RFs Will need trach tube to successfully be liberated from vent  CARDIOVASCULAR A:  Cardiac arrest due to respiratory arrest Hypertension, controlled Hyperlipidemia P:  Monitor BP and rhythm MAP goal > 65 mmhg  RENAL A:  AKI, oliguric > nonoliguiric Hyperkalemia, resolved Very poor candidate for long term HD P:   Monitor BMET intermittently Monitor I/Os Correct electrolytes as indicated Cont HD per renal service  GASTROINTESTINAL A:  Morbid obesity Abdominal pain, nonspecific - no clear etiology Diarrhea P:  SUP: enteral PPI TF protocol  HEMATOLOGIC A:  ICU acquired anemia with microcytosis - no overt bleeding P:  DVT px: full dose heparin Monitor CBC intermittently Transfuse per usual guidelines  INFECTIOUS A:  Concern for abdominal sepsis on admission - no confirming evidence MRSA PNA PNA Possible LE cellulitis Prior hx of MRSA cellulitis ESBL in the urine P:  Monitor temp, WBC count Micro and abx as above  ENDOCRINE A:  Suspect insulin resistance though no history of DM Hypothyroidism P:  Change levothyroxine to enteral Cont SSI - mod scale  NEUROLOGIC A:  Post arrest anoxic encephalopathy, improving ICU associated discomfort Severe deconditioning P:  RASS goal: 0, -1 Continue dex Minimize sedating medsd Will need extensive rehab if survives this acute phase of illness  CCM time: 45 mins  The above time includes time spent in consultation with patient and/or family members and reviewing care plan on multidisciplinary rounds  Merton Border, MD PCCM service Mobile [phone] Pager [phone]  04/09/2016    

## 2016-04-09 NOTE — Progress Notes (Signed)
PRE TREATMENT

## 2016-04-09 NOTE — Progress Notes (Signed)
Pt increasingly anxious, pulled all leads off, reaching for ET tube, pulled off mitts, Precedex increased, but pt remains very anxious, Fentyl IV  Push given

## 2016-04-09 NOTE — Progress Notes (Signed)
ANTICOAGULATION CONSULT NOTE - Initial Consult  Pharmacy Consult for Heparin Indication: pulmonary embolus  Allergies  Allergen Reactions  . Codeine Hives  . Sulfa Antibiotics Hives   Patient Measurements: Height: 5' 5"  (165.1 cm) Weight: (!) 325 lb 13.4 oz (147.8 kg) IBW/kg (Calculated) : 57 Heparin Dosing Weight: 94.2 kg  Vital Signs: Temp: 98.4 F (36.9 C) (04/29 0300) Temp Source: Other (Comment) (04/28 1600) BP: 136/64 mmHg (04/29 0300) Pulse Rate: 53 (04/29 0300)  Labs:  Recent Labs  04/07/16 0505  04/08/16 0539 04/08/16 0816 04/08/16 1907 04/09/16 0307  HGB 7.4*  --  7.2*  --   --  7.9*  HCT 23.3*  --  22.8*  --   --  24.5*  PLT 212  --  211  --   --  225  HEPARINUNFRC  --   < >  --  0.29* 0.48 0.52  CREATININE 4.45*  --  4.01*  --   --  3.28*  < > = values in this interval not displayed.  Estimated Creatinine Clearance: 27.5 mL/min (by C-G formula based on Cr of 3.28).   Medical History: History reviewed. No pertinent past medical history.  Medications:  Scheduled:  . antiseptic oral rinse  7 mL Mouth Rinse 10 times per day  . budesonide (PULMICORT) nebulizer solution  0.25 mg Nebulization 4 times per day  . chlorhexidine gluconate (SAGE KIT)  15 mL Mouth Rinse BID  . feeding supplement (PRO-STAT SUGAR FREE 64)  30 mL Per Tube QID  . fentaNYL (SUBLIMAZE) injection  50 mcg Intravenous Once  . insulin aspart  0-15 Units Subcutaneous 6 times per day  . ipratropium-albuterol  3 mL Nebulization Q6H  . levothyroxine  37.5 mcg Intravenous Daily  . meropenem (MERREM) IV  500 mg Intravenous Q24H  . pantoprazole (PROTONIX) IV  40 mg Intravenous QHS  . senna-docusate  1 tablet Oral Daily   Infusions:  . dexmedetomidine 1.2 mcg/kg/hr (04/09/16 0304)  . feeding supplement (VITAL HIGH PROTEIN) 1,000 mL (04/08/16 2343)  . fentaNYL infusion INTRAVENOUS Stopped (03/29/16 0837)  . heparin 2,400 Units/hr (04/08/16 2257)  . midazolam (VERSED) infusion Stopped  (03/29/16 0837)  . norepinephrine Stopped (03/28/16 2331)    Assessment: 59 y/o F admitted with respiratory arrest ordered heparin for possible PE. Per MD, Cannot rule out PE as patient cannot receive contrast due to AKI.   Goal of Therapy:  Heparin level 0.3-0.7 units/ml Monitor platelets by anticoagulation protocol: Yes   Plan:  Heparin level therapeutic. Continue current rate. Pharmacy will monitor daily.   Laural Benes, Pharm.D., BCPS Clinical Pharmacist 04/09/2016,3:57 AM

## 2016-04-09 NOTE — Progress Notes (Signed)
Pharmacy Antibiotic Note  Tasha Long is a 60 y.o. female admitted on 03/25/2016 with respiratory distress.   Pharmacy has been consulted for vancomycin and meropenem for MRSA PNA and ESBL E. Coli UTI. Now with AKI.  Patient is currently on day #16 vancomycin and day #13 of meropenem. Patient now with AKI on CKD and on intermittent HD. Patient's renal function continues to improve however.   Plan:  Vancomycin level post-HD last PM was 10 mcg/ml. Patient given 1000 mg vancomycin x 1. Based on VD of 84 L, this dose should have gotten vancomycin level to ~ 22 mcg/ml. Patient receiving short session of HD today per nephrology of about 3 h. BFR currently at 300 so expect vancomycin level to fall below 15 mcg/ml after this session. Will order an additional dose of vancomycin 1000 mg post-HD. Will need to continue to follow dialysis sessions and order vancomycin/check random levels as indicated.   4/29: Will order next random to be drawn @ 12:00 on 4/30. ~48 hours post dose.   Continue meropenem 500mg  IV Q24H to be given after dialysis on HD days.      Height: 5\' 5"  (165.1 cm) Weight: (!) 325 lb 13.4 oz (147.8 kg) IBW/kg (Calculated) : 57  Temp (24hrs), Avg:98.4 F (36.9 C), Min:97.9 F (36.6 C), Max:99 F (37.2 C)   Recent Labs Lab 04/04/16 0428  04/05/16 0416 04/05/16 0731 04/06/16 0516 04/07/16 0505 04/07/16 1837 04/08/16 0539 04/09/16 0307  WBC 8.3  --  8.5  --  9.0 10.0  --  8.0 8.5  CREATININE  --   < > 5.00*  5.03*  --  5.24* 4.45*  --  4.01* 3.28*  VANCOTROUGH  --   --   --  18  --   --   --   --   --   VANCORANDOM 19  --   --   --   --   --  10  --   --   < > = values in this interval not displayed.  Estimated Creatinine Clearance: 27.5 mL/min (by C-G formula based on Cr of 3.28).    Allergies  Allergen Reactions  . Codeine Hives  . Sulfa Antibiotics Hives    Antimicrobials this admission: Zosyn 4/14 >> 4/14 vancomycin 4/14 >>  Cefepime 4/14 >>  4/17 Metronidazole 4/14 >> 4/17 Meropenem 4/17 >>  Dose adjustments this admission: Merrem 500 mg IV q24 hours 4/18   Microbiology results: 4/19 TA: MRSA 4/14 BCx: negative UCx: 20k ESBL E coli MRSA PCR: positive  Thank you for allowing pharmacy to be a part of this patient's care.  Kalden Wanke D, Pharm.D. Clinical Pharmacist 04/09/2016 9:23 AM

## 2016-04-10 ENCOUNTER — Inpatient Hospital Stay: Payer: Medicaid Other

## 2016-04-10 LAB — COMPREHENSIVE METABOLIC PANEL
ALK PHOS: 34 U/L — AB (ref 38–126)
ALT: 12 U/L — AB (ref 14–54)
AST: 19 U/L (ref 15–41)
Albumin: 2.1 g/dL — ABNORMAL LOW (ref 3.5–5.0)
Anion gap: 11 (ref 5–15)
BILIRUBIN TOTAL: 0.4 mg/dL (ref 0.3–1.2)
BUN: 62 mg/dL — AB (ref 6–20)
CALCIUM: 8.8 mg/dL — AB (ref 8.9–10.3)
CHLORIDE: 103 mmol/L (ref 101–111)
CO2: 28 mmol/L (ref 22–32)
CREATININE: 2.76 mg/dL — AB (ref 0.44–1.00)
GFR, EST AFRICAN AMERICAN: 21 mL/min — AB (ref >60)
GFR, EST NON AFRICAN AMERICAN: 18 mL/min — AB (ref >60)
Glucose, Bld: 126 mg/dL — ABNORMAL HIGH (ref 65–99)
Potassium: 4 mmol/L (ref 3.5–5.1)
Sodium: 142 mmol/L (ref 135–145)
TOTAL PROTEIN: 6.5 g/dL (ref 6.5–8.1)

## 2016-04-10 LAB — CBC
HCT: 24.6 % — ABNORMAL LOW (ref 35.0–47.0)
Hemoglobin: 7.6 g/dL — ABNORMAL LOW (ref 12.0–16.0)
MCH: 23.7 pg — ABNORMAL LOW (ref 26.0–34.0)
MCHC: 30.8 g/dL — ABNORMAL LOW (ref 32.0–36.0)
MCV: 77 fL — ABNORMAL LOW (ref 80.0–100.0)
PLATELETS: 224 10*3/uL (ref 150–440)
RBC: 3.2 MIL/uL — AB (ref 3.80–5.20)
RDW: 18.7 % — AB (ref 11.5–14.5)
WBC: 7.5 10*3/uL (ref 3.6–11.0)

## 2016-04-10 LAB — GLUCOSE, CAPILLARY
GLUCOSE-CAPILLARY: 103 mg/dL — AB (ref 65–99)
GLUCOSE-CAPILLARY: 115 mg/dL — AB (ref 65–99)
GLUCOSE-CAPILLARY: 93 mg/dL (ref 65–99)
Glucose-Capillary: 104 mg/dL — ABNORMAL HIGH (ref 65–99)
Glucose-Capillary: 100 mg/dL — ABNORMAL HIGH (ref 65–99)

## 2016-04-10 LAB — HEPARIN LEVEL (UNFRACTIONATED): HEPARIN UNFRACTIONATED: 0.49 [IU]/mL (ref 0.30–0.70)

## 2016-04-10 LAB — PROCALCITONIN: PROCALCITONIN: 0.36 ng/mL

## 2016-04-10 MED ORDER — PANTOPRAZOLE SODIUM 40 MG IV SOLR
40.0000 mg | INTRAVENOUS | Status: DC
  Administered 2016-04-10 – 2016-04-15 (×6): 40 mg via INTRAVENOUS
  Filled 2016-04-10 (×6): qty 40

## 2016-04-10 MED ORDER — INSULIN ASPART 100 UNIT/ML ~~LOC~~ SOLN: 0.0000 [IU] | SUBCUTANEOUS | Status: DC

## 2016-04-10 MED ORDER — LEVOTHYROXINE SODIUM 100 MCG IV SOLR
37.5000 ug | Freq: Every day | INTRAVENOUS | Status: DC
  Administered 2016-04-11 – 2016-04-15 (×5): 37.5 ug via INTRAVENOUS
  Filled 2016-04-10 (×5): qty 5

## 2016-04-10 MED ORDER — FENTANYL CITRATE (PF) 100 MCG/2ML IJ SOLN
25.0000 ug | Freq: Once | INTRAMUSCULAR | Status: AC
  Administered 2016-04-10: 25 ug via INTRAVENOUS
  Filled 2016-04-10: qty 2

## 2016-04-10 MED ORDER — FENTANYL CITRATE (PF) 100 MCG/2ML IJ SOLN
12.5000 ug | INTRAMUSCULAR | Status: DC | PRN
  Administered 2016-04-10 – 2016-04-15 (×16): 25 ug via INTRAVENOUS
  Filled 2016-04-10 (×16): qty 2

## 2016-04-10 MED ORDER — MIDAZOLAM HCL 2 MG/2ML IJ SOLN
INTRAMUSCULAR | Status: AC
  Administered 2016-04-10: 2 mg
  Filled 2016-04-10: qty 2

## 2016-04-10 MED ORDER — DEXMEDETOMIDINE HCL IN NACL 400 MCG/100ML IV SOLN
0.0000 ug/kg/h | INTRAVENOUS | Status: DC
  Administered 2016-04-10 (×2): 0.4 ug/kg/h via INTRAVENOUS
  Administered 2016-04-11: 0.5 ug/kg/h via INTRAVENOUS
  Administered 2016-04-11: 0.35 ug/kg/h via INTRAVENOUS
  Administered 2016-04-12 (×2): 0.5 ug/kg/h via INTRAVENOUS
  Administered 2016-04-12: 0.4 ug/kg/h via INTRAVENOUS
  Administered 2016-04-12 (×2): 0.5 ug/kg/h via INTRAVENOUS
  Administered 2016-04-13: 0.04 ug/kg/h via INTRAVENOUS
  Administered 2016-04-14 (×2): 0.4 ug/kg/h via INTRAVENOUS
  Administered 2016-04-14 – 2016-04-15 (×2): 0.5 ug/kg/h via INTRAVENOUS
  Administered 2016-04-15: 0.4 ug/kg/h via INTRAVENOUS
  Filled 2016-04-10 (×22): qty 100

## 2016-04-10 MED ORDER — DEXMEDETOMIDINE HCL IN NACL 400 MCG/100ML IV SOLN: 0.0000 ug/kg/h | INTRAVENOUS | Status: DC

## 2016-04-10 MED ORDER — MIDAZOLAM HCL 2 MG/2ML IJ SOLN
1.0000 mg | INTRAMUSCULAR | Status: DC | PRN
  Administered 2016-04-10: 2 mg via INTRAVENOUS
  Filled 2016-04-10: qty 2

## 2016-04-10 NOTE — Progress Notes (Signed)
Placed on high fowlers position cuff deflated suctioned orally and endotracheally and then extubated to 4 lpm O2 [place]. Tolerating well

## 2016-04-10 NOTE — Progress Notes (Signed)
PULMONARY / CRITICAL CARE MEDICINE   Name: Tasha Long MRN: [id number] DOB: [birth year]    ADMISSION DATE:  03/25/2016     PT PROFILE:  4 F in chronically very poor health developed abdominal pain and EMS was dispatched. Suffered respiratory arrest and brief cardiac arrest in the transfer from her bed to wheelchair and into ambulance. Intubated and transported to ED. Suffered brief PEA arrest again in ICU shortly after arrival  SUBJECTIVE:  Patient was dialysed yesterday. SHe was able to tolerate  2 hours of 5/5 weaning  Yesterday.   MAJOR EVENTS/TEST RESULTS: 04/14 TTE: 0000000, G1 diastrolic dysfcn, mild LA dil, mod RV dil, mild RA Dil. 4 chamber dilation, with mod sys dysfcn and mild diastolic dysfcn w/diffuse hypokinesis 04/14 LE venous US: poor quality study due to body habitus. No DVT seen 04/14 abdomen US: IMPRESSION: 1. No explanation for abdominal pain. Bowel gas obscures the lower abdominal aorta. Cholecystectomy. 04/15 CT chest (wihtout contrast): Severe multilobar bronchopneumonia throughout the lungs bilaterally. Multiple borderline enlarged and minimally enlarged mediastinal and bilateral hilar lymph nodes, presumably reactive. Mild dilatation of the pulmonic trunk (3.5 cm in diameter), suggestive of pulmonary arterial hypertension 04/15 CTAP: No acute pathology 04/16 Renal Consultation 04/17 CT head: Minimal diffuse cortical atrophy. Mild chronic ischemic white matter disease. Mild bilateral maxillary and ethmoid sinusitis. No acute intracranial abnormality seen 04/18 EEG: EEG secondary to general background slowing. This finding may be seen with a diffuse disturbance that is etiologically nonspecific, but may include a metabolic encephalopathy, among other possibilities. No epileptiform activity was noted 04/19 Neurology consultation: Prognosis for neurological recovery deemed guarded 04/21 intermittent HD initiated 04/26 received one unit PRBCs for Hgb  6.9 04/27 ENT consultation for planned trach tube placement 04/30 Dramatic improvement in neurologic function. RASS 0. + F/C. Passed SBT and extubated. Tolerated initially  INDWELLING DEVICES:: ETT 04/14 >> 04/30 R [place] CVL 04/14 >>  L IJ HD cath 04/17 >>   MICRO DATA: MRSA PCR 04/14 >> POS Urine 04/14 >> ESBL E coli (sens only to imipenem, pip-tazo, gent) Resp 04/14 >> few gram negative rods  Blood 04/14 >> NEG  Resp 04/19>>  MRSA  PCT algorithm 04/14-04/16: < 0.10, 1.48, 1.59 PCT 04/30: 0.36  ANTIMICROBIALS:  Zosyn 4/14 >> 4/14 Cefepime 4/14 >> 4/17 Metronidazole 4/14 >> 4/17 Meropenem 4/17 >> 04/29 vancomycin 4/14 >> 04/29  SUBJ: RASS 0. + F/C  VITAL SIGNS: Temp:  [97.3 F (36.3 C)-98.4 F (36.9 C)] 97.3 F (36.3 C) (04/30 1300) Pulse Rate:  [43-100] 59 (04/30 1300) Resp:  [0-38] 27 (04/30 1300) BP: (111-145)/(54-86) 129/57 mmHg (04/30 1200) SpO2:  [97 %-100 %] 99 % (04/30 1327) FiO2 (%):  [35 %] 35 % (04/30 1045) Weight:  [147.8 kg (325 lb 13.4 oz)] 147.8 kg (325 lb 13.4 oz) (04/30 0500) HEMODYNAMICS:   VENTILATOR SETTINGS: Vent Mode:  [-] PSV FiO2 (%):  [35 %] 35 % Set Rate:  [28 bmp] 28 bmp Vt Set:  [500 mL] 500 mL PEEP:  [5 cmH20] 5 cmH20 Pressure Support:  [5 cmH20] 5 cmH20 INTAKE / OUTPUT:  Intake/Output Summary (Last 24 hours) at 04/10/16 1523 Last data filed at 04/10/16 1517  Gross per 24 hour  Intake 1925.58 ml  Output   1890 ml  Net  35.58 ml    Review of Systems  Unable to perform ROS: intubated    Physical Exam General Appearance: obese, intubated, RASS 0, + F/C briskly Neuro: EOMI, PERRL, MAEs, diffusely weak, very HOH HEENT:  NCAT Pulmonary: Clear anteriorly  Cardiovascular: regular, no M noted Abdomen Obese, soft, mildly tender in BUQs, + BS Skin: chronic LE venous stasis changed Extremities: brawny and pittiing edema in BLE   LABS:  CBC  Recent Labs Lab 04/08/16 0539 04/09/16 0307 04/10/16 0435  WBC 8.0 8.5  7.5  HGB 7.2* 7.9* 7.6*  HCT 22.8* 24.5* 24.6*  PLT 211 225 224   Coag's No results for input(s): APTT, INR in the last 168 hours. BMET  Recent Labs Lab 04/08/16 0539 04/09/16 0307 04/10/16 0426  NA 140 141 142  K 4.0 4.2 4.0  CL 102 102 103  CO2 28 27 28   BUN 80* 67* 62*  CREATININE 4.01* 3.28* 2.76*  GLUCOSE 125* 125* 126*   Electrolytes  Recent Labs Lab 04/04/16 1138 04/05/16 0416  04/08/16 0539 04/09/16 0307 04/10/16 0426  CALCIUM 9.3 9.3  9.3  < > 8.6* 8.8* 8.8*  PHOS 5.5* 6.5*  --   --   --   --   < > = values in this interval not displayed. Sepsis Markers  Recent Labs Lab 04/10/16 0426  PROCALCITON 0.36   ABG  Recent Labs Lab 04/07/16 0400 04/08/16 0445 04/09/16 0350  PHART 7.39 7.34* 7.48*  PCO2ART 52* 56* 43  PO2ART 105 114* 144*   Liver Enzymes  Recent Labs Lab 04/04/16 1138 04/05/16 0416 04/10/16 0426  AST  --   --  19  ALT  --   --  12*  ALKPHOS  --   --  34*  BILITOT  --   --  0.4  ALBUMIN 2.4* 2.4* 2.1*   Cardiac Enzymes No results for input(s): TROPONINI, PROBNP in the last 168 hours. Glucose  Recent Labs Lab 04/09/16 1531 04/09/16 1945 04/09/16 2336 04/10/16 0327 04/10/16 0728 04/10/16 1204  GLUCAP 105* 124* 113* 103* 115* 104*    CXR: [place]   ASSESSMENT / PLAN:  PULMONARY A: S/P respiratory arrest COPD, Smoker PTA OSA Concern for PE based on RFs and initial presentation Prolonged VDRF P:  Monitor in ICU post extubation Supplemental O2 to maintain SpO2 > 90 % PRN BiPAP during day Mandatory BiPAP @ night I have contacted ENT and informed them that she has been extubated  CARDIOVASCULAR A:  Cardiac arrest due to respiratory arrest Hypertension, controlled Hyperlipidemia P:  Monitor BP and rhythm MAP goal > 65 mmhg  RENAL A:  AKI, oliguric > nonoliguric Hyperkalemia, resolved P:  Monitor BMET intermittently Monitor I/Os Correct electrolytes as indicated Cont IHD per renal  service  GASTROINTESTINAL A:  Morbid obesity Abdominal pain, nonspecific - no clear etiology Diarrhea Post extubation dysphagia P:  SUP: IV PPI NPO post extubation SLP eval ordered for 05/01  HEMATOLOGIC A:  ICU acquired anemia with microcytosis - no overt bleeding P:  DVT px: full dose heparin Monitor CBC intermittently Transfuse per usual guidelines  INFECTIOUS A:  MRSA PNA - fully treated Possible LE cellulitis - treated Prior hx of MRSA cellulitis ESBL in the urine - treated P:  Monitor temp, WBC count Micro and abx as above  ENDOCRINE A:  Mild hyperglycemia without prior history of DM Hypothyroidism P:  Change levothyroxine to IV until able to take PO meds Cont SSI - change to sens scale while NPO  NEUROLOGIC A:  Post arrest anoxic encephalopathy, much improved ICU associated discomfort Intermittent agitation Severe deconditioning P:  RASS goal: 0 Continue dexmedetomidine  Minimize sedating meds  PT eval ordered for 05/01   Family updated @  bedside and pt checked on multiple occasions after extubation   CCM time: 45 mins  The above time includes time spent in consultation with patient and/or family members and reviewing care plan on multidisciplinary rounds  Merton Border, MD PCCM service Mobile [phone] Pager [phone]  04/10/2016

## 2016-04-10 NOTE — Progress Notes (Signed)
ANTICOAGULATION CONSULT NOTE - Initial Consult  Pharmacy Consult for Heparin Indication: pulmonary embolus  Allergies  Allergen Reactions  . Codeine Hives  . Sulfa Antibiotics Hives   Patient Measurements: Height: 5' 5" (165.1 cm) Weight: (!) 325 lb 13.4 oz (147.8 kg) IBW/kg (Calculated) : 57 Heparin Dosing Weight: 94.2 kg  Vital Signs: Temp: 97.5 F (36.4 C) (04/30 0200) BP: 132/66 mmHg (04/30 0200) Pulse Rate: 52 (04/30 0200)  Labs:  Recent Labs  04/08/16 0539  04/08/16 1907 04/09/16 0307 04/10/16 0426 04/10/16 0435  HGB 7.2*  --   --  7.9*  --  7.6*  HCT 22.8*  --   --  24.5*  --  24.6*  PLT 211  --   --  225  --  224  HEPARINUNFRC  --   < > 0.48 0.52 0.49  --   CREATININE 4.01*  --   --  3.28* 2.76*  --   < > = values in this interval not displayed.  Estimated Creatinine Clearance: 32.7 mL/min (by C-G formula based on Cr of 2.76).   Medical History: History reviewed. No pertinent past medical history.  Medications:  Scheduled:  . antiseptic oral rinse  7 mL Mouth Rinse 10 times per day  . budesonide (PULMICORT) nebulizer solution  0.25 mg Nebulization 4 times per day  . chlorhexidine gluconate (SAGE KIT)  15 mL Mouth Rinse BID  . feeding supplement (PRO-STAT SUGAR FREE 64)  30 mL Per Tube QID  . insulin aspart  0-15 Units Subcutaneous 6 times per day  . ipratropium-albuterol  3 mL Nebulization Q6H  . levothyroxine  75 mcg Per Tube QAC breakfast  . pantoprazole sodium  40 mg Per Tube Q1200   Infusions:  . dexmedetomidine 0.4 mcg/kg/hr (04/10/16 0338)  . feeding supplement (VITAL HIGH PROTEIN) 1,000 mL (04/10/16 0401)  . heparin 2,400 Units/hr (04/09/16 2124)    Assessment: 59 y/o F admitted with respiratory arrest ordered heparin for possible PE. Per MD, Cannot rule out PE as patient cannot receive contrast due to AKI.   Goal of Therapy:  Heparin level 0.3-0.7 units/ml Monitor platelets by anticoagulation protocol: Yes   Plan:  Heparin level  therapeutic. Continue current rate. Pharmacy will monitor daily.    A , Pharm.D., BCPS Clinical Pharmacist 04/10/2016,5:35 AM   

## 2016-04-10 NOTE — Progress Notes (Signed)
[facility]  ROUNDING NOTE   Subjective:   HD was 4/22, 4/26,4/27, 4/28  UOP 980 cc last 24 hrs. 300 cc emptied from foley this AM.  + dependent edema Following commands this AM Remains ventilator dependent    Objective:  Vital signs in last 24 hours:  Temp:  [97.3 F (36.3 C)-98.4 F (36.9 C)] 97.7 F (36.5 C) (04/30 0800) Pulse Rate:  [43-102] 52 (04/30 0800) Resp:  [0-38] 22 (04/30 0800) BP: (111-146)/(47-86) 111/86 mmHg (04/30 0800) SpO2:  [84 %-100 %] 100 % (04/30 0830) FiO2 (%):  [35 %] 35 % (04/30 0830) Weight:  [147.8 kg (325 lb 13.4 oz)] 147.8 kg (325 lb 13.4 oz) (04/30 0500)  Weight change: 0 kg (0 lb) Filed Weights   04/08/16 0500 04/08/16 1030 04/10/16 0500  Weight: 147.873 kg (326 lb) 147.8 kg (325 lb 13.4 oz) 147.8 kg (325 lb 13.4 oz)    Intake/Output: I/O last 3 completed shifts: In: 2630 [I.V.:2290; NG/GT:240; IV Piggyback:100] Out: 1125 [Urine:625; Other:500]   Intake/Output this shift:     Physical Exam: General: Critically ill  Head: ETT OGT  Eyes: Eyes open, tracking  Neck: trachea midline  Lungs:  Coarse breath sounds, vent assisted  Heart: Regular rate and rhythm  Abdomen:  Soft, nontender, obese, no bowel sounds  Extremities: + peripheral edema.  Neurologic:  intubated, trying to follow commands  Skin: Erythema bilateral lower extremities  Access: Left IJ vascath 4/17 Dr. Mortimer Fries    Basic Metabolic Panel:  Recent Labs Lab 04/04/16 1138 04/05/16 0416 04/06/16 5625 04/07/16 0505 04/08/16 0539 04/09/16 0307 04/10/16 0426  NA 140 140  140 139 142 140 141 142  K 4.0 4.2  4.2 4.4 4.1 4.0 4.2 4.0  CL 102 103  104 101 104 102 102 103  CO2 28 30  30 27 30 28 27 28   GLUCOSE 95 104*  103* 115* 121* 125* 125* 126*  BUN 72* 84*  85* 98* 91* 80* 67* 62*  CREATININE 4.74* 5.00*  5.03* 5.24* 4.45* 4.01* 3.28* 2.76*  CALCIUM 9.3 9.3  9.3 9.0 8.5* 8.6* 8.8* 8.8*  PHOS 5.5* 6.5*  --   --   --   --   --     Liver  Function Tests:  Recent Labs Lab 04/04/16 1138 04/05/16 0416 04/10/16 0426  AST  --   --  19  ALT  --   --  12*  ALKPHOS  --   --  34*  BILITOT  --   --  0.4  PROT  --   --  6.5  ALBUMIN 2.4* 2.4* 2.1*   No results for input(s): LIPASE, AMYLASE in the last 168 hours. No results for input(s): AMMONIA in the last 168 hours.  CBC:  Recent Labs Lab 04/05/16 0416 04/06/16 0516 04/07/16 0505 04/08/16 0539 04/09/16 0307 04/10/16 0435  WBC 8.5 9.0 10.0 8.0 8.5 7.5  NEUTROABS 5.7  --   --   --   --   --   HGB 7.0* 6.9* 7.4* 7.2* 7.9* 7.6*  HCT 22.8* 22.1* 23.3* 22.8* 24.5* 24.6*  MCV 74.7* 74.6* 75.1* 75.0* 76.5* 77.0*  PLT 189 199 212 211 225 224    Cardiac Enzymes: No results for input(s): CKTOTAL, CKMB, CKMBINDEX, TROPONINI in the last 168 hours.  BNP: Invalid input(s): POCBNP  CBG:  Recent Labs Lab 04/09/16 1531 04/09/16 1945 04/09/16 2336 04/10/16 0327 04/10/16 0728  GLUCAP 105* 124* 113* 103* 115*    Microbiology: Results  for orders placed or performed during the hospital encounter of 03/25/16  Culture, blood (routine x 2)     Status: None   Collection Time: 03/25/16  7:19 AM  Result Value Ref Range Status   Specimen Description BLOOD LEFT FOREARM  Final   Special Requests BOTTLES DRAWN AEROBIC AND ANAEROBIC  1CC  Final   Culture NO GROWTH 5 DAYS  Final   Report Status 03/30/2016 FINAL  Final  Culture, blood (routine x 2)     Status: None   Collection Time: 03/25/16  7:19 AM  Result Value Ref Range Status   Specimen Description BLOOD LEFT HAND  Final   Special Requests BOTTLES DRAWN AEROBIC AND ANAEROBIC  1CC  Final   Culture NO GROWTH 5 DAYS  Final   Report Status 03/30/2016 FINAL  Final  MRSA PCR Screening     Status: Abnormal   Collection Time: 03/25/16 12:42 PM  Result Value Ref Range Status   MRSA by PCR POSITIVE (A) NEGATIVE Final    Comment:        The GeneXpert MRSA Assay (FDA approved for NASAL specimens only), is one component of  a comprehensive MRSA colonization surveillance program. It is not intended to diagnose MRSA infection nor to guide or monitor treatment for MRSA infections. CRITICAL RESULT CALLED TO, READ BACK BY AND VERIFIED WITH:  AMELIA BERRY AT 1644 03/25/16 SDR   Urine culture     Status: Abnormal   Collection Time: 03/26/16  1:02 AM  Result Value Ref Range Status   Specimen Description URINE, RANDOM  Final   Special Requests NONE  Final   Culture (A)  Final    20,000 COLONIES/mL ESCHERICHIA COLI Results Called to: AMELIA BERRY, RN  AT 1055 ON 03/28/16 BY CTJ. ESBL-EXTENDED SPECTRUM BETA LACTAMASE-THE ORGANISM IS RESISTANT TO PENICILLINS, CEPHALOSPORINS AND AZTREONAM ACCORDING TO CLSI M100-S15 VOL.[place].    Report Status 03/28/2016 FINAL  Final   Organism ID, Bacteria ESCHERICHIA COLI (A)  Final      Susceptibility   Escherichia coli - MIC*    AMPICILLIN >=32 RESISTANT Resistant     CEFAZOLIN >=64 RESISTANT Resistant     CEFTRIAXONE >=64 RESISTANT Resistant     CIPROFLOXACIN >=4 RESISTANT Resistant     GENTAMICIN <=1 SENSITIVE Sensitive     IMIPENEM <=0.25 SENSITIVE Sensitive     NITROFURANTOIN <=16 SENSITIVE Sensitive     TRIMETH/SULFA >=320 RESISTANT Resistant     AMPICILLIN/SULBACTAM 16 INTERMEDIATE Intermediate     PIP/TAZO <=4 SENSITIVE Sensitive     Extended ESBL POSITIVE Resistant     * 20,000 COLONIES/mL ESCHERICHIA COLI  Culture, respiratory (tracheal aspirate)     Status: None   Collection Time: 03/30/16  8:50 AM  Result Value Ref Range Status   Specimen Description TRACHEAL ASPIRATE  Final   Special Requests NONE  Final   Gram Stain FEW WBC SEEN RARE GRAM NEGATIVE RODS   Final   Culture   Final    MODERATE GROWTH METHICILLIN RESISTANT STAPHYLOCOCCUS AUREUS LIGHT GROWTH OCHROBACTRUM ANTHROPI MODERATE GROWTH GRAM NEGATIVE RODS UNABLE TO FURTHER ID BETA LACTAMASE POSITIVE    Report Status 04/05/2016 FINAL  Final   Organism ID, Bacteria METHICILLIN RESISTANT  STAPHYLOCOCCUS AUREUS  Final      Susceptibility   Methicillin resistant staphylococcus aureus - MIC*    CIPROFLOXACIN <=0.5 SENSITIVE Sensitive     ERYTHROMYCIN >=8 RESISTANT Resistant     GENTAMICIN <=0.5 SENSITIVE Sensitive     OXACILLIN >=4  RESISTANT Resistant     TETRACYCLINE 2 SENSITIVE Sensitive     VANCOMYCIN 1 SENSITIVE Sensitive     TRIMETH/SULFA >=320 RESISTANT Resistant     CLINDAMYCIN >=8 RESISTANT Resistant     RIFAMPIN <=0.5 SENSITIVE Sensitive     Inducible Clindamycin NEGATIVE Sensitive     * MODERATE GROWTH METHICILLIN RESISTANT STAPHYLOCOCCUS AUREUS    Coagulation Studies: No results for input(s): LABPROT, INR in the last 72 hours.  Urinalysis: No results for input(s): COLORURINE, LABSPEC, PHURINE, GLUCOSEU, HGBUR, BILIRUBINUR, KETONESUR, PROTEINUR, UROBILINOGEN, NITRITE, LEUKOCYTESUR in the last 72 hours.  Invalid input(s): APPERANCEUR    Imaging: Dg Chest 1 View  04/09/2016  CLINICAL DATA:  CCU PT, Dyspnea EXAM: CHEST 1 VIEW COMPARISON:  04/08/2016 FINDINGS: Stable cardiac enlargement and support devices. Mild diffuse interstitial prominence. No definite pulmonary edema. No consolidation or effusion. IMPRESSION: No significant change. Electronically Signed   By: Skipper Cliche M.D.   On: 04/09/2016 08:13   Dg Chest Port 1 View  04/10/2016  CLINICAL DATA:  Patient is on a bipap machine and is being seen for respiratory failure. Hx obesity, former smoker EXAM: PORTABLE CHEST 1 VIEW COMPARISON:  04/09/2016 FINDINGS: Support devices unchanged. Stable mild cardiac silhouette enlargement and mild vascular congestion. Mild diffuse interstitial prominence without definite pulmonary edema. No consolidation. Inferior costophrenic angles excluded from the image. IMPRESSION: No significant change from prior radiograph. Electronically Signed   By: Skipper Cliche M.D.   On: 04/10/2016 07:30     Medications:   . dexmedetomidine    . feeding supplement (VITAL HIGH PROTEIN)  1,000 mL (04/10/16 0800)  . heparin 24 Units (04/10/16 0800)   . antiseptic oral rinse  7 mL Mouth Rinse 10 times per day  . budesonide (PULMICORT) nebulizer solution  0.25 mg Nebulization 4 times per day  . chlorhexidine gluconate (SAGE KIT)  15 mL Mouth Rinse BID  . feeding supplement (PRO-STAT SUGAR FREE 64)  30 mL Per Tube QID  . insulin aspart  0-15 Units Subcutaneous 6 times per day  . ipratropium-albuterol  3 mL Nebulization Q6H  . levothyroxine  75 mcg Per Tube QAC breakfast  . pantoprazole sodium  40 mg Per Tube Q1200   sodium chloride, acetaminophen, albuterol, fentaNYL, midazolam, ondansetron (ZOFRAN) IV, sodium chloride flush  Assessment/ Plan:  Ms. Tasha Long is a 60 y.o. 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   1. Acute renal failure on chronic kidney disease stage III with baseline creatinine of 1.2, eGFR of 49:   Secondary to ATN. With elevated vancomycin levels.    - UOP fair  - HD was 4/22, 4/26,4/27, 4/28 - Dose meds for CrCl < 30 - Avoid nephrotoxic agents.   2. Acute respiratory failure:  intubated on mechanical ventilation. with multilobar pneumonia.  Possible extubation today  3. Sepsis/Hypotension secondary to ESBL E. Coli Urinary tract infection and pneumonia (sputum with MRSA):  no longer on vasopressors.    4. Generalized edema - fluid removal with HD, UOP improving some - Volume status is difficult to determine due to body habitus   5. Morbid obesity - baseline functional status - Bed to chair    LOS: [place] 4/30/201710:46 AM

## 2016-04-10 NOTE — Progress Notes (Signed)
Pt was extubated earlier today, was doing well on [place] at 2 L, RN gave her a bath, she became very anxious sats dropped , 02 INC to 4 L , reassured, medicated for pain , unable to sit and dangle at this time due to anxiety. But she is working on feet and arms pumps, is following commands

## 2016-04-11 DIAGNOSIS — I469 Cardiac arrest, cause unspecified: Secondary | ICD-10-CM | POA: Insufficient documentation

## 2016-04-11 LAB — CBC
HEMATOCRIT: 23.6 % — AB (ref 35.0–47.0)
Hemoglobin: 7.3 g/dL — ABNORMAL LOW (ref 12.0–16.0)
MCH: 23.1 pg — AB (ref 26.0–34.0)
MCHC: 30.9 g/dL — AB (ref 32.0–36.0)
MCV: 74.8 fL — AB (ref 80.0–100.0)
Platelets: 231 10*3/uL (ref 150–440)
RBC: 3.16 MIL/uL — ABNORMAL LOW (ref 3.80–5.20)
RDW: 18.7 % — AB (ref 11.5–14.5)
WBC: 6.6 10*3/uL (ref 3.6–11.0)

## 2016-04-11 LAB — GLUCOSE, CAPILLARY
Glucose-Capillary: 101 mg/dL — ABNORMAL HIGH (ref 65–99)
Glucose-Capillary: 103 mg/dL — ABNORMAL HIGH (ref 65–99)
Glucose-Capillary: 87 mg/dL (ref 65–99)
Glucose-Capillary: 91 mg/dL (ref 65–99)
Glucose-Capillary: 96 mg/dL (ref 65–99)
Glucose-Capillary: 96 mg/dL (ref 65–99)

## 2016-04-11 LAB — BASIC METABOLIC PANEL
ANION GAP: 11 (ref 5–15)
BUN: 73 mg/dL — AB (ref 6–20)
CALCIUM: 8.9 mg/dL (ref 8.9–10.3)
CO2: 27 mmol/L (ref 22–32)
Chloride: 103 mmol/L (ref 101–111)
Creatinine, Ser: 3.19 mg/dL — ABNORMAL HIGH (ref 0.44–1.00)
GFR calc Af Amer: 17 mL/min — ABNORMAL LOW (ref >60)
GFR calc non Af Amer: 15 mL/min — ABNORMAL LOW (ref >60)
GLUCOSE: 108 mg/dL — AB (ref 65–99)
Potassium: 4 mmol/L (ref 3.5–5.1)
Sodium: 141 mmol/L (ref 135–145)

## 2016-04-11 LAB — HEPARIN LEVEL (UNFRACTIONATED): Heparin Unfractionated: 0.57 IU/mL (ref 0.30–0.70)

## 2016-04-11 MED ORDER — HEPARIN SODIUM (PORCINE) 5000 UNIT/ML IJ SOLN
5000.0000 [IU] | Freq: Three times a day (TID) | INTRAMUSCULAR | Status: DC
  Administered 2016-04-11 – 2016-04-15 (×13): 5000 [IU] via SUBCUTANEOUS
  Filled 2016-04-11 (×13): qty 1

## 2016-04-11 NOTE — Progress Notes (Signed)
[facility]  ROUNDING NOTE   Subjective:  Patient has had for dialysis treatments. Creatinine trending up off of dialysis. Urine output over the preceding 24 hours however was 1.7 L.    Objective:  Vital signs in last 24 hours:  Temp:  [96.9 F (36.1 C)-97.7 F (36.5 C)] 97.6 F (36.4 C) (05/01 0800) Pulse Rate:  [46-94] 46 (05/01 0900) Resp:  [11-30] 16 (05/01 0900) BP: (112-135)/(50-79) 129/53 mmHg (05/01 0900) SpO2:  [90 %-100 %] 100 % (05/01 0900) FiO2 (%):  [30 %-35 %] 30 % (05/01 0800) Weight:  [153.769 kg (339 lb)] 153.769 kg (339 lb) (05/01 0500)  Weight change: 5.969 kg (13 lb 2.6 oz) Filed Weights   04/08/16 1030 04/10/16 0500 04/11/16 0500  Weight: 147.8 kg (325 lb 13.4 oz) 147.8 kg (325 lb 13.4 oz) 153.769 kg (339 lb)    Intake/Output: I/O last 3 completed shifts: In: 3268.4 [I.V.:3268.4] Out: 9678 [Urine:1725; Stool:100]   Intake/Output this shift:  Total I/O In: 74.8 [I.V.:74.8] Out: -   Physical Exam: General: Critically ill  Head: On bipap  Eyes: anicteric  Neck: trachea midline  Lungs:  Coarse breath sounds, on bipap  Heart: Regular rate and rhythm  Abdomen:  Soft, nontender, obese, no bowel sounds  Extremities: + peripheral edema.  Neurologic: Awake, not consistently following commands  Skin: Erythema bilateral lower extremities  Access: Left IJ vascath 4/17 Dr. Mortimer Fries    Basic Metabolic Panel:  Recent Labs Lab 04/04/16 1138 04/05/16 9381  04/07/16 0505 04/08/16 0539 04/09/16 0307 04/10/16 0426 04/11/16 0427  NA 140 140  140  < > 142 140 141 142 141  K 4.0 4.2  4.2  < > 4.1 4.0 4.2 4.0 4.0  CL 102 103  104  < > 104 102 102 103 103  CO2 _0 < > _1 GLUCOSE 95 104*  103*  < > 121* 125* 125* 126* 108*  BUN 72* 84*  85*  < > 91* 80* 67* 62* 73*  CREATININE 4.74* 5.00*  5.03*  < > 4.45* 4.01* 3.28* 2.76* 3.19*  CALCIUM 9.3 9.3  9.3  < > 8.5* 8.6* 8.8* 8.8* 8.9  PHOS 5.5* 6.5*  --   --   --   --    --   --   < > = values in this interval not displayed.  Liver Function Tests:  Recent Labs Lab 04/04/16 1138 04/05/16 0416 04/10/16 0426  AST  --   --  19  ALT  --   --  12*  ALKPHOS  --   --  34*  BILITOT  --   --  0.4  PROT  --   --  6.5  ALBUMIN 2.4* 2.4* 2.1*   No results for input(s): LIPASE, AMYLASE in the last 168 hours. No results for input(s): AMMONIA in the last 168 hours.  CBC:  Recent Labs Lab 04/05/16 0416  04/07/16 0505 04/08/16 0539 04/09/16 0307 04/10/16 0435 04/11/16 0427  WBC 8.5  < > 10.0 8.0 8.5 7.5 6.6  NEUTROABS 5.7  --   --   --   --   --   --   HGB 7.0*  < > 7.4* 7.2* 7.9* 7.6* 7.3*  HCT 22.8*  < > 23.3* 22.8* 24.5* 24.6* 23.6*  MCV 74.7*  < > 75.1* 75.0* 76.5* 77.0* 74.8*  PLT 189  < > 212 211 225 224 231  < > = values  in this interval not displayed.  Cardiac Enzymes: No results for input(s): CKTOTAL, CKMB, CKMBINDEX, TROPONINI in the last 168 hours.  BNP: Invalid input(s): POCBNP  CBG:  Recent Labs Lab 04/10/16 1616 04/10/16 2038 04/11/16 0006 04/11/16 0435 04/11/16 0741  GLUCAP 100* 93 103* 77 101*    Microbiology: Results for orders placed or performed during the hospital encounter of 03/25/16  Culture, blood (routine x 2)     Status: None   Collection Time: 03/25/16  7:19 AM  Result Value Ref Range Status   Specimen Description BLOOD LEFT FOREARM  Final   Special Requests BOTTLES DRAWN AEROBIC AND ANAEROBIC  1CC  Final   Culture NO GROWTH 5 DAYS  Final   Report Status 03/30/2016 FINAL  Final  Culture, blood (routine x 2)     Status: None   Collection Time: 03/25/16  7:19 AM  Result Value Ref Range Status   Specimen Description BLOOD LEFT HAND  Final   Special Requests BOTTLES DRAWN AEROBIC AND ANAEROBIC  1CC  Final   Culture NO GROWTH 5 DAYS  Final   Report Status 03/30/2016 FINAL  Final  MRSA PCR Screening     Status: Abnormal   Collection Time: 03/25/16 12:42 PM  Result Value Ref Range Status   MRSA by PCR  POSITIVE (A) NEGATIVE Final    Comment:        The GeneXpert MRSA Assay (FDA approved for NASAL specimens only), is one component of a comprehensive MRSA colonization surveillance program. It is not intended to diagnose MRSA infection nor to guide or monitor treatment for MRSA infections. CRITICAL RESULT CALLED TO, READ BACK BY AND VERIFIED WITH:  AMELIA BERRY AT 1644 03/25/16 SDR   Urine culture     Status: Abnormal   Collection Time: 03/26/16  1:02 AM  Result Value Ref Range Status   Specimen Description URINE, RANDOM  Final   Special Requests NONE  Final   Culture (A)  Final    20,000 COLONIES/mL ESCHERICHIA COLI Results Called to: AMELIA BERRY, RN  AT 1055 ON 03/28/16 BY CTJ. ESBL-EXTENDED SPECTRUM BETA LACTAMASE-THE ORGANISM IS RESISTANT TO PENICILLINS, CEPHALOSPORINS AND AZTREONAM ACCORDING TO CLSI M100-S15 VOL.[place].    Report Status 03/28/2016 FINAL  Final   Organism ID, Bacteria ESCHERICHIA COLI (A)  Final      Susceptibility   Escherichia coli - MIC*    AMPICILLIN >=32 RESISTANT Resistant     CEFAZOLIN >=64 RESISTANT Resistant     CEFTRIAXONE >=64 RESISTANT Resistant     CIPROFLOXACIN >=4 RESISTANT Resistant     GENTAMICIN <=1 SENSITIVE Sensitive     IMIPENEM <=0.25 SENSITIVE Sensitive     NITROFURANTOIN <=16 SENSITIVE Sensitive     TRIMETH/SULFA >=320 RESISTANT Resistant     AMPICILLIN/SULBACTAM 16 INTERMEDIATE Intermediate     PIP/TAZO <=4 SENSITIVE Sensitive     Extended ESBL POSITIVE Resistant     * 20,000 COLONIES/mL ESCHERICHIA COLI  Culture, respiratory (tracheal aspirate)     Status: None   Collection Time: 03/30/16  8:50 AM  Result Value Ref Range Status   Specimen Description TRACHEAL ASPIRATE  Final   Special Requests NONE  Final   Gram Stain FEW WBC SEEN RARE GRAM NEGATIVE RODS   Final   Culture   Final    MODERATE GROWTH METHICILLIN RESISTANT STAPHYLOCOCCUS AUREUS LIGHT GROWTH OCHROBACTRUM ANTHROPI MODERATE GROWTH GRAM NEGATIVE RODS  UNABLE TO FURTHER ID BETA LACTAMASE POSITIVE    Report Status 04/05/2016 FINAL  Final  Organism ID, Bacteria METHICILLIN RESISTANT STAPHYLOCOCCUS AUREUS  Final      Susceptibility   Methicillin resistant staphylococcus aureus - MIC*    CIPROFLOXACIN <=0.5 SENSITIVE Sensitive     ERYTHROMYCIN >=8 RESISTANT Resistant     GENTAMICIN <=0.5 SENSITIVE Sensitive     OXACILLIN >=4 RESISTANT Resistant     TETRACYCLINE 2 SENSITIVE Sensitive     VANCOMYCIN 1 SENSITIVE Sensitive     TRIMETH/SULFA >=320 RESISTANT Resistant     CLINDAMYCIN >=8 RESISTANT Resistant     RIFAMPIN <=0.5 SENSITIVE Sensitive     Inducible Clindamycin NEGATIVE Sensitive     * MODERATE GROWTH METHICILLIN RESISTANT STAPHYLOCOCCUS AUREUS    Coagulation Studies: No results for input(s): LABPROT, INR in the last 72 hours.  Urinalysis: No results for input(s): COLORURINE, LABSPEC, PHURINE, GLUCOSEU, HGBUR, BILIRUBINUR, KETONESUR, PROTEINUR, UROBILINOGEN, NITRITE, LEUKOCYTESUR in the last 72 hours.  Invalid input(s): APPERANCEUR    Imaging: Dg Chest Port 1 View  04/10/2016  CLINICAL DATA:  Patient is on a bipap machine and is being seen for respiratory failure. Hx obesity, former smoker EXAM: PORTABLE CHEST 1 VIEW COMPARISON:  04/09/2016 FINDINGS: Support devices unchanged. Stable mild cardiac silhouette enlargement and mild vascular congestion. Mild diffuse interstitial prominence without definite pulmonary edema. No consolidation. Inferior costophrenic angles excluded from the image. IMPRESSION: No significant change from prior radiograph. Electronically Signed   By: Skipper Cliche M.D.   On: 04/10/2016 07:30     Medications:   . dexmedetomidine 0.35 mcg/kg/hr (04/11/16 0831)   . budesonide (PULMICORT) nebulizer solution  0.25 mg Nebulization 4 times per day  . insulin aspart  0-9 Units Subcutaneous Q4H  . ipratropium-albuterol  3 mL Nebulization Q6H  . levothyroxine  37.5 mcg Intravenous Daily  . pantoprazole  (PROTONIX) IV  40 mg Intravenous Q24H   sodium chloride, albuterol, fentaNYL (SUBLIMAZE) injection, ondansetron (ZOFRAN) IV, sodium chloride flush  Assessment/ Plan:  Ms. Tasha Long is a 60 y.o. 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   1. Acute renal failure on chronic kidney disease stage III with baseline creatinine of 1.2, eGFR of 49:   Secondary to ATN. With elevated vancomycin levels. HD was 4/22, 4/26,4/27, 4/28 - Good urine output noted over the preceding 24 hours however creatinine has trended up. No urgent indication for dialysis at the moment. We will continue to monitor renal parameters closely.   2. Acute respiratory failure: Pt extubated 04/10/16. -  Currently on BiPAP under the instruction of pulmonary critical care.  3. Sepsis/Hypotension secondary to ESBL E. Coli Urinary tract infection and pneumonia (sputum with MRSA):  no longer on vasopressors.    4. Generalized edema - Urine output of 1.7 L over the preceding 24 hours. Patient was net negative yesterday.   5. Morbid obesity - baseline functional status - Bed to chair    LOS: [place], Tranesha Lessner 5/1/20179:41 AM

## 2016-04-11 NOTE — Progress Notes (Signed)
ANTICOAGULATION CONSULT NOTE - Initial Consult  Pharmacy Consult for Heparin Indication: pulmonary embolus  Allergies  Allergen Reactions  . Codeine Hives  . Sulfa Antibiotics Hives   Patient Measurements: Height: 5\' 5"  (165.1 cm) Weight: (!) 325 lb 13.4 oz (147.8 kg) IBW/kg (Calculated) : 57 Heparin Dosing Weight: 94.2 kg  Vital Signs: Temp: 96.9 F (36.1 C) (05/01 0000) Temp Source: Axillary (05/01 0000) BP: 117/78 mmHg (05/01 0200) Pulse Rate: 62 (05/01 0200)  Labs:  Recent Labs  04/09/16 0307 04/10/16 0426 04/10/16 0435 04/11/16 0427  HGB 7.9*  --  7.6* 7.3*  HCT 24.5*  --  24.6* 23.6*  PLT 225  --  224 231  HEPARINUNFRC 0.52 0.49  --  0.57  CREATININE 3.28* 2.76*  --  3.19*    Estimated Creatinine Clearance: 28.3 mL/min (by C-G formula based on Cr of 3.19).   Medical History: History reviewed. No pertinent past medical history.  Medications:  Scheduled:  . budesonide (PULMICORT) nebulizer solution  0.25 mg Nebulization 4 times per day  . insulin aspart  0-9 Units Subcutaneous Q4H  . ipratropium-albuterol  3 mL Nebulization Q6H  . levothyroxine  37.5 mcg Intravenous Daily  . pantoprazole (PROTONIX) IV  40 mg Intravenous Q24H   Infusions:  . dexmedetomidine 0.4 mcg/kg/hr (04/11/16 0406)  . heparin 2,400 Units/hr (04/10/16 2006)    Assessment: 60 y/o F admitted with respiratory arrest ordered heparin for possible PE. Per MD, Cannot rule out PE as patient cannot receive contrast due to AKI.   Goal of Therapy:  Heparin level 0.3-0.7 units/ml Monitor platelets by anticoagulation protocol: Yes   Plan:  Heparin level therapeutic. Continue current rate. Pharmacy will monitor daily.   Laural Benes, Pharm.D., BCPS Clinical Pharmacist 04/11/2016,5:01 AM

## 2016-04-11 NOTE — Progress Notes (Signed)
PT Cancellation Note  Patient Details Name: Tasha Long MRN: [id number] DOB: [birth year]   Cancelled Treatment:    Reason Eval/Treat Not Completed: Patient not medically ready. PT discussed with RN, per RN patient had been off BiPap for most of the morning and was responding to commands. Respiratory is currently with patient, and re-started BiPap. PT will defer mobility evaluation today as patient is apparently quite limited at baseline and is not stable currently for activity which would be rigorous for her. Will re-attempt as patient is able to wean from BiPap.   Kerman Passey, PT, DPT    04/11/2016, 2:05 PM

## 2016-04-11 NOTE — Progress Notes (Signed)
PULMONARY / CRITICAL CARE MEDICINE   Name: Tasha Long MRN: [id number] DOB: 1956/08/25    ADMISSION DATE:  03/25/2016     PT PROFILE:  54 F in chronically very poor health developed abdominal pain and EMS was dispatched. Suffered respiratory arrest and brief cardiac arrest in the transfer from her bed to wheelchair and into ambulance. Intubated and transported to ED. Suffered brief PEA arrest again in ICU shortly after arrival  SUBJECTIVE:  Patient was found on BiPAP,Tolerating well.  Appears to be in no acute distress at this time.  MAJOR EVENTS/TEST RESULTS: 04/14 TTE: 0000000, G1 diastrolic dysfcn, mild LA dil, mod RV dil, mild RA Dil. 4 chamber dilation, with mod sys dysfcn and mild diastolic dysfcn w/diffuse hypokinesis 04/14 LE venous US: poor quality study due to body habitus. No DVT seen 04/14 abdomen US: IMPRESSION: 1. No explanation for abdominal pain. Bowel gas obscures the lower abdominal aorta. Cholecystectomy. ECHO: EF 45%, diffuse hypokinesis, grade 1 diastolic dysfunction [id number] CT chest (wihtout contrast): Severe multilobar bronchopneumonia throughout the lungs bilaterally. Multiple borderline enlarged and minimally enlarged mediastinal and bilateral hilar lymph nodes, presumably reactive. Mild dilatation of the pulmonic trunk (3.5 cm in diameter), suggestive of pulmonary arterial hypertension 04/15 CTAP: No acute pathology 04/16 Renal Consultation 04/17 CT head: Minimal diffuse cortical atrophy. Mild chronic ischemic white matter disease. Mild bilateral maxillary and ethmoid sinusitis. No acute intracranial abnormality seen 04/18 EEG: EEG secondary to general background slowing. This finding may be seen with a diffuse disturbance that is etiologically nonspecific, but may include a metabolic encephalopathy, among other possibilities. No epileptiform activity was noted 04/19 Neurology consultation: Prognosis for neurological recovery deemed guarded 04/21  intermittent HD initiated 04/26 received one unit PRBCs for Hgb 6.9 04/27 ENT consultation for planned trach tube placement 04/30 Dramatic improvement in neurologic function. RASS 0. + F/C. Passed SBT and extubated. Tolerated initially 5/1 on biPAP, wean as tolerated, high risk for intubation  INDWELLING DEVICES:: ETT 04/14 >> 04/30 R [place] CVL 04/14 >>  L IJ HD cath 04/17 >>   MICRO DATA: MRSA PCR 04/14 >> POS Urine 04/14 >> ESBL E coli (sens only to imipenem, pip-tazo, gent) Resp 04/14 >> few gram negative rods  Blood 04/14 >> NEG  Resp 04/19>>  MRSA  PCT algorithm 04/14-04/16: < 0.10, 1.48, 1.59 PCT 04/30: 0.36  ANTIMICROBIALS:  Zosyn 4/14 >> 4/14 Cefepime 4/14 >> 4/17 Metronidazole 4/14 >> 4/17 Meropenem 4/17 >> 04/29 vancomycin 4/14 >> 04/29   VITAL SIGNS: Temp:  [96.9 F (36.1 C)-97.7 F (36.5 C)] 97.6 F (36.4 C) (05/01 0800) Pulse Rate:  [46-94] 46 (05/01 0800) Resp:  [11-30] 16 (05/01 0800) BP: (112-135)/(50-79) 129/56 mmHg (05/01 0800) SpO2:  [90 %-100 %] 100 % (05/01 0800) FiO2 (%):  [30 %-35 %] 30 % (05/01 0800) Weight:  [153.769 kg (339 lb)] 153.769 kg (339 lb) (05/01 0500) HEMODYNAMICS:   VENTILATOR SETTINGS: Vent Mode:  [-] PSV FiO2 (%):  [30 %-35 %] 30 % PEEP:  [5 cmH20] 5 cmH20 Pressure Support:  [5 cmH20] 5 cmH20 INTAKE / OUTPUT:  Intake/Output Summary (Last 24 hours) at 04/11/16 [id number] Last data filed at 04/11/16 0800  Gross per 24 hour  Intake 1380.19 ml  Output   1825 ml  Net -444.81 ml    Review of Systems  Unable to perform ROS: intubated    Physical Exam General Appearance:Patient found on BiPAP Neuro: ,Opens eyes to voice, responds to yes or no questions appropriately HEENT: Atraumatic, normocephalic,PERRLA Pulmonary: Clear  and diminished towards bases Cardiovascular: regular, SB, no MRG noted Abdomen Obese, soft, mildly tender in BUQs, + BS Skin: chronic LE venous stasis changed Extremities: warm to touch,  wrapped   LABS:  CBC  Recent Labs Lab 04/09/16 0307 04/10/16 0435 04/11/16 0427  WBC 8.5 7.5 6.6  HGB 7.9* 7.6* 7.3*  HCT 24.5* 24.6* 23.6*  PLT 225 224 231   Coag's No results for input(s): APTT, INR in the last 168 hours. BMET  Recent Labs Lab 04/09/16 0307 04/10/16 0426 04/11/16 0427  NA 141 142 141  K 4.2 4.0 4.0  CL 102 103 103  CO2 27 28 27   BUN 67* 62* 73*  CREATININE 3.28* 2.76* 3.19*  GLUCOSE 125* 126* 108*   Electrolytes  Recent Labs Lab 04/04/16 1138 04/05/16 0416  04/09/16 0307 04/10/16 0426 04/11/16 0427  CALCIUM 9.3 9.3  9.3  < > 8.8* 8.8* 8.9  PHOS 5.5* 6.5*  --   --   --   --   < > = values in this interval not displayed. Sepsis Markers  Recent Labs Lab 04/10/16 0426  PROCALCITON 0.36   ABG  Recent Labs Lab 04/07/16 0400 04/08/16 0445 04/09/16 0350  PHART 7.39 7.34* 7.48*  PCO2ART 52* 56* 43  PO2ART 105 114* 144*   Liver Enzymes  Recent Labs Lab 04/04/16 1138 04/05/16 0416 04/10/16 0426  AST  --   --  19  ALT  --   --  12*  ALKPHOS  --   --  34*  BILITOT  --   --  0.4  ALBUMIN 2.4* 2.4* 2.1*   Cardiac Enzymes No results for input(s): TROPONINI, PROBNP in the last 168 hours. Glucose  Recent Labs Lab 04/10/16 1204 04/10/16 1616 04/10/16 2038 04/11/16 0006 04/11/16 0435 04/11/16 0741  GLUCAP 104* 100* 93 103* 96 101*    CXR: [place]   ASSESSMENT / PLAN:  PULMONARY A: S/P respiratory arrest COPD, Smoker PTA OSA Concern for PE based on RFs and initial presentation Prolonged VDRF P:  On BiPAP  Supplemental O2 to maintain SpO2 > 90 % BiPAP goal:  PRN BiPAP during day Mandatory BiPAP @ night   CARDIOVASCULAR A:  Cardiac arrest due to respiratory arrest Hypertension, controlled Hyperlipidemia P:  Monitor BP and rhythm MAP goal > 65 mmhg  RENAL A:  AKI, oliguric > nonoliguric Hyperkalemia, resolved P:  Monitor BMET intermittently Monitor I/Os Correct electrolytes as  indicated Cont IHD per renal service  GASTROINTESTINAL A:  Morbid obesity Abdominal pain, nonspecific - no clear etiology Diarrhea Post extubation dysphagia P:  SUP: IV PPI NPO post extubation SLP eval ordered for 05/01  HEMATOLOGIC A:  ICU acquired anemia with microcytosis - no overt bleeding P:  DVT px: full dose heparin Monitor CBC intermittently Transfuse per usual guidelines D/C Heparin infusion.  INFECTIOUS A:  MRSA PNA - fully treated Possible LE cellulitis - treated Prior hx of MRSA cellulitis ESBL in the urine - treated P:  Monitor temp, WBC count Completed course of antibiotics.  ENDOCRINE A:  Mild hyperglycemia without prior history of DM Hypothyroidism P:  Change levothyroxine to IV until able to take PO meds Cont SSI - change to sens scale while NPO  NEUROLOGIC A:  Post arrest anoxic encephalopathy, much improved ICU associated discomfort Intermittent agitation Severe deconditioning P:  RASS goal: 0 Continue dexmedetomidine  Minimize sedating meds  PT eval ordered for 05/01   Bincy Varughese,AG-ACNP Pulmonary & Critical Care   STAFF NOTE: I, Dr. Maretta Bees  Patricia Pesa,  have personally reviewed patient's available data, including medical history, events of note, physical examination and test results as part of my evaluation. I have discussed with NP and other care providers such as pharmacist, RN and RRT.  In addition,  I personally evaluated patient and elicited key findings     A: resp failure-on biPAP, due to severe CHF exacerbation s/p cardiac arrest  P: wean off biPAP as tolerated    The Rest per NP whose note is outlined above and that I agree with  I have personally reviewed/obtained a history, examined the patient, evaluated Pertinent laboratory and RadioGraphic/imaging results, and  formulated the assessment and plan   The Patient requires high complexity decision making for assessment and support, frequent  evaluation and titration of therapies, application of advanced monitoring technologies and extensive interpretation of multiple databases. Critical Care Time devoted to patient care services described in this note is 35  minutes.  This Critical care time does not reflrect procedure time or supervisory time of NP but could involve care discussion time Overall, patient is critically ill, prognosis is guarded.  Patient with Multiorgan failure and at high risk for cardiac arrest and death.   Will need to discuss goals of care with family again.  Corrin Parker, M.D.  [facility]  Medical Director [place] Director [facility] Cardio-Pulmonary Department

## 2016-04-11 NOTE — Progress Notes (Signed)
Nutrition Follow-up  DOCUMENTATION CODES:   Morbid obesity  INTERVENTION:  -Await diet progression post extubation -Recommend nutritional supplement once diet advanced  NUTRITION DIAGNOSIS:   Inadequate oral intake related to acute illness as evidenced by NPO status. Continues as NPO post extubation  GOAL:   Patient will meet greater than or equal to 90% of their needs  MONITOR:   Diet advancement, Labs, I & O's, Weight trends  REASON FOR ASSESSMENT:   Ventilator    ASSESSMENT:    Pt s/p extubation 04/10/16, currently on BiPap; last HD received on 4/28, MD monitoring daily for need for HD; pt with anxiety, on precedex drip  Diet Order:  Diet NPO time specified  Skin:  Reviewed, no issues  Last BM:  04/11/16 stool via rectal tube; abdomen soft/obese  Labs: Creatinine 3.19  Glucose Profile:   Recent Labs  04/11/16 0006 04/11/16 0435 04/11/16 0741  GLUCAP 103* 96 101*   Meds: ss novolog  Height:   Ht Readings from Last 1 Encounters:  04/08/16 5\' 5"  (1.651 m)    Weight:   Wt Readings from Last 1 Encounters:  04/11/16 339 lb (153.769 kg)    Ideal Body Weight:  56.8 kg  BMI:  Body mass index is 56.41 kg/(m^2).  Estimated Nutritional Needs:   Kcal:  [id number] kcals  Protein:  143 g (2.5 g/kg)   Fluid:  >1.7 L  EDUCATION NEEDS:   No education needs identified at this time  [place], [place], [place] [phone] Pager  [phone] Weekend/On-Call Pager

## 2016-04-11 NOTE — Evaluation (Signed)
Clinical/Bedside Swallow Evaluation Patient Details  Name: Tasha Long MRN: [id number] Date of Birth: 03-27-1956  Today's Date: 04/11/2016 Time: SLP Start Time (ACUTE ONLY): 1155 SLP Stop Time (ACUTE ONLY): 1255 SLP Time Calculation (min) (ACUTE ONLY): 60 min  Past Medical History: History reviewed. No pertinent past medical history. Past Surgical History: History reviewed. No pertinent past surgical history. HPI:      Assessment / Plan / Recommendation Clinical Impression  Pt appeared to tolerate few trials of Puree w/ no overt s/s of aspiration noted; inconsistent overt s/s of aspiration noted w/ trials of thin liquids - no decline in ANS/O2 sats but it may have been related to pt's lengthy oral intubation and decreased pharyngeal sensation for swallowing/po's. Pt is at increased risk for aspiration at this time d/t critical respiratory and medical status requiring lengthy oral intubation. No oral phase deficits were noted w/ po trials; pt assisted in feeding self. Due to pt's current presentation including some confusion and declined mental status and risk for aspiration w/ po's, recommend continued po trials w/ SLP during dysphagia tx session tomorrow as ongoing assessment. PT may have few trials of puree (applesauce) w/ NSG supervision and strict aspiration precautions. ST will f/u tomorrow w/ continued po trials in hopes to establish least restrictive po diet. NSG updated and agreed.    Aspiration Risk  Moderate aspiration risk    Diet Recommendation  NPO w/ ongoing po trials as assessment to establish least restrictive diet; aspiration precautions; 100% supervision; feeding assistance  Medication Administration: Crushed with puree    Other  Recommendations Recommended Consults:  (Dietician following) Oral Care Recommendations: Oral care BID;Staff/trained caregiver to provide oral care Other Recommendations:  (TBD)   Follow up Recommendations  Skilled Nursing facility  (TBD)    Frequency and Duration min 3x week  2 weeks       Prognosis Prognosis for Safe Diet Advancement: Fair Barriers to Reach Goals: Cognitive deficits;Severity of deficits      Swallow Study   General Date of Onset: 03/25/16 Type of Study: Bedside Swallow Evaluation Previous Swallow Assessment: none Diet Prior to this Study: NG Tube (ate a regular diet at home prior to hospitalization) Temperature Spikes Noted: No (wbc 6.6) Respiratory Status: Nasal cannula (2-3 liters) History of Recent Intubation: Yes Length of Intubations (days): 15 days Date extubated: 04/10/16 Behavior/Cognition: Alert;Confused;Distractible;Requires cueing (awake) Oral Cavity Assessment: Dry Oral Care Completed by SLP: Yes Oral Cavity - Dentition: Missing dentition (only few teeth) Vision: Functional for self-feeding Self-Feeding Abilities: Total assist Patient Positioning: Postural control adequate for testing Baseline Vocal Quality: Hoarse;Breathy;Low vocal intensity (dysphonia sec. to lengthy oral intubation) Volitional Cough:  (Fair; productive) Volitional Swallow: Able to elicit    Oral/Motor/Sensory Function Overall Oral Motor/Sensory Function: Within functional limits   Ice Chips Ice chips: Within functional limits Presentation: Spoon (5 trials)   Thin Liquid Thin Liquid: Impaired Presentation: Straw (fed/assisted d/t weakness; 5 trials) Oral Phase Impairments:  (none) Oral Phase Functional Implications:  (none) Pharyngeal  Phase Impairments: Cough - Delayed;Throat Clearing - Delayed (x1 each) Other Comments: stated because it was "cold"; no decline in O2 sats or RR    Nectar Thick Nectar Thick Liquid: Not tested   Honey Thick Honey Thick Liquid: Not tested   Puree Puree: Within functional limits Presentation: Spoon (fed; 8 trials) Other Comments: declined anything further   Solid   GO   Solid: Not tested       Orinda Kenner, MS, CCC-SLP  Raymonda Pell 04/11/2016,3:59  PM

## 2016-04-11 NOTE — Care Management (Signed)
Patient has been extubated and now on continuous bipap.  Attending states that there is strong possibility that patient's dialysis will be permament.  This poses a new challenge that patient will have to be able to sit for dialysis.  she will not be eligible for home physical therapy due to her medicaid.  Even if she does go to short term skilled nursing, she would have to be able to sit for dialysis if it is to continue.

## 2016-04-11 NOTE — Progress Notes (Signed)
Speech Therapy Note: received order, reviewed chart notes and consulted NSG re: pt's status this AM. Pt remains on BIPAP at this time w/ hopes to continue her wean today post extubation yesterday. NSG to contact SLP today when pt is able to remain off BIPAP for ~30 minutes w/out distress or need to return to BIPAP.

## 2016-04-12 ENCOUNTER — Encounter
Admission: EM | Disposition: A | Discharge status: Other Acute Inpatient Hospital | Payer: Self-pay | Source: Home / Self Care | Attending: Pulmonary Disease

## 2016-04-12 ENCOUNTER — Inpatient Hospital Stay: Payer: Medicaid Other

## 2016-04-12 LAB — CBC
HCT: 23.4 % — ABNORMAL LOW (ref 35.0–47.0)
Hemoglobin: 7.2 g/dL — ABNORMAL LOW (ref 12.0–16.0)
MCH: 23.3 pg — AB (ref 26.0–34.0)
MCHC: 31 g/dL — AB (ref 32.0–36.0)
MCV: 75.1 fL — ABNORMAL LOW (ref 80.0–100.0)
PLATELETS: 238 10*3/uL (ref 150–440)
RBC: 3.11 MIL/uL — ABNORMAL LOW (ref 3.80–5.20)
RDW: 18.6 % — ABNORMAL HIGH (ref 11.5–14.5)
WBC: 6.5 10*3/uL (ref 3.6–11.0)

## 2016-04-12 LAB — GLUCOSE, CAPILLARY
GLUCOSE-CAPILLARY: 87 mg/dL (ref 65–99)
GLUCOSE-CAPILLARY: 89 mg/dL (ref 65–99)
GLUCOSE-CAPILLARY: 93 mg/dL (ref 65–99)
Glucose-Capillary: 97 mg/dL (ref 65–99)
Glucose-Capillary: 85 mg/dL (ref 65–99)
Glucose-Capillary: 75 mg/dL (ref 65–99)

## 2016-04-12 LAB — RENAL FUNCTION PANEL
ALBUMIN: 2.2 g/dL — AB (ref 3.5–5.0)
ANION GAP: 8 (ref 5–15)
BUN: 77 mg/dL — ABNORMAL HIGH (ref 6–20)
CALCIUM: 9 mg/dL (ref 8.9–10.3)
CO2: 30 mmol/L (ref 22–32)
Chloride: 105 mmol/L (ref 101–111)
Creatinine, Ser: 3.48 mg/dL — ABNORMAL HIGH (ref 0.44–1.00)
GFR, EST AFRICAN AMERICAN: 16 mL/min — AB (ref >60)
GFR, EST NON AFRICAN AMERICAN: 13 mL/min — AB (ref >60)
Glucose, Bld: 89 mg/dL (ref 65–99)
PHOSPHORUS: 5.8 mg/dL — AB (ref 2.5–4.6)
Potassium: 4 mmol/L (ref 3.5–5.1)
SODIUM: 143 mmol/L (ref 135–145)

## 2016-04-12 SURGERY — CREATION, TRACHEOSTOMY
Anesthesia: Choice

## 2016-04-12 MED ORDER — DIAZEPAM 5 MG/ML IJ SOLN
2.5000 mg | Freq: Once | INTRAMUSCULAR | Status: AC
  Administered 2016-04-12: 2.5 mg via INTRAVENOUS
  Filled 2016-04-12: qty 2

## 2016-04-12 NOTE — Progress Notes (Signed)
Notified [facility] of need for PICC line due to limited access.  Several RN's have attempted to access peripheral IV's and unable to find access.  Dr. Mortimer Fries gave order to insert PICC line if unable to obtain Peripheral IV.

## 2016-04-12 NOTE — Progress Notes (Signed)
Patient VSS this shift. Complains of pain requiring Fentanyl 17mcg; given twice this shift.  Patient is very anxious and restless requiring precedex at 0.5 mcg. Patient on 4L O2 with sats stable and greater than 92%.  Patient asked for bi-pap to be put on this morning and removed it herself. O2 sats dropped to 60%, but came back up after being put on 5L O2 by nasal cannula. Speech came by for a swallow eval and put patient on a  Dysphagia I Diet with nurses supervision. PT consult today. Foley and Rectal Tube are in place. Will continue to monitor patient.

## 2016-04-12 NOTE — Progress Notes (Signed)
Pt SOB and refuses neb tx and BiPAP at this time, RT explained to pt the importance of wearing BiPAP and neb tx, but pt still refuses.

## 2016-04-12 NOTE — Progress Notes (Signed)
[facility]  ROUNDING NOTE   Subjective:  Renal function testing still pending this a.m. Creatinine was 3.1 yesterday. Urine output over the preceding 24 hours was 775 cc.   Objective:  Vital signs in last 24 hours:  Temp:  [97.7 F (36.5 C)-98.8 F (37.1 C)] 98.4 F (36.9 C) (05/02 0800) Pulse Rate:  [46-77] 71 (05/02 1000) Resp:  [16-37] 20 (05/02 1000) BP: (106-174)/(52-81) 156/61 mmHg (05/02 1000) SpO2:  [89 %-100 %] 96 % (05/02 1000) FiO2 (%):  [88 %-99 %] 99 % (05/02 0800) Weight:  [180.985 kg (399 lb)] 180.985 kg (399 lb) (05/02 0411)  Weight change: 1.361 kg (3 lb) Filed Weights   04/10/16 0500 04/11/16 0500 04/12/16 0411  Weight: 147.8 kg (325 lb 13.4 oz) 179.624 kg (396 lb) 180.985 kg (399 lb)    Intake/Output: I/O last 3 completed shifts: In: 1771.8 [I.V.:1771.8] Out: 1300 [Urine:1200; Stool:100]   Intake/Output this shift:  Total I/O In: 44.4 [I.V.:44.4] Out: 40 [Urine:40]  Physical Exam: General: Critically ill appearing  Head: [place]/AT hard of hearing OM moist  Eyes: anicteric  Neck: trachea midline  Lungs:  Coarse breath sounds, normal effort  Heart: Regular rate and rhythm  Abdomen:  Soft, nontender, obese, no bowel sounds  Extremities: + peripheral edema.  Neurologic: Awake, alert, will follow commands  Skin: Erythema bilateral lower extremities  Access: Left IJ vascath 4/17 Dr. Mortimer Fries    Basic Metabolic Panel:  Recent Labs Lab 04/07/16 0505 04/08/16 0539 04/09/16 0307 04/10/16 0426 04/11/16 0427  NA 142 140 141 142 141  K 4.1 4.0 4.2 4.0 4.0  CL 104 102 102 103 103  CO2 30 28 27 28 27   GLUCOSE 121* 125* 125* 126* 108*  BUN 91* 80* 67* 62* 73*  CREATININE 4.45* 4.01* 3.28* 2.76* 3.19*  CALCIUM 8.5* 8.6* 8.8* 8.8* 8.9    Liver Function Tests:  Recent Labs Lab 04/10/16 0426  AST 19  ALT 12*  ALKPHOS 34*  BILITOT 0.4  PROT 6.5  ALBUMIN 2.1*   No results for input(s): LIPASE, AMYLASE in the last 168 hours. No  results for input(s): AMMONIA in the last 168 hours.  CBC:  Recent Labs Lab 04/08/16 0539 04/09/16 0307 04/10/16 0435 04/11/16 0427 04/12/16 0446  WBC 8.0 8.5 7.5 6.6 6.5  HGB 7.2* 7.9* 7.6* 7.3* 7.2*  HCT 22.8* 24.5* 24.6* 23.6* 23.4*  MCV 75.0* 76.5* 77.0* 74.8* 75.1*  PLT 211 225 224 231 238    Cardiac Enzymes: No results for input(s): CKTOTAL, CKMB, CKMBINDEX, TROPONINI in the last 168 hours.  BNP: Invalid input(s): POCBNP  CBG:  Recent Labs Lab 04/11/16 1622 04/11/16 1929 04/11/16 2348 04/12/16 0335 04/12/16 0714  GLUCAP 91 93 97 85 75    Microbiology: Results for orders placed or performed during the hospital encounter of 03/25/16  Culture, blood (routine x 2)     Status: None   Collection Time: 03/25/16  7:19 AM  Result Value Ref Range Status   Specimen Description BLOOD LEFT FOREARM  Final   Special Requests BOTTLES DRAWN AEROBIC AND ANAEROBIC  1CC  Final   Culture NO GROWTH 5 DAYS  Final   Report Status 03/30/2016 FINAL  Final  Culture, blood (routine x 2)     Status: None   Collection Time: 03/25/16  7:19 AM  Result Value Ref Range Status   Specimen Description BLOOD LEFT HAND  Final   Special Requests BOTTLES DRAWN AEROBIC AND ANAEROBIC  1CC  Final   Culture  NO GROWTH 5 DAYS  Final   Report Status 03/30/2016 FINAL  Final  MRSA PCR Screening     Status: Abnormal   Collection Time: 03/25/16 12:42 PM  Result Value Ref Range Status   MRSA by PCR POSITIVE (A) NEGATIVE Final    Comment:        The GeneXpert MRSA Assay (FDA approved for NASAL specimens only), is one component of a comprehensive MRSA colonization surveillance program. It is not intended to diagnose MRSA infection nor to guide or monitor treatment for MRSA infections. CRITICAL RESULT CALLED TO, READ BACK BY AND VERIFIED WITH:  AMELIA BERRY AT 1644 03/25/16 SDR   Urine culture     Status: Abnormal   Collection Time: 03/26/16  1:02 AM  Result Value Ref Range Status   Specimen  Description URINE, RANDOM  Final   Special Requests NONE  Final   Culture (A)  Final    20,000 COLONIES/mL ESCHERICHIA COLI Results Called to: AMELIA BERRY, RN  AT 1055 ON 03/28/16 BY CTJ. ESBL-EXTENDED SPECTRUM BETA LACTAMASE-THE ORGANISM IS RESISTANT TO PENICILLINS, CEPHALOSPORINS AND AZTREONAM ACCORDING TO CLSI M100-S15 VOL.[place].    Report Status 03/28/2016 FINAL  Final   Organism ID, Bacteria ESCHERICHIA COLI (A)  Final      Susceptibility   Escherichia coli - MIC*    AMPICILLIN >=32 RESISTANT Resistant     CEFAZOLIN >=64 RESISTANT Resistant     CEFTRIAXONE >=64 RESISTANT Resistant     CIPROFLOXACIN >=4 RESISTANT Resistant     GENTAMICIN <=1 SENSITIVE Sensitive     IMIPENEM <=0.25 SENSITIVE Sensitive     NITROFURANTOIN <=16 SENSITIVE Sensitive     TRIMETH/SULFA >=320 RESISTANT Resistant     AMPICILLIN/SULBACTAM 16 INTERMEDIATE Intermediate     PIP/TAZO <=4 SENSITIVE Sensitive     Extended ESBL POSITIVE Resistant     * 20,000 COLONIES/mL ESCHERICHIA COLI  Culture, respiratory (tracheal aspirate)     Status: None   Collection Time: 03/30/16  8:50 AM  Result Value Ref Range Status   Specimen Description TRACHEAL ASPIRATE  Final   Special Requests NONE  Final   Gram Stain FEW WBC SEEN RARE GRAM NEGATIVE RODS   Final   Culture   Final    MODERATE GROWTH METHICILLIN RESISTANT STAPHYLOCOCCUS AUREUS LIGHT GROWTH OCHROBACTRUM ANTHROPI MODERATE GROWTH GRAM NEGATIVE RODS UNABLE TO FURTHER ID BETA LACTAMASE POSITIVE    Report Status 04/05/2016 FINAL  Final   Organism ID, Bacteria METHICILLIN RESISTANT STAPHYLOCOCCUS AUREUS  Final      Susceptibility   Methicillin resistant staphylococcus aureus - MIC*    CIPROFLOXACIN <=0.5 SENSITIVE Sensitive     ERYTHROMYCIN >=8 RESISTANT Resistant     GENTAMICIN <=0.5 SENSITIVE Sensitive     OXACILLIN >=4 RESISTANT Resistant     TETRACYCLINE 2 SENSITIVE Sensitive     VANCOMYCIN 1 SENSITIVE Sensitive     TRIMETH/SULFA >=320  RESISTANT Resistant     CLINDAMYCIN >=8 RESISTANT Resistant     RIFAMPIN <=0.5 SENSITIVE Sensitive     Inducible Clindamycin NEGATIVE Sensitive     * MODERATE GROWTH METHICILLIN RESISTANT STAPHYLOCOCCUS AUREUS    Coagulation Studies: No results for input(s): LABPROT, INR in the last 72 hours.  Urinalysis: No results for input(s): COLORURINE, LABSPEC, PHURINE, GLUCOSEU, HGBUR, BILIRUBINUR, KETONESUR, PROTEINUR, UROBILINOGEN, NITRITE, LEUKOCYTESUR in the last 72 hours.  Invalid input(s): APPERANCEUR    Imaging: No results found.   Medications:   . dexmedetomidine 0.4 mcg/kg/hr (04/12/16 0625)   . budesonide (PULMICORT) nebulizer  solution  0.25 mg Nebulization 4 times per day  . heparin subcutaneous  5,000 Units Subcutaneous Q8H  . insulin aspart  0-9 Units Subcutaneous Q4H  . ipratropium-albuterol  3 mL Nebulization Q6H  . levothyroxine  37.5 mcg Intravenous Daily  . pantoprazole (PROTONIX) IV  40 mg Intravenous Q24H   sodium chloride, albuterol, fentaNYL (SUBLIMAZE) injection, ondansetron (ZOFRAN) IV, sodium chloride flush  Assessment/ Plan:  Ms. Otisha Spickler is a 60 y.o. 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   1. Acute renal failure on chronic kidney disease stage III with baseline creatinine of 1.2, eGFR of 49:   Secondary to ATN. With elevated vancomycin levels. HD was 4/22, 4/26,4/27, 4/28.  CT scan abd/pelvis negative for hydronephrosis. - Urine output was 775 cc over the receding 24 hours. Serum creatinine is still pending this a.m. If creatinine continues to climb we may need to reconsider dialysis but we will hold off for now.  2. Acute respiratory failure: Pt  extubated 04/10/16. -  Patient was not on BiPAP this a.m. She appeared to be breathing comfortably. Continue to monitor respiratory status closely.  3. Sepsis/Hypotension secondary to ESBL E. Coli Urinary tract infection and pneumonia (sputum with MRSA):  no longer on vasopressors.    4. Generalized edema - Still has some generalized edema. Holding off on diuretic therapy given renal insufficiency. Continue to monitor.   5. Morbid obesity - baseline functional status - Bed to chair    LOS: [place], Royetta Probus 5/2/201711:28 AM

## 2016-04-12 NOTE — Progress Notes (Signed)
PULMONARY / CRITICAL CARE MEDICINE   Name: Tasha Long MRN: [id number] DOB: December 15, 1955    ADMISSION DATE:  03/25/2016     PT PROFILE:  56 F in chronically very poor health developed abdominal pain and EMS was dispatched. Suffered respiratory arrest and brief cardiac arrest in the transfer from her bed to wheelchair and into ambulance. Intubated and transported to ED. Suffered brief PEA arrest again in ICU shortly after arrival  SUBJECTIVE:  Patient was found on BiPAP,Tolerating well.  Appears to be in some distress at this time.  MAJOR EVENTS/TEST RESULTS: 04/14 TTE: 0000000, G1 diastrolic dysfcn, mild LA dil, mod RV dil, mild RA Dil. 4 chamber dilation, with mod sys dysfcn and mild diastolic dysfcn w/diffuse hypokinesis 04/14 LE venous US: poor quality study due to body habitus. No DVT seen 04/14 abdomen US: IMPRESSION: 1. No explanation for abdominal pain. Bowel gas obscures the lower abdominal aorta. Cholecystectomy. ECHO: EF 45%, diffuse hypokinesis, grade 1 diastolic dysfunction [id number] CT chest (wihtout contrast): Severe multilobar bronchopneumonia throughout the lungs bilaterally. Multiple borderline enlarged and minimally enlarged mediastinal and bilateral hilar lymph nodes, presumably reactive. Mild dilatation of the pulmonic trunk (3.5 cm in diameter), suggestive of pulmonary arterial hypertension 04/15 CTAP: No acute pathology 04/16 Renal Consultation 04/17 CT head: Minimal diffuse cortical atrophy. Mild chronic ischemic white matter disease. Mild bilateral maxillary and ethmoid sinusitis. No acute intracranial abnormality seen 04/18 EEG: EEG secondary to general background slowing. This finding may be seen with a diffuse disturbance that is etiologically nonspecific, but may include a metabolic encephalopathy, among other possibilities. No epileptiform activity was noted 04/19 Neurology consultation: Prognosis for neurological recovery deemed guarded 04/21  intermittent HD initiated 04/26 received one unit PRBCs for Hgb 6.9 04/27 ENT consultation for planned trach tube placement 04/30 Dramatic improvement in neurologic function. RASS 0. + F/C. Passed SBT and extubated. Tolerated initially 5/1 on biPAP, wean as tolerated, high risk for intubation  INDWELLING DEVICES:: ETT 04/14 >> 04/30 R [place] CVL 04/14 >>  L IJ HD cath 04/17 >>   MICRO DATA: MRSA PCR 04/14 >> POS Urine 04/14 >> ESBL E coli (sens only to imipenem, pip-tazo, gent) Resp 04/14 >> few gram negative rods  Blood 04/14 >> NEG  Resp 04/19>>  MRSA  PCT algorithm 04/14-04/16: < 0.10, 1.48, 1.59 PCT 04/30: 0.36  ANTIMICROBIALS:  Zosyn 4/14 >> 4/14 Cefepime 4/14 >> 4/17 Metronidazole 4/14 >> 4/17 Meropenem 4/17 >> 04/29 vancomycin 4/14 >> 04/29   VITAL SIGNS: Temp:  [97.7 F (36.5 C)-98.8 F (37.1 C)] 98.3 F (36.8 C) (05/02 0400) Pulse Rate:  [46-77] 74 (05/02 0800) Resp:  [16-31] 27 (05/02 0800) BP: (106-174)/(52-90) 174/64 mmHg (05/02 0800) SpO2:  [89 %-100 %] 96 % (05/02 0800) FiO2 (%):  [88 %-99 %] 99 % (05/02 0800) Weight:  [399 lb (180.985 kg)] 399 lb (180.985 kg) (05/02 0411) HEMODYNAMICS:   VENTILATOR SETTINGS: Vent Mode:  [-]  FiO2 (%):  [88 %-99 %] 99 % INTAKE / OUTPUT:  Intake/Output Summary (Last 24 hours) at 04/12/16 0911 Last data filed at 04/12/16 0800  Gross per 24 hour  Intake    369 ml  Output    815 ml  Net   -446 ml    Review of Systems  Constitutional: Negative for fever, chills, weight loss and malaise/fatigue.  Eyes: Negative for blurred vision.  Respiratory: Positive for shortness of breath. Negative for cough, hemoptysis and wheezing.   Cardiovascular: Negative for chest pain.  Gastrointestinal: Negative for heartburn,  nausea and vomiting.  Neurological: Negative for dizziness and headaches.  Psychiatric/Behavioral: The patient is nervous/anxious.   All other systems reviewed and are negative.   Physical Exam   Constitutional: No distress.  Eyes: Pupils are equal, round, and reactive to light.  Neck: Normal range of motion. Neck supple.  Cardiovascular: Normal rate and regular rhythm.   No murmur heard. Pulmonary/Chest: No respiratory distress. She has no wheezes. She has rales.  Abdominal: Soft. Bowel sounds are normal.  Musculoskeletal: She exhibits edema.  Skin: Skin is warm. She is diaphoretic.    LABS:  CBC  Recent Labs Lab 04/10/16 0435 04/11/16 0427 04/12/16 0446  WBC 7.5 6.6 6.5  HGB 7.6* 7.3* 7.2*  HCT 24.6* 23.6* 23.4*  PLT 224 231 238   Coag's No results for input(s): APTT, INR in the last 168 hours. BMET  Recent Labs Lab 04/09/16 0307 04/10/16 0426 04/11/16 0427  NA 141 142 141  K 4.2 4.0 4.0  CL 102 103 103  CO2 27 28 27   BUN 67* 62* 73*  CREATININE 3.28* 2.76* 3.19*  GLUCOSE 125* 126* 108*   Electrolytes  Recent Labs Lab 04/09/16 0307 04/10/16 0426 04/11/16 0427  CALCIUM 8.8* 8.8* 8.9   Sepsis Markers  Recent Labs Lab 04/10/16 0426  PROCALCITON 0.36   ABG  Recent Labs Lab 04/07/16 0400 04/08/16 0445 04/09/16 0350  PHART 7.39 7.34* 7.48*  PCO2ART 52* 56* 43  PO2ART 105 114* 144*   Liver Enzymes  Recent Labs Lab 04/10/16 0426  AST 19  ALT 12*  ALKPHOS 34*  BILITOT 0.4  ALBUMIN 2.1*   Cardiac Enzymes No results for input(s): TROPONINI, PROBNP in the last 168 hours. Glucose  Recent Labs Lab 04/11/16 1215 04/11/16 1622 04/11/16 1929 04/11/16 2348 04/12/16 0335 04/12/16 0714  GLUCAP 87 91 93 97 85 75    CXR: [place]   ASSESSMENT / PLAN:59 morbidly obese white female with multiorgan dysfunction, with CHF, renal failure and OSA/OHS She will need noninvasive ventilation with BiPAP in order to live and survive and she will die without it Her quality of life is poor  Patient has chronic resp acidosis with pco2 of 52 with near normal pH and she will need oxygen therapy continious  PULMONARY A: S/P respiratory  arrest COPD, Smoker PTA OSA Concern for PE based on RFs and initial presentation Prolonged VDRF P:  On BiPAP  Supplemental O2 to maintain SpO2 > 90 % BiPAP goal:  PRN BiPAP during day Mandatory BiPAP @ night   CARDIOVASCULAR A:  Cardiac arrest due to respiratory arrest Hypertension, controlled Hyperlipidemia P:  Monitor BP and rhythm MAP goal > 65 mmhg -cardiology consult to optimize chf meds s/p NSTEMI  RENAL A:  AKI, oliguric > nonoliguric Hyperkalemia, resolved P:  Monitor BMET intermittently Monitor I/Os Correct electrolytes as indicated Cont IHD per renal service  GASTROINTESTINAL A:  Morbid obesity Abdominal pain, nonspecific - no clear etiology Diarrhea Post extubation dysphagia P:  SUP: IV PPI NPO post extubation SLP eval ordered for 05/01  HEMATOLOGIC A:  ICU acquired anemia with microcytosis - no overt bleeding P:  DVT px: full dose heparin Monitor CBC intermittently Transfuse per usual guidelines D/C Heparin infusion.  INFECTIOUS A:  MRSA PNA - fully treated Possible LE cellulitis - treated Prior hx of MRSA cellulitis ESBL in the urine - treated P:  Monitor temp, WBC count Completed course of antibiotics.  ENDOCRINE A:  Mild hyperglycemia without prior history of DM Hypothyroidism P:  Change levothyroxine  to IV until able to take PO meds Cont SSI - change to sens scale while NPO  NEUROLOGIC A:  Post arrest anoxic encephalopathy, much improved ICU associated discomfort Intermittent agitation Severe deconditioning P:  RASS goal: 0 Continue dexmedetomidine  Minimize sedating meds  PT eval ordered for 05/01  The Patient requires high complexity decision making for assessment and support, frequent evaluation and titration of therapies. Patient/Family are satisfied with Plan of action and management. All questions answered  Corrin Parker, M.D.  [facility]   Medical Director [place] Director [facility] Cardio-Pulmonary Department

## 2016-04-12 NOTE — Care Management (Signed)
Patient is now on nasal cannula. Has been assessed by SLP .  Recommending nectar thickened liquids.  There is discussion regarding patient compliance with restrictions that come with nectar thick liquids.  Discussed again the concerns of need to anticipate need for continued dialysis and the need for patient to be able to sit.  Attending spoke with daughter Marita [place] regarding guarded prognosis for patient.  She remains a full code.  Need to address code status and readdress discharge dispositions. At present which defer palliative care consult due to the dynamics of family members.  Dr Mortimer Fries will be meeting with patient's two daughters 5/3 morning.  CM will make all attempts to attend.  spoke with Corene Cornea from [facility] about home bipap criteria.  Will speak with attending about overnight oximetry on nasal cannula.  Current documentation from physician makes reference to  noninvasive ventilation (which is interpretted as a noninvasive home ventilator- which is not the intent and medicaid does not cover)  The intent if patient is able to discharge home is bipap.  Must be at least 2 hours on 2 liters or patient prescribed oxygen setting whichever is higher.  Corene Cornea will check to confirm that hypoventilation syndrome is an acceptable diagnosis for at least 2 hours.

## 2016-04-12 NOTE — Progress Notes (Signed)
Speech Language Pathology Treatment: Dysphagia  Patient Details Name: Tasha Long MRN: [id number] DOB: [birth year] Today's Date: 04/12/2016 Time: 1100-1200 SLP Time Calculation (min) (ACUTE ONLY): 60 min  Assessment / Plan / Recommendation Clinical Impression  Pt appeared to present w/ adequate toleration of few po trials of purees, single ice chips, and Nectar consistency liquids in SMALL amounts delivered via cup put to lips. Pt exhibited overt s/s of aspiration c/b immediate and delayed coughing w/ trials of thin liquids; a delayed throat clearing was noted x1/5 trials of Nectar liquids. Pt required verbal cues and monitoring during the po trials in order to give rest breaks to lessen WOB/SOB as pt easily became anxious w/ increased respiratory effort w/ the exertion of po trials. Pt required verbal encouragement and redirection to task of po trials; confusion noted in some of her responses.  MD and care team consulted about pt's presentation during po trials and her increased risk for aspiration w/ po's secondary to her declined respiratory and medical status' at this time. MD acknowledged. Per MD and care team agreement, an oral diet of Dys. 1 w/ Nectar liquids will be initiated w/ strict aspiration precautions and feeding assistance at meals to monitor pt w/ oral intake and support w/ feeding. NSG and RT will monitor respiratory status for any decline in respiratory status. ST services will f/u w/ toleration of diet; trials to upgrade diet as appropriate. MD agreed.    HPI        SLP Plan  Continue with current plan of care     Recommendations  Diet recommendations: Dysphagia 1 (puree);Nectar-thick liquid Liquids provided via: Cup Medication Administration: Crushed with puree Supervision: Full supervision/cueing for compensatory strategies;Staff to assist with self feeding Compensations: Minimize environmental distractions;Slow rate;Small sips/bites;Follow solids with liquid  (frequent rest breaks) Postural Changes and/or Swallow Maneuvers: Seated upright 90 degrees             General recommendations:  (Dietician) Oral Care Recommendations: Oral care BID;Staff/trained caregiver to provide oral care Follow up Recommendations: Skilled Nursing facility Plan: Continue with current plan of care     [place], [place], CCC-SLP  Tasha Long 04/12/2016, 4:34 PM

## 2016-04-12 NOTE — Progress Notes (Signed)
[facility] Progress Note Patient Name: Tasha Long DOB: 1955/12/28 MRN: [id number]   Date of Service  04/12/2016  HPI/Events of Note  Patient sleeping comfortably on BiPAP 12/5 with FiO2 0.3. Vital stable. No distress.  eICU Interventions  Continue BiPAP & ICU care.     Intervention Category Major Interventions: Respiratory failure - evaluation and management  Tera Partridge 04/12/2016, 1:28 AM

## 2016-04-12 NOTE — Evaluation (Signed)
Physical Therapy Evaluation Patient Details Name: Tasha Long MRN: [id number] DOB: 09-26-1956 Today's Date: 04/12/2016   History of Present Illness  Pt is a 60 y.o. F admitted to hospital for chronic diarrhea and IBS with abdominal pain. Pt intubated upon arrival to ED on 4/14, later extubated on 4/30. Pt experienced code Blue cardiac arrest and respiratory arrest while in hospital. Pt received hemodialysis cath on L IJ. Pt uses BiPap at night and PRN during the day. Pt has hx of hypothyroidism, morbid obesity, and asthma.   Clinical Impression  Pt is a 60 y.o. F admitted to hospital for chronic diarrhea and IBS. Pt went into cardiac arrest and respiratory arrest while in hospital. Prior to admission, pt lived at home with daughter and family. Pt able to ambulate with RW. Pt required assist with all ADLs from daughter. Pt awake during evaluation, however appears confused. Pt demonstrated poor UE and fair LE strength. Pt able to perform partial bed mobility with use of bed railings and 2+ max assist. Mobility limited by pts anxiety and fatigue. Pt performed supine there-ex on B LE with min to no assist. There-ex limited by pts fatigue. Pt on 4L O2 t/o all mobility. Pts vitals remained stable t/o evaluation. Pt demonstrates deficits in strength, mobility, and balance. Spoke with daughter about help/equipment available at home. Daughter adimant about pt going home after discharge from acute hospitalization. Recommend pt receive home health PT after discharge from acute hospitalization.     Follow Up Recommendations Home health PT    Equipment Recommendations  Wheelchair (measurements PT);3in1 (PT);Wheelchair cushion (measurements PT) (bariatric wheelchair and [facility])    Recommendations for Other Services       Precautions / Restrictions Precautions Precautions: Fall Restrictions Weight Bearing Restrictions: No      Mobility  Bed Mobility Overal bed mobility: +2 for physical  assistance;Needs Assistance Bed Mobility: Supine to Sit     Supine to sit: Max assist;+2 for physical assistance     General bed mobility comments: Pt able to perform partial bed mobility with use of bed railings and 2+ max assist. Pt able to assist with moving B LE and raising hips with PT assist. Pt required assist to sit trunk up. Pt became anxious during mobility and requested to lie back down.   Transfers                 General transfer comment: Unable to perform transfer d/t pts mobility status.   Ambulation/Gait                Stairs            Wheelchair Mobility    Modified Rankin (Stroke Patients Only)       Balance Overall balance assessment: Needs assistance   Sitting balance-Leahy Scale: Poor Sitting balance - Comments: Unable to fully assess sitting balance as pt was not able to sit on EOB. Pt required max assist to move trunk forward in bed and was unable to maintain w/o 2+ assist.        Standing balance comment: Unable to assess pts standing balance d/t pts safety and temporary L IJ hemodialysis cath.                              Pertinent Vitals/Pain Pain Assessment: Faces Faces Pain Scale: Hurts even more    Home Living Family/patient expects to be discharged to:: Private residence Living Arrangements: Children Available  Help at Discharge: Family Type of Home: House Home Access: Ramped entrance     Home Layout: One level Home Equipment: Environmental consultant - 2 wheels;Hospital bed Additional Comments: Pt lives with daughter. Per daughter, she helps pt with all ADLs.     Prior Function Level of Independence: Needs assistance   Gait / Transfers Assistance Needed: Pt ambulates with RW.   ADL's / Homemaking Assistance Needed: Pt requires assist from family for all ADLs.         Hand Dominance        Extremity/Trunk Assessment   Upper Extremity Assessment: RUE deficits/detail;LUE deficits/detail RUE Deficits / Details:  R UE grossly 3+/5 strength     LUE Deficits / Details: L UE grossly 3+/5 strength   Lower Extremity Assessment: RLE deficits/detail;LLE deficits/detail RLE Deficits / Details: R LE grossly 4-/5 strength, limited knee ROM LLE Deficits / Details: L LE grossly 4-/5 strength, limited knee ROM     Communication   Communication: No difficulties;Other (comment) (soft-spoken )  Cognition Arousal/Alertness: Awake/alert Behavior During Therapy: Anxious Overall Cognitive Status: Impaired/Different from baseline Area of Impairment: Following commands       Following Commands: Follows one step commands with increased time            General Comments      Exercises Other Exercises Other Exercises: Pt able to perform supine ther-ex on B LE including ankle pumps with no assist, and quad sets with min assist. Pt became fatigued with ther-ex. All ther-ex performed x10 reps.       Assessment/Plan    PT Assessment Patient needs continued PT services  PT Diagnosis Difficulty walking;Abnormality of gait;Generalized weakness   PT Problem List Decreased strength;Decreased activity tolerance;Decreased balance;Decreased mobility  PT Treatment Interventions DME instruction;Gait training;Therapeutic activities;Therapeutic exercise;Balance training   PT Goals (Current goals can be found in the Care Plan section) Acute Rehab PT Goals Patient Stated Goal: to return to PLOF PT Goal Formulation: With patient/family Time For Goal Achievement: 04/26/16 Potential to Achieve Goals: Poor    Frequency Min 2X/week   Barriers to discharge        Co-evaluation               End of Session Equipment Utilized During Treatment: Oxygen Activity Tolerance: Patient limited by fatigue Patient left: in bed;with call bell/phone within reach;with family/visitor present Nurse Communication: Mobility status         Time: [id number] PT Time Calculation (min) (ACUTE ONLY): 30 min   Charges:          PT G Codes:        Sherral Hammers 04-24-16, 5:24 PM M. Barnett Abu, SPT

## 2016-04-13 LAB — PROTEIN ELECTRO, RANDOM URINE
ALPHA-2-GLOBULIN, U: 18.8 %
Albumin ELP, Urine: 25.7 %
Alpha-1-Globulin, U: 5.5 %
BETA GLOBULIN, U: 15.5 %
Gamma Globulin, U: 34.5 %
M SPIKE UR: 6.2 % — AB
Total Protein, Urine: 179 mg/dL

## 2016-04-13 LAB — PROTEIN ELECTROPHORESIS, SERUM
A/G Ratio: 0.6 — ABNORMAL LOW (ref 0.7–1.7)
ALBUMIN ELP: 2.4 g/dL — AB (ref 2.9–4.4)
ALPHA-1-GLOBULIN: 0.3 g/dL (ref 0.0–0.4)
ALPHA-2-GLOBULIN: 0.8 g/dL (ref 0.4–1.0)
BETA GLOBULIN: 1.3 g/dL (ref 0.7–1.3)
GAMMA GLOBULIN: 1.3 g/dL (ref 0.4–1.8)
Globulin, Total: 3.7 g/dL (ref 2.2–3.9)
Total Protein ELP: 6.1 g/dL (ref 6.0–8.5)

## 2016-04-13 LAB — GLUCOSE, CAPILLARY
GLUCOSE-CAPILLARY: 80 mg/dL (ref 65–99)
GLUCOSE-CAPILLARY: 84 mg/dL (ref 65–99)
GLUCOSE-CAPILLARY: 74 mg/dL (ref 65–99)
Glucose-Capillary: 82 mg/dL (ref 65–99)
Glucose-Capillary: 83 mg/dL (ref 65–99)
Glucose-Capillary: 87 mg/dL (ref 65–99)
Glucose-Capillary: 77 mg/dL (ref 65–99)

## 2016-04-13 LAB — RENAL FUNCTION PANEL
ALBUMIN: 2.2 g/dL — AB (ref 3.5–5.0)
ANION GAP: 14 (ref 5–15)
BUN: 79 mg/dL — ABNORMAL HIGH (ref 6–20)
CALCIUM: 8.6 mg/dL — AB (ref 8.9–10.3)
CO2: 24 mmol/L (ref 22–32)
Chloride: 107 mmol/L (ref 101–111)
Creatinine, Ser: 3.77 mg/dL — ABNORMAL HIGH (ref 0.44–1.00)
GFR calc non Af Amer: 12 mL/min — ABNORMAL LOW (ref >60)
GFR, EST AFRICAN AMERICAN: 14 mL/min — AB (ref >60)
GLUCOSE: 89 mg/dL (ref 65–99)
PHOSPHORUS: 5.9 mg/dL — AB (ref 2.5–4.6)
Potassium: 4.1 mmol/L (ref 3.5–5.1)
SODIUM: 145 mmol/L (ref 135–145)

## 2016-04-13 NOTE — Progress Notes (Addendum)
End treatment note

## 2016-04-13 NOTE — Progress Notes (Signed)
Pre-hd tx 

## 2016-04-13 NOTE — Progress Notes (Signed)
PULMONARY / CRITICAL CARE MEDICINE   Name: Tasha Long MRN: [id number] DOB: 1956/03/12    ADMISSION DATE:  03/25/2016     PT PROFILE:  41 F in chronically very poor health developed abdominal pain and EMS was dispatched. Suffered respiratory arrest and brief cardiac arrest in the transfer from her bed to wheelchair and into ambulance. Intubated and transported to ED. Suffered brief PEA arrest again in ICU shortly after arrival  SUBJECTIVE:  Patient placed on BiPAP for worsening resp distress,in resp  distress at this time.   MAJOR EVENTS/TEST RESULTS: 04/14 TTE: 0000000, G1 diastrolic dysfcn, mild LA dil, mod RV dil, mild RA Dil. 4 chamber dilation, with mod sys dysfcn and mild diastolic dysfcn w/diffuse hypokinesis 04/14 LE venous US: poor quality study due to body habitus. No DVT seen 04/14 abdomen US: IMPRESSION: 1. No explanation for abdominal pain. Bowel gas obscures the lower abdominal aorta. Cholecystectomy. ECHO: EF 45%, diffuse hypokinesis, grade 1 diastolic dysfunction [id number] CT chest (wihtout contrast): Severe multilobar bronchopneumonia throughout the lungs bilaterally. Multiple borderline enlarged and minimally enlarged mediastinal and bilateral hilar lymph nodes, presumably reactive. Mild dilatation of the pulmonic trunk (3.5 cm in diameter), suggestive of pulmonary arterial hypertension 04/15 CTAP: No acute pathology 04/16 Renal Consultation 04/17 CT head: Minimal diffuse cortical atrophy. Mild chronic ischemic white matter disease. Mild bilateral maxillary and ethmoid sinusitis. No acute intracranial abnormality seen 04/18 EEG: EEG secondary to general background slowing. This finding may be seen with a diffuse disturbance that is etiologically nonspecific, but may include a metabolic encephalopathy, among other possibilities. No epileptiform activity was noted 04/19 Neurology consultation: Prognosis for neurological recovery deemed guarded 04/21 intermittent  HD initiated 04/26 received one unit PRBCs for Hgb 6.9 04/27 ENT consultation for planned trach tube placement 04/30 Dramatic improvement in neurologic function. RASS 0. + F/C. Passed SBT and extubated. Tolerated initially 5/1 on biPAP, wean as tolerated, high risk for intubation 5/3 back on biPAP  INDWELLING DEVICES:: ETT 04/14 >> 04/30 R [place] CVL 04/14 >>  L IJ HD cath 04/17 >>5/2 PiCC line 5/2   MICRO DATA: MRSA PCR 04/14 >> POS Urine 04/14 >> ESBL E coli (sens only to imipenem, pip-tazo, gent) Resp 04/14 >> few gram negative rods  Blood 04/14 >> NEG  Resp 04/19>>  MRSA  PCT algorithm 04/14-04/16: < 0.10, 1.48, 1.59 PCT 04/30: 0.36  ANTIMICROBIALS:  Zosyn 4/14 >> 4/14 Cefepime 4/14 >> 4/17 Metronidazole 4/14 >> 4/17 Meropenem 4/17 >> 04/29 vancomycin 4/14 >> 04/29   VITAL SIGNS: Temp:  [97.9 F (36.6 C)-99.2 F (37.3 C)] 98.8 F (37.1 C) (05/03 1400) Pulse Rate:  [51-84] 84 (05/03 1500) Resp:  [19-50] 44 (05/03 1500) BP: (121-164)/(48-86) 164/64 mmHg (05/03 1500) SpO2:  [93 %-100 %] 95 % (05/03 1500) FiO2 (%):  [30 %-35 %] 35 % (05/03 1401) Weight:  [398 lb 13 oz (180.9 kg)] 398 lb 13 oz (180.9 kg) (05/03 0356) HEMODYNAMICS:   VENTILATOR SETTINGS: Vent Mode:  [-]  FiO2 (%):  [30 %-35 %] 35 % INTAKE / OUTPUT:  Intake/Output Summary (Last 24 hours) at 04/13/16 1521 Last data filed at 04/13/16 1000  Gross per 24 hour  Intake 345.45 ml  Output    650 ml  Net -304.55 ml    Review of Systems  Unable to perform ROS: acuity of condition  Constitutional: Positive for malaise/fatigue.  Respiratory: Positive for shortness of breath and wheezing.   Psychiatric/Behavioral: The patient is nervous/anxious.   All other systems reviewed and  are negative.   Physical Exam  Constitutional: She appears distressed.  Eyes: Pupils are equal, round, and reactive to light.  Neck: Normal range of motion. Neck supple.  Cardiovascular: Normal rate and regular rhythm.    No murmur heard. Pulmonary/Chest: She is in respiratory distress. She has no wheezes. She has rales.  Abdominal: Soft. Bowel sounds are normal.  Musculoskeletal: She exhibits edema.  Neurological: She is alert. No cranial nerve deficit.  Skin: Skin is warm. She is diaphoretic.    LABS:  CBC  Recent Labs Lab 04/10/16 0435 04/11/16 0427 04/12/16 0446  WBC 7.5 6.6 6.5  HGB 7.6* 7.3* 7.2*  HCT 24.6* 23.6* 23.4*  PLT 224 231 238   Coag's No results for input(s): APTT, INR in the last 168 hours. BMET  Recent Labs Lab 04/11/16 0427 04/12/16 0446 04/13/16 0918  NA 141 143 145  K 4.0 4.0 4.1  CL 103 105 107  CO2 27 30 24   BUN 73* 77* 79*  CREATININE 3.19* 3.48* 3.77*  GLUCOSE 108* 89 89   Electrolytes  Recent Labs Lab 04/11/16 0427 04/12/16 0446 04/13/16 0918  CALCIUM 8.9 9.0 8.6*  PHOS  --  5.8* 5.9*   Sepsis Markers  Recent Labs Lab 04/10/16 0426  PROCALCITON 0.36   ABG  Recent Labs Lab 04/07/16 0400 04/08/16 0445 04/09/16 0350  PHART 7.39 7.34* 7.48*  PCO2ART 52* 56* 43  PO2ART 105 114* 144*   Liver Enzymes  Recent Labs Lab 04/10/16 0426 04/12/16 0446 04/13/16 0918  AST 19  --   --   ALT 12*  --   --   ALKPHOS 34*  --   --   BILITOT 0.4  --   --   ALBUMIN 2.1* 2.2* 2.2*   Cardiac Enzymes No results for input(s): TROPONINI, PROBNP in the last 168 hours. Glucose  Recent Labs Lab 04/12/16 1150 04/12/16 1607 04/12/16 2337 04/13/16 0351 04/13/16 0735 04/13/16 1458  GLUCAP 89 80 87 82 83 87    CXR: [place]   ASSESSMENT / PLAN: 32 morbidly obese white female with multiorgan dysfunction, with CHF, renal failure and OSA/OHS She will need noninvasive ventilation with BiPAP in order to live and survive and she will die without it Her quality of life is poor  Patient has chronic resp acidosis with pco2 of 52 with near normal pH and she will need oxygen therapy continious  PULMONARY A: S/P respiratory arrest COPD, Smoker  PTA OSA Concern for PE based on RFs and initial presentation Prolonged VDRF P:  On BiPAP  Supplemental O2 to maintain SpO2 > 90 % BiPAP goal:  PRN BiPAP during day Mandatory BiPAP @ night Placed on biPAP for acute resp distress   CARDIOVASCULAR A:  Cardiac arrest due to respiratory arrest Hypertension, controlled Hyperlipidemia P:  Monitor BP and rhythm MAP goal > 65 mmhg -cardiology consult to optimize chf meds s/p NSTEMI  RENAL A:  AKI, oliguric > nonoliguric Hyperkalemia, resolved P:  Monitor BMET intermittently Monitor I/Os Correct electrolytes as indicated Cont IHD per renal service  GASTROINTESTINAL A:  Morbid obesity Abdominal pain, nonspecific - no clear etiology Diarrhea Post extubation dysphagia P:  SUP: IV PPI NPO post extubation SLP eval ordered for 05/01  HEMATOLOGIC A:  ICU acquired anemia with microcytosis - no overt bleeding P:  DVT px: full dose heparin Monitor CBC intermittently Transfuse per usual guidelines D/C Heparin infusion.  INFECTIOUS A:  MRSA PNA - fully treated Possible LE cellulitis - treated Prior hx of  MRSA cellulitis ESBL in the urine - treated P:  Monitor temp, WBC count Completed course of antibiotics.  ENDOCRINE A:  Mild hyperglycemia without prior history of DM Hypothyroidism P:  Change levothyroxine to IV until able to take PO meds Cont SSI - change to sens scale while NPO  NEUROLOGIC A:  Post arrest anoxic encephalopathy, much improved ICU associated discomfort Intermittent agitation Severe deconditioning P:  RASS goal: 0 Continue dexmedetomidine  Minimize sedating meds   Family updated today-will meet again tomorrow for code status and plan of care,  I have personally obtained a history, examined the patient, evaluated Pertinent laboratory and RadioGraphic/imaging results, and  formulated the assessment and plan   The Patient requires high complexity decision making  for assessment and support, frequent evaluation and titration of therapies, application of advanced monitoring technologies and extensive interpretation of multiple databases. Critical Care Time devoted to patient care services described in this note is 40 minutes.   Overall, patient is critically ill, prognosis is guarded.  Patient with Multiorgan failure and at high risk for cardiac arrest and death.    Corrin Parker, M.D.  [facility]  Medical Director [place] Director [facility] Cardio-Pulmonary Department

## 2016-04-13 NOTE — Progress Notes (Signed)
Post dialysis assessment 

## 2016-04-13 NOTE — Progress Notes (Signed)
Pt has remained pain free on my shift. Pt has been interacting with staff appropriately. Alert and oriented x4. Pt is able to help staff turn. Pt remains on Precedex at 0.4. Pt had a bath on my shift.  Pt able to tolerate short spells off Bipap. Pt intake is minimal on my shift. Report given to oncoming nurse.

## 2016-04-13 NOTE — Progress Notes (Signed)
Physical Therapy Treatment Patient Details Name: Tasha Long MRN: [id number] DOB: Jun 19, 1956 Today's Date: 04/13/2016    History of Present Illness Pt is a 60 y.o. F admitted to hospital for chronic diarrhea and IBS with abdominal pain. Pt intubated upon arrival to ED on 4/14, later extubated on 4/30. Pt experienced code Blue cardiac arrest and respiratory arrest while in hospital. Pt received hemodialysis cath on L IJ. Pt uses BiPap at night and PRN during the day. Pt has hx of hypothyroidism, morbid obesity, and asthma.     PT Comments    Pt is not making progress towards goals. Pt able to perform supine there-ex on B LE with no assist and heavy cues. Pt able to perform partial bed mobility with 3+ max assist and use of bed railings. Pt became very anxious during mobility and refused to continue with mobility despite PT trying to convince her. Pt stated she "cannot breathe" and "felt like she was going to die" while performing mobility. Pt on BiPap during there-ex and on 5L O2 [place] during mobility. Pts vitals remained stable t/o all mobility. Pt demonstrates deficits in strength, mobility, ROM and balance. Pt would benefit from further skilled PT to address deficits; recommend pt receive home health PT after discharge from acute hospitalization.   Follow Up Recommendations  Home health PT     Equipment Recommendations  Wheelchair (measurements PT);3in1 (PT);Wheelchair cushion (measurements PT)    Recommendations for Other Services       Precautions / Restrictions Precautions Precautions: Fall Restrictions Weight Bearing Restrictions: No    Mobility  Bed Mobility Overal bed mobility: +2 for physical assistance;Needs Assistance Bed Mobility: Supine to Sit     Supine to sit: Max assist;+2 for physical assistance (+3 for bed mobility)     General bed mobility comments: Pt able to perform partial bed mobility with 3+ max assist. Pt required assist with B LE and to  upright trunk. Pt able to help move B LE and lift hips during mobility. While attempting to bring trunk upright, pt became very anxious and refused to sit up. Pt stated she was "unable to breathe". PT tried to convince pt to re-attempt to sit up, however pt refused. Pts vitals remained stable t/o all mobility.   Transfers                 General transfer comment: Unable to perform transfer d/t pts mobility status.   Ambulation/Gait                 Stairs            Wheelchair Mobility    Modified Rankin (Stroke Patients Only)       Balance                                    Cognition Arousal/Alertness: Awake/alert Behavior During Therapy: Anxious Overall Cognitive Status: Within Functional Limits for tasks assessed                      Exercises Other Exercises Other Exercises: Pt performed supine ther-ex on B LE including ankle pumps, quad sets, and heel slides with no assist. Pt provided heavy cues regarding proper form of exercises. All ther-ex performed x12 reps.     General Comments        Pertinent Vitals/Pain Pain Assessment: Faces Faces Pain Scale: Hurts whole lot Pain Location:  low back  Pain Descriptors / Indicators: Aching Pain Intervention(s): Limited activity within patient's tolerance    Home Living                      Prior Function            PT Goals (current goals can now be found in the care plan section) Acute Rehab PT Goals Patient Stated Goal: to return to PLOF PT Goal Formulation: With patient/family Time For Goal Achievement: 04/26/16 Potential to Achieve Goals: Poor Progress towards PT goals: Not progressing toward goals - comment (Pt still refuses to attempt more mobility. )    Frequency  Min 2X/week    PT Plan Current plan remains appropriate    Co-evaluation             End of Session Equipment Utilized During Treatment: Oxygen Activity Tolerance: Treatment limited  secondary to agitation Patient left: in bed;with call bell/phone within reach     Time: [id number] PT Time Calculation (min) (ACUTE ONLY): 33 min  Charges:                       G Codes:      Sherral Hammers Apr 18, 2016, 4:06 PM M. Barnett Abu, SPT

## 2016-04-13 NOTE — Care Management (Addendum)
Patient is back on bipap and precedex drip.    She has taken in less than three teaspoons of nutrition.    Nephrology  is hoping that patient will not require chronic dialysis but this is uncertain.  She will receive a treatment today to pull off additional fluid and it is hoped this will decrease need for bipap.  Provided attending with the referral checklist for hypoventilation diagnosis for bipap which is different that the copd requirements.  There have been 2 previous consults for palliative care and each time they were cancelled as the timing was not appropriate for the intervention.  It is now felt that palliative care consult  is appropriate for goals of care.  Placed order.  Attending spoke with daughter Marita [place] regarding the possibility of patient requiring reintubation if respiratory status does not improve.  If requires reintubation- patient will require a trach  Topics of discussion- the need for continued dialysis the lack of stretcher dialysis beds in the area- possible need for ongoing ventilator- no snf beds in this area- that patient will not be able to return home because her care needs are going to be extreme- patient unable to maintain her nutrition.

## 2016-04-13 NOTE — Progress Notes (Signed)
Hemodialysis start 

## 2016-04-13 NOTE — Consult Note (Signed)
[place]  CARDIOLOGY CONSULT NOTE  Patient ID: Tasha Long MRN: [id number] DOB/AGE: [birth year] 59 y.o.  Admit date: 03/25/2016 Referring Physician Dr. Mortimer Fries Primary Physician   Primary Cardiologist   Reason for Consultation CHF management  HPI: Pt is a 60 yo female with history of morbid obesity, dm, copd who had been in very poor health. She was admitted 03/25/16 after suffering a respiratory arrest with brief PEA. She was intubated and admitted to ccu. Troponin I peaked at 0.81 felt to be demand related. CXR revealed diffuse interstitial opacity c/w aspiration vs atypical infection rather than chf. She had a prolonged intubataion with cxr occasionally showing evidence of volume overload. Echo was read as showing ef of 45% with mild diffuse hypokinesis with evidence of diastollic dysfunciton. She developed acute on chronic renal failure requiring transient HD. SHe was treated with abx for multilobar pna and sepsis requiring vaso pressors. She is now extubated and breathing on her own and is hemodynamically stable. She denies chest pain but has some sob. She is a difficult historian. Pt remains restless requiring precedex and fentanyl. She is on 4 l per [place]. cxr yesterday revealed possible worseing pulmonary edema. Creatinine 3.77. Urine output is poor although she is -536 over the past 24 hours. .   Review of Systems  Constitutional: Positive for malaise/fatigue.  HENT: Negative.   Eyes: Negative.   Respiratory: Positive for shortness of breath.   Cardiovascular: Positive for leg swelling.  Gastrointestinal: Negative.   Genitourinary: Negative.   Musculoskeletal: Positive for myalgias and joint pain.  Neurological: Positive for weakness.  Endo/Heme/Allergies: Negative.   Psychiatric/Behavioral: Positive for memory loss.    History reviewed. No pertinent past medical history.  History reviewed. No pertinent family history.  Social  History   Social History  . Marital Status: Married    Spouse Name: N/A  . Number of Children: N/A  . Years of Education: N/A   Occupational History  . Not on file.   Social History Main Topics  . Smoking status: Former Research scientist (life sciences)  . Smokeless tobacco: Not on file  . Alcohol Use: Not on file  . Drug Use: Not on file  . Sexual Activity: Not on file   Other Topics Concern  . Not on file   Social History Narrative    History reviewed. No pertinent past surgical history.   Prescriptions prior to admission  Medication Sig Dispense Refill Last Dose  . albuterol (PROVENTIL HFA;VENTOLIN HFA) 108 (90 Base) MCG/ACT inhaler Inhale 2 puffs into the lungs every 4 (four) hours as needed for wheezing or shortness of breath.   prn  . budesonide-formoterol (SYMBICORT) 160-4.5 MCG/ACT inhaler Inhale 2 puffs into the lungs 2 (two) times daily.   Unknown  . busPIRone (BUSPAR) 30 MG tablet Take 30 mg by mouth 2 (two) times daily.   Unknown  . citalopram (CELEXA) 40 MG tablet Take 40 mg by mouth daily.   Unknown  . folic acid (FOLVITE) 1 MG tablet Take 1 tablet by mouth daily.   Unknown  . ipratropium-albuterol (DUONEB) 0.5-2.5 (3) MG/3ML SOLN Take 3 mLs by nebulization 4 (four) times daily as needed.   prn  . levothyroxine (SYNTHROID, LEVOTHROID) 75 MCG tablet Take 75 mcg by mouth daily before breakfast.   Unknown  . omeprazole (PRILOSEC) 20 MG capsule Take 20 mg by mouth daily.   Unknown  . triamcinolone cream (KENALOG) 0.1 % Apply 1 application topically daily.   Unknown  Physical Exam: Blood pressure 126/52, pulse 61, temperature 99.2 F (37.3 C), temperature source Axillary, resp. rate 35, height 5\' 5"  (1.651 m), weight 180.9 kg (398 lb 13 oz), SpO2 99 %.   Wt Readings from Last 1 Encounters:  04/13/16 180.9 kg (398 lb 13 oz)     General appearance: cooperative and slowed mentation Head: Normocephalic, without obvious abnormality, atraumatic Resp: diminished breath sounds  bilaterally Cardio: regular rate and rhythm GI: abnormal findings:  hypoactive bowel sounds Extremities: edema 4+ Neurologic: Mental status: alertness: slowed mentation  Labs:   Lab Results  Component Value Date   WBC 6.5 04/12/2016   HGB 7.2* 04/12/2016   HCT 23.4* 04/12/2016   MCV 75.1* 04/12/2016   PLT 238 04/12/2016    Recent Labs Lab 04/10/16 0426  04/13/16 0918  NA 142  < > 145  K 4.0  < > 4.1  CL 103  < > 107  CO2 28  < > 24  BUN 62*  < > 79*  CREATININE 2.76*  < > 3.77*  CALCIUM 8.8*  < > 8.6*  PROT 6.5  --   --   BILITOT 0.4  --   --   ALKPHOS 34*  --   --   ALT 12*  --   --   AST 19  --   --   GLUCOSE 126*  < > 89  < > = values in this interval not displayed. Lab Results  Component Value Date   CKTOTAL 345* 03/28/2013   CKMB 1.9 03/28/2013   TROPONINI 0.42* 03/25/2016      Radiology: pullmonary edema with copd EKG: nsr  ASSESSMENT AND PLAN:  60 yo with morbid obesity, respiratory failure, copd and diastolic chf admitted with respiratory failure. After prolonged intubation she is now extubaed. She has required pressor support but is off of this now and stable hemodynamically. CXR shows mild pulmonary edema. Has acute on chronic renal failure. Has been on intermittant HD. Would continue ot follow cxr and exam. Consider diiuresis if ok with nephrology but creatinine is still 3.77. Carvedilol as tolerated pressure wise. Poor prognosis.  Signed: Teodoro Spray MD, [facility] 04/13/2016, 10:58 AM

## 2016-04-13 NOTE — Progress Notes (Signed)
[facility]  ROUNDING NOTE   Subjective:  Renal function worsening. Creatinine up to 3.7. BUN currently 79. Urine output was acceptable at 915 however given worsening azotemia we have felt that she may benefit from dialysis.   Objective:  Vital signs in last 24 hours:  Temp:  [98 F (36.7 C)-99.2 F (37.3 C)] 98.8 F (37.1 C) (05/03 1400) Pulse Rate:  [51-84] 84 (05/03 1500) Resp:  [19-50] 44 (05/03 1500) BP: (121-164)/(48-86) 164/64 mmHg (05/03 1500) SpO2:  [93 %-100 %] 95 % (05/03 1500) FiO2 (%):  [30 %-35 %] 35 % (05/03 1401) Weight:  [180.9 kg (398 lb 13 oz)] 180.9 kg (398 lb 13 oz) (05/03 0356)  Weight change: -0.085 kg (-3 oz) Filed Weights   04/11/16 0500 04/12/16 0411 04/13/16 0356  Weight: 179.624 kg (396 lb) 180.985 kg (399 lb) 180.9 kg (398 lb 13 oz)    Intake/Output: I/O last 3 completed shifts: In: 627.1 [I.V.:627.1] Out: 3361 [Urine:1315; Stool:50]   Intake/Output this shift:  Total I/O In: 57 [P.O.:5; I.V.:52] Out: -   Physical Exam: General: Critically ill appearing  Head: Justice/AT hard of hearing OM moist  Eyes: anicteric  Neck: trachea midline  Lungs:  Coarse breath sounds, normal effort  Heart: Regular rate and rhythm  Abdomen:  Soft, nontender, obese, no bowel sounds  Extremities: + peripheral edema.  Neurologic: Awake, alert, will follow commands  Skin: Erythema bilateral lower extremities  Access: Left IJ vascath 4/17 Dr. Mortimer Fries    Basic Metabolic Panel:  Recent Labs Lab 04/09/16 0307 04/10/16 0426 04/11/16 0427 04/12/16 0446 04/13/16 0918  NA 141 142 141 143 145  K 4.2 4.0 4.0 4.0 4.1  CL 102 103 103 105 107  CO2 27 28 27 30 24   GLUCOSE 125* 126* 108* 89 89  BUN 67* 62* 73* 77* 79*  CREATININE 3.28* 2.76* 3.19* 3.48* 3.77*  CALCIUM 8.8* 8.8* 8.9 9.0 8.6*  PHOS  --   --   --  5.8* 5.9*    Liver Function Tests:  Recent Labs Lab 04/10/16 0426 04/12/16 0446 04/13/16 0918  AST 19  --   --   ALT 12*  --   --    ALKPHOS 34*  --   --   BILITOT 0.4  --   --   PROT 6.5  --   --   ALBUMIN 2.1* 2.2* 2.2*   No results for input(s): LIPASE, AMYLASE in the last 168 hours. No results for input(s): AMMONIA in the last 168 hours.  CBC:  Recent Labs Lab 04/08/16 0539 04/09/16 0307 04/10/16 0435 04/11/16 0427 04/12/16 0446  WBC 8.0 8.5 7.5 6.6 6.5  HGB 7.2* 7.9* 7.6* 7.3* 7.2*  HCT 22.8* 24.5* 24.6* 23.6* 23.4*  MCV 75.0* 76.5* 77.0* 74.8* 75.1*  PLT 211 225 224 231 238    Cardiac Enzymes: No results for input(s): CKTOTAL, CKMB, CKMBINDEX, TROPONINI in the last 168 hours.  BNP: Invalid input(s): POCBNP  CBG:  Recent Labs Lab 04/12/16 1607 04/12/16 2337 04/13/16 0351 04/13/16 0735 04/13/16 1458  GLUCAP 80 87 82 83 87    Microbiology: Results for orders placed or performed during the hospital encounter of 03/25/16  Culture, blood (routine x 2)     Status: None   Collection Time: 03/25/16  7:19 AM  Result Value Ref Range Status   Specimen Description BLOOD LEFT FOREARM  Final   Special Requests BOTTLES DRAWN AEROBIC AND ANAEROBIC  [place]  Final   Culture NO GROWTH 5  DAYS  Final   Report Status 03/30/2016 FINAL  Final  Culture, blood (routine x 2)     Status: None   Collection Time: 03/25/16  7:19 AM  Result Value Ref Range Status   Specimen Description BLOOD LEFT HAND  Final   Special Requests BOTTLES DRAWN AEROBIC AND ANAEROBIC  1CC  Final   Culture NO GROWTH 5 DAYS  Final   Report Status 03/30/2016 FINAL  Final  MRSA PCR Screening     Status: Abnormal   Collection Time: 03/25/16 12:42 PM  Result Value Ref Range Status   MRSA by PCR POSITIVE (A) NEGATIVE Final    Comment:        The GeneXpert MRSA Assay (FDA approved for NASAL specimens only), is one component of a comprehensive MRSA colonization surveillance program. It is not intended to diagnose MRSA infection nor to guide or monitor treatment for MRSA infections. CRITICAL RESULT CALLED TO, READ BACK BY AND VERIFIED  WITH:  AMELIA BERRY AT 1644 03/25/16 SDR   Urine culture     Status: Abnormal   Collection Time: 03/26/16  1:02 AM  Result Value Ref Range Status   Specimen Description URINE, RANDOM  Final   Special Requests NONE  Final   Culture (A)  Final    20,000 COLONIES/mL ESCHERICHIA COLI Results Called to: AMELIA BERRY, RN  AT 1055 ON 03/28/16 BY CTJ. ESBL-EXTENDED SPECTRUM BETA LACTAMASE-THE ORGANISM IS RESISTANT TO PENICILLINS, CEPHALOSPORINS AND AZTREONAM ACCORDING TO CLSI M100-S15 VOL.[place].    Report Status 03/28/2016 FINAL  Final   Organism ID, Bacteria ESCHERICHIA COLI (A)  Final      Susceptibility   Escherichia coli - MIC*    AMPICILLIN >=32 RESISTANT Resistant     CEFAZOLIN >=64 RESISTANT Resistant     CEFTRIAXONE >=64 RESISTANT Resistant     CIPROFLOXACIN >=4 RESISTANT Resistant     GENTAMICIN <=1 SENSITIVE Sensitive     IMIPENEM <=0.25 SENSITIVE Sensitive     NITROFURANTOIN <=16 SENSITIVE Sensitive     TRIMETH/SULFA >=320 RESISTANT Resistant     AMPICILLIN/SULBACTAM 16 INTERMEDIATE Intermediate     PIP/TAZO <=4 SENSITIVE Sensitive     Extended ESBL POSITIVE Resistant     * 20,000 COLONIES/mL ESCHERICHIA COLI  Culture, respiratory (tracheal aspirate)     Status: None   Collection Time: 03/30/16  8:50 AM  Result Value Ref Range Status   Specimen Description TRACHEAL ASPIRATE  Final   Special Requests NONE  Final   Gram Stain FEW WBC SEEN RARE GRAM NEGATIVE RODS   Final   Culture   Final    MODERATE GROWTH METHICILLIN RESISTANT STAPHYLOCOCCUS AUREUS LIGHT GROWTH OCHROBACTRUM ANTHROPI MODERATE GROWTH GRAM NEGATIVE RODS UNABLE TO FURTHER ID BETA LACTAMASE POSITIVE    Report Status 04/05/2016 FINAL  Final   Organism ID, Bacteria METHICILLIN RESISTANT STAPHYLOCOCCUS AUREUS  Final      Susceptibility   Methicillin resistant staphylococcus aureus - MIC*    CIPROFLOXACIN <=0.5 SENSITIVE Sensitive     ERYTHROMYCIN >=8 RESISTANT Resistant     GENTAMICIN <=0.5  SENSITIVE Sensitive     OXACILLIN >=4 RESISTANT Resistant     TETRACYCLINE 2 SENSITIVE Sensitive     VANCOMYCIN 1 SENSITIVE Sensitive     TRIMETH/SULFA >=320 RESISTANT Resistant     CLINDAMYCIN >=8 RESISTANT Resistant     RIFAMPIN <=0.5 SENSITIVE Sensitive     Inducible Clindamycin NEGATIVE Sensitive     * MODERATE GROWTH METHICILLIN RESISTANT STAPHYLOCOCCUS AUREUS    Coagulation  Studies: No results for input(s): LABPROT, INR in the last 72 hours.  Urinalysis: No results for input(s): COLORURINE, LABSPEC, PHURINE, GLUCOSEU, HGBUR, BILIRUBINUR, KETONESUR, PROTEINUR, UROBILINOGEN, NITRITE, LEUKOCYTESUR in the last 72 hours.  Invalid input(s): APPERANCEUR    Imaging: Dg Chest Port 1 View  04/12/2016  CLINICAL DATA:  Post PICC line placement EXAM: PORTABLE CHEST 1 VIEW COMPARISON:  04/10/2016 FINDINGS: Interval placement of right jugular approach dual lumen PICC line with tip projected over at least the superior cavoatrial junction. Interval extubation and removal of enteric tube. Otherwise, stable positioning remaining support apparatus including tip of right subclavian vein approach central venous catheter projected over the superior SVC. No supine evidence of pneumothorax. Grossly unchanged enlarged cardiac silhouette and mediastinal contours with atherosclerotic plaque within the thoracic aorta. Pulmonary vasculature appears less distinct than present examination with cephalization of flow. Veiling opacities overlying the bilateral lower lungs are favored to represent overlying breast tissues. No focal airspace opacities. No supine evidence of pleural effusion. No pneumothorax. IMPRESSION: 1. Right jugular approach dual lumen PICC line tip projects over the superior cavoatrial junction. No supine evidence of pneumothorax. 2. Interval extubation and removal of enteric tube. 3. Findings suggestive of worsening pulmonary edema. Electronically Signed   By: Sandi Mariscal M.D.   On: 04/12/2016 21:44      Medications:   . dexmedetomidine 0.041 mcg/kg/hr (04/13/16 1500)   . budesonide (PULMICORT) nebulizer solution  0.25 mg Nebulization 4 times per day  . heparin subcutaneous  5,000 Units Subcutaneous Q8H  . insulin aspart  0-9 Units Subcutaneous Q4H  . ipratropium-albuterol  3 mL Nebulization Q6H  . levothyroxine  37.5 mcg Intravenous Daily  . pantoprazole (PROTONIX) IV  40 mg Intravenous Q24H   sodium chloride, albuterol, fentaNYL (SUBLIMAZE) injection, ondansetron (ZOFRAN) IV, sodium chloride flush  Assessment/ Plan:  Ms. Tasha Long is a 60 y.o. 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   1. Acute renal failure on chronic kidney disease stage III with baseline creatinine of 1.2, eGFR of 49:   Secondary to ATN. With elevated vancomycin levels. HD was 4/22, 4/26,4/27, 4/28.  CT scan abd/pelvis negative for hydronephrosis. - patient having some shortness of breath today.  BUN and creatinine are higher.  Therefore we will proceed with dialysis treatment today.  Ultrafiltration target 2 kg.  2. Acute respiratory failure: Pt extubated 04/10/16. - patient still having some shortness of breath.  Requires periodic BiPAP.  We will plan for ultrafiltration with dialysis and target 2 kg.  3. Sepsis/Hypotension secondary to ESBL E. Coli Urinary tract infection and pneumonia (sputum with MRSA):  blood pressure appears to have stabilized and in fact slightly high at the moment.  Continue to monitor.    4. Generalized edema - we will plan for ultrafiltration as above.   5. Morbid obesity - baseline functional status - Bed to chair    LOS: [place], Tasha Long 5/3/20174:15 PM

## 2016-04-13 NOTE — Progress Notes (Addendum)
This note also relates to the following rows which could not be included: BP - Cannot attach notes to unvalidated device data   Post-dialysis assessment. B?P rechecked 153/59

## 2016-04-14 LAB — BLOOD GAS, ARTERIAL
Acid-Base Excess: 8 mmol/L — ABNORMAL HIGH (ref 0.0–3.0)
Allens test (pass/fail): POSITIVE — AB
BICARBONATE: 33.4 meq/L — AB (ref 21.0–28.0)
Delivery systems: POSITIVE
EXPIRATORY PAP: 5
FIO2: 0.35
Inspiratory PAP: 10
O2 SAT: 96.5 %
PATIENT TEMPERATURE: 37
PH ART: 7.4 (ref 7.350–7.450)
PO2 ART: 86 mmHg (ref 83.0–108.0)
RATE: 12 resp/min
pCO2 arterial: 54 mmHg — ABNORMAL HIGH (ref 32.0–48.0)

## 2016-04-14 LAB — PHOSPHORUS: Phosphorus: 4.2 mg/dL (ref 2.5–4.6)

## 2016-04-14 LAB — COMPREHENSIVE METABOLIC PANEL
ALT: 9 U/L — ABNORMAL LOW (ref 14–54)
ANION GAP: 8 (ref 5–15)
AST: 15 U/L (ref 15–41)
Albumin: 2.1 g/dL — ABNORMAL LOW (ref 3.5–5.0)
Alkaline Phosphatase: 28 U/L — ABNORMAL LOW (ref 38–126)
BILIRUBIN TOTAL: 0.8 mg/dL (ref 0.3–1.2)
BUN: 46 mg/dL — AB (ref 6–20)
CO2: 29 mmol/L (ref 22–32)
Calcium: 8.3 mg/dL — ABNORMAL LOW (ref 8.9–10.3)
Chloride: 105 mmol/L (ref 101–111)
Creatinine, Ser: 2.7 mg/dL — ABNORMAL HIGH (ref 0.44–1.00)
GFR calc Af Amer: 21 mL/min — ABNORMAL LOW (ref >60)
GFR, EST NON AFRICAN AMERICAN: 18 mL/min — AB (ref >60)
Glucose, Bld: 104 mg/dL — ABNORMAL HIGH (ref 65–99)
POTASSIUM: 3.9 mmol/L (ref 3.5–5.1)
Sodium: 142 mmol/L (ref 135–145)
TOTAL PROTEIN: 6.2 g/dL — AB (ref 6.5–8.1)

## 2016-04-14 LAB — TROPONIN I
TROPONIN I: 0.04 ng/mL — AB (ref <0.031)
Troponin I: 0.04 ng/mL — ABNORMAL HIGH (ref <0.031)
Troponin I: 0.04 ng/mL — ABNORMAL HIGH (ref <0.031)

## 2016-04-14 LAB — GLUCOSE, CAPILLARY
GLUCOSE-CAPILLARY: 85 mg/dL (ref 65–99)
GLUCOSE-CAPILLARY: 92 mg/dL (ref 65–99)
Glucose-Capillary: 91 mg/dL (ref 65–99)
Glucose-Capillary: 79 mg/dL (ref 65–99)
Glucose-Capillary: 79 mg/dL (ref 65–99)

## 2016-04-14 LAB — MAGNESIUM: MAGNESIUM: 1.7 mg/dL (ref 1.7–2.4)

## 2016-04-14 MED ORDER — CLINIMIX/DEXTROSE (5/15) 5 % IV SOLN
INTRAVENOUS | Status: DC
  Administered 2016-04-14: 20:00:00 via INTRAVENOUS
  Filled 2016-04-14 (×2): qty 960

## 2016-04-14 NOTE — Progress Notes (Addendum)
PARENTERAL NUTRITION CONSULT NOTE - INITIAL  Pharmacy Consult for TPN Electrolyte and Glucose Monitoring   Allergies  Allergen Reactions  . Codeine Hives  . Sulfa Antibiotics Hives    Patient Measurements: Height: 5\' 5"  (165.1 cm) Weight: (!) 374 lb (169.645 kg) IBW/kg (Calculated) : 57  Vital Signs: Temp: 97.8 F (36.6 C) (05/04 1600) Temp Source: Axillary (05/04 1600) BP: 146/60 mmHg (05/04 1618) Pulse Rate: 51 (05/04 1618) Intake/Output from previous day: 05/03 0701 - 05/04 0700 In: 174.1 [P.O.:5; I.V.:169.1] Out: 2400  Intake/Output from this shift: Total I/O In: 109.3 [I.V.:109.3] Out: 210 [Urine:85; Stool:125]  Labs:  Recent Labs  04/12/16 0446  WBC 6.5  HGB 7.2*  HCT 23.4*  PLT 238     Recent Labs  04/12/16 0446 04/13/16 0918 04/14/16 1650  NA 143 145 142  K 4.0 4.1 3.9  CL 105 107 105  CO2 30 24 29   GLUCOSE 89 89 104*  BUN 77* 79* 46*  CREATININE 3.48* 3.77* 2.70*  CALCIUM 9.0 8.6* 8.3*  MG  --   --  1.7  PHOS 5.8* 5.9* 4.2  PROT  --   --  6.2*  ALBUMIN 2.2* 2.2* 2.1*  AST  --   --  15  ALT  --   --  9*  ALKPHOS  --   --  28*  BILITOT  --   --  0.8   Estimated Creatinine Clearance: 36.6 mL/min (by C-G formula based on Cr of 2.7).    Recent Labs  04/14/16 0732 04/14/16 1155 04/14/16 1610  GLUCAP 79 79 85    Medical History: History reviewed. No pertinent past medical history.  Medications:  Scheduled:  . budesonide (PULMICORT) nebulizer solution  0.25 mg Nebulization 4 times per day  . heparin subcutaneous  5,000 Units Subcutaneous Q8H  . insulin aspart  0-9 Units Subcutaneous Q4H  . ipratropium-albuterol  3 mL Nebulization Q6H  . levothyroxine  37.5 mcg Intravenous Daily  . pantoprazole (PROTONIX) IV  40 mg Intravenous Q24H   Infusions:  . dexmedetomidine 0.5 mcg/kg/hr (04/14/16 1228)  . TPN (CLINIMIX) Adult without lytes      Insulin Requirements in the past 24 hours:    Current Nutrition:  Clinimix 5/15 without  electrolytes  Assessment: Pharmacy consulted to assist in monitoring electrolytes and glucose in this 60 y/o F on continuous BiPap unable to take PO. Patient on HD prn.   5/4 @ 16:50 :   K = 3.9                      Mag = 1.7                      Phos = 4.2                         Ca = 8.3 ,  Alb = 2.1 , Corrected Ca = 9.82               Plan:  Patient currently on SSI. Will f/u BS. Will order labs and replace electrolytes as needed. Will need to replace conservatively in this patient on dialysis.   5/4:  All electrolytes are WNL .   No further supplementation needed.  Will recheck electrolytes on 5/5 with AM labs.   Tasha Long D 04/14/2016,5:48 PM

## 2016-04-14 NOTE — Progress Notes (Signed)
PULMONARY / CRITICAL CARE MEDICINE   Name: Tiena Hires MRN: [id number] DOB: 1956/04/14    ADMISSION DATE:  03/25/2016     PT PROFILE:  61 F in chronically very poor health developed abdominal pain and EMS was dispatched. Suffered respiratory arrest and brief cardiac arrest in the transfer from her bed to wheelchair and into ambulance. Intubated and transported to ED. Suffered brief PEA arrest again in ICU shortly after arrival  SUBJECTIVE:  Patient placed on BiPAP for worsening resp distress,in resp  distress at this time.patient attempted to take off but had increased WOB   MAJOR EVENTS/TEST RESULTS: 04/14 TTE: 0000000, G1 diastrolic dysfcn, mild LA dil, mod RV dil, mild RA Dil. 4 chamber dilation, with mod sys dysfcn and mild diastolic dysfcn w/diffuse hypokinesis 04/14 LE venous US: poor quality study due to body habitus. No DVT seen 04/14 abdomen US: IMPRESSION: 1. No explanation for abdominal pain. Bowel gas obscures the lower abdominal aorta. Cholecystectomy. ECHO: EF 45%, diffuse hypokinesis, grade 1 diastolic dysfunction [id number] CT chest (wihtout contrast): Severe multilobar bronchopneumonia throughout the lungs bilaterally. Multiple borderline enlarged and minimally enlarged mediastinal and bilateral hilar lymph nodes, presumably reactive. Mild dilatation of the pulmonic trunk (3.5 cm in diameter), suggestive of pulmonary arterial hypertension 04/15 CTAP: No acute pathology 04/16 Renal Consultation 04/17 CT head: Minimal diffuse cortical atrophy. Mild chronic ischemic white matter disease. Mild bilateral maxillary and ethmoid sinusitis. No acute intracranial abnormality seen 04/18 EEG: EEG secondary to general background slowing. This finding may be seen with a diffuse disturbance that is etiologically nonspecific, but may include a metabolic encephalopathy, among other possibilities. No epileptiform activity was noted 04/19 Neurology consultation: Prognosis for  neurological recovery deemed guarded 04/21 intermittent HD initiated 04/26 received one unit PRBCs for Hgb 6.9 04/27 ENT consultation for planned trach tube placement 04/30 Dramatic improvement in neurologic function. RASS 0. + F/C. Passed SBT and extubated. Tolerated initially 5/1 on biPAP, wean as tolerated, high risk for intubation 5/3 back on biPAP dialysis  Removed 2.5 L 5/4 back on biPAP,   INDWELLING DEVICES:: ETT 04/14 >> 04/30 R Bartolo CVL 04/14 >>  L IJ HD cath 04/17 >>5/2 PiCC line 5/2   MICRO DATA: MRSA PCR 04/14 >> POS Urine 04/14 >> ESBL E coli (sens only to imipenem, pip-tazo, gent) Resp 04/14 >> few gram negative rods  Blood 04/14 >> NEG  Resp 04/19>>  MRSA  PCT algorithm 04/14-04/16: < 0.10, 1.48, 1.59 PCT 04/30: 0.36  ANTIMICROBIALS:  Zosyn 4/14 >> 4/14 Cefepime 4/14 >> 4/17 Metronidazole 4/14 >> 4/17 Meropenem 4/17 >> 04/29 vancomycin 4/14 >> 04/29   VITAL SIGNS: Temp:  [98 F (36.7 C)-99 F (37.2 C)] 99 F (37.2 C) (05/04 0500) Pulse Rate:  [55-108] 62 (05/04 0600) Resp:  [19-44] 21 (05/04 0600) BP: (109-188)/(42-148) 129/48 mmHg (05/04 0600) SpO2:  [92 %-100 %] 92 % (05/04 0803) FiO2 (%):  [35 %] 35 % (05/04 0400) Weight:  [374 lb (169.645 kg)] 374 lb (169.645 kg) (05/04 0402) HEMODYNAMICS:   VENTILATOR SETTINGS: Vent Mode:  [-]  FiO2 (%):  [35 %] 35 % INTAKE / OUTPUT:  Intake/Output Summary (Last 24 hours) at 04/14/16 0902 Last data filed at 04/14/16 0500  Gross per 24 hour  Intake 102.45 ml  Output   2400 ml  Net -2297.55 ml    Review of Systems  Unable to perform ROS: acuity of condition  Constitutional: Positive for malaise/fatigue.  Respiratory: Positive for shortness of breath and wheezing.   Psychiatric/Behavioral:  The patient is nervous/anxious.   All other systems reviewed and are negative.   Physical Exam  Constitutional: She appears distressed.  Eyes: Pupils are equal, round, and reactive to light.  Neck: Normal  range of motion. Neck supple.  Cardiovascular: Normal rate and regular rhythm.   No murmur heard. Pulmonary/Chest: She is in respiratory distress. She has no wheezes. She has rales.  Abdominal: Soft. Bowel sounds are normal.  Musculoskeletal: She exhibits edema.  Neurological: She is alert. No cranial nerve deficit.  Skin: Skin is warm. She is diaphoretic.    LABS:  CBC  Recent Labs Lab 04/10/16 0435 04/11/16 0427 04/12/16 0446  WBC 7.5 6.6 6.5  HGB 7.6* 7.3* 7.2*  HCT 24.6* 23.6* 23.4*  PLT 224 231 238   Coag's No results for input(s): APTT, INR in the last 168 hours. BMET  Recent Labs Lab 04/11/16 0427 04/12/16 0446 04/13/16 0918  NA 141 143 145  K 4.0 4.0 4.1  CL 103 105 107  CO2 27 30 24   BUN 73* 77* 79*  CREATININE 3.19* 3.48* 3.77*  GLUCOSE 108* 89 89   Electrolytes  Recent Labs Lab 04/11/16 0427 04/12/16 0446 04/13/16 0918  CALCIUM 8.9 9.0 8.6*  PHOS  --  5.8* 5.9*   Sepsis Markers  Recent Labs Lab 04/10/16 0426  PROCALCITON 0.36   ABG  Recent Labs Lab 04/08/16 0445 04/09/16 0350  PHART 7.34* 7.48*  PCO2ART 56* 43  PO2ART 114* 144*   Liver Enzymes  Recent Labs Lab 04/10/16 0426 04/12/16 0446 04/13/16 0918  AST 19  --   --   ALT 12*  --   --   ALKPHOS 34*  --   --   BILITOT 0.4  --   --   ALBUMIN 2.1* 2.2* 2.2*   Cardiac Enzymes No results for input(s): TROPONINI, PROBNP in the last 168 hours. Glucose  Recent Labs Lab 04/13/16 1458 04/13/16 1630 04/13/16 1933 04/13/16 2319 04/14/16 0333 04/14/16 0732  GLUCAP 87 84 77 74 91 79    CXR: [place]   ASSESSMENT / PLAN: 29 morbidly obese white female with multiorgan dysfunction, with CHF, renal failure and OSA/OHS She will need noninvasive ventilation with BiPAP in order to live and survive and she will die without it Her quality of life is poor  Patient has chronic resp acidosis with pco2 of 52 with near normal pH and she will need oxygen therapy  continious  PULMONARY A: S/P respiratory arrest COPD, Smoker PTA OSA Concern for PE based on RFs and initial presentation Prolonged VDRF P:  On BiPAP  Supplemental O2 to maintain SpO2 > 90 % BiPAP goal:  PRN BiPAP during day Mandatory BiPAP @ night Placed on biPAP for acute resp distress again   CARDIOVASCULAR A:  Cardiac arrest due to respiratory arrest Hypertension, controlled Hyperlipidemia P:  Monitor BP and rhythm MAP goal > 65 mmhg -cardiology consult to optimize chf meds s/p NSTEMI  RENAL A:  AKI, oliguric > nonoliguric Hyperkalemia, resolved P:  Monitor BMET intermittently Monitor I/Os Correct electrolytes as indicated Cont IHD per renal service  GASTROINTESTINAL A:  Morbid obesity Abdominal pain, nonspecific - no clear etiology Diarrhea Post extubation dysphagia P:  SUP: IV PPI NPO post extubation SLP eval ordered for 05/01  HEMATOLOGIC A:  ICU acquired anemia with microcytosis - no overt bleeding P:  DVT px: full dose heparin Monitor CBC intermittently Transfuse per usual guidelines D/C Heparin infusion.  INFECTIOUS A:  MRSA PNA - fully treated Possible  LE cellulitis - treated Prior hx of MRSA cellulitis ESBL in the urine - treated P:  Monitor temp, WBC count Completed course of antibiotics.  ENDOCRINE A:  Mild hyperglycemia without prior history of DM Hypothyroidism P:  Change levothyroxine to IV until able to take PO meds Cont SSI - change to sens scale while NPO  NEUROLOGIC A:  Post arrest anoxic encephalopathy, much improved ICU associated discomfort Intermittent agitation Severe deconditioning P:  RASS goal: 0 Continue dexmedetomidine  Minimize sedating meds   Family updated today-will meet again today for code status and plan of care,prognosis is very poor  I have personally obtained a history, examined the patient, evaluated Pertinent laboratory and RadioGraphic/imaging results, and   formulated the assessment and plan   The Patient requires high complexity decision making for assessment and support, frequent evaluation and titration of therapies, application of advanced monitoring technologies and extensive interpretation of multiple databases. Critical Care Time devoted to patient care services described in this note is 40 minutes.   Overall, patient is critically ill, prognosis is guarded.  Patient with Multiorgan failure and at high risk for cardiac arrest and death.    Corrin Parker, M.D.  [facility]  Medical Director [place] Director [facility] Cardio-Pulmonary Department

## 2016-04-14 NOTE — Progress Notes (Signed)
Pt is stable condition at this time. Pt is unable to tolerate removing the bipap at this time to eat or swab mouth. Pt is alert and oriented x4. Pt reported new chest pain at 7/10 Dr. Mortimer Fries notified new orders given . Family at bedside. Report given to oncoming RN, Brittney T.

## 2016-04-14 NOTE — Progress Notes (Signed)
[facility] transfer center called to confirm that transfer had been canceled.  I did not receive any information on pt. Still wanting to be transferred at this time.  Charge nurse, Hiral was asked as well and to her knowledge pt is not at this time being transferred.

## 2016-04-14 NOTE — Progress Notes (Signed)
PARENTERAL NUTRITION CONSULT NOTE - INITIAL  Pharmacy Consult for TPN Electrolyte and Glucose Monitoring   Allergies  Allergen Reactions  . Codeine Hives  . Sulfa Antibiotics Hives    Patient Measurements: Height: 5\' 5"  (165.1 cm) Weight: (!) 374 lb (169.645 kg) IBW/kg (Calculated) : 57  Vital Signs: Temp: 98.8 F (37.1 C) (05/04 1200) Temp Source: Axillary (05/04 1200) BP: 146/41 mmHg (05/04 1400) Pulse Rate: 59 (05/04 1400) Intake/Output from previous day: 05/03 0701 - 05/04 0700 In: 174.1 [P.O.:5; I.V.:169.1] Out: 2400  Intake/Output from this shift: Total I/O In: 109.3 [I.V.:109.3] Out: 210 [Urine:85; Stool:125]  Labs:  Recent Labs  04/12/16 0446  WBC 6.5  HGB 7.2*  HCT 23.4*  PLT 238     Recent Labs  04/12/16 0446 04/13/16 0918  NA 143 145  K 4.0 4.1  CL 105 107  CO2 30 24  GLUCOSE 89 89  BUN 77* 79*  CREATININE 3.48* 3.77*  CALCIUM 9.0 8.6*  PHOS 5.8* 5.9*  ALBUMIN 2.2* 2.2*   Estimated Creatinine Clearance: 26.2 mL/min (by C-G formula based on Cr of 3.77).    Recent Labs  04/14/16 0333 04/14/16 0732 04/14/16 1155  GLUCAP 91 79 79    Medical History: History reviewed. No pertinent past medical history.  Medications:  Scheduled:  . budesonide (PULMICORT) nebulizer solution  0.25 mg Nebulization 4 times per day  . heparin subcutaneous  5,000 Units Subcutaneous Q8H  . insulin aspart  0-9 Units Subcutaneous Q4H  . ipratropium-albuterol  3 mL Nebulization Q6H  . levothyroxine  37.5 mcg Intravenous Daily  . pantoprazole (PROTONIX) IV  40 mg Intravenous Q24H   Infusions:  . dexmedetomidine 0.5 mcg/kg/hr (04/14/16 1228)  . TPN (CLINIMIX) Adult without lytes      Insulin Requirements in the past 24 hours:    Current Nutrition:  Clinimix 5/15 without electrolytes  Assessment: Pharmacy consulted to assist in monitoring electrolytes and glucose in this 60 y/o F on continuous BiPap unable to take PO. Patient on HD prn.   Plan:   Patient currently on SSI. Will f/u BS. Will order labs and replace electrolytes as needed. Will need to replace conservatively in this patient on dialysis.   Ulice Dash D 04/14/2016,3:59 PM

## 2016-04-14 NOTE — Consult Note (Signed)
WOC wound follow up Wound type: Bilateral Unnas boots to lower legs.  Cellulitis resolving.  Measurement:Skin intact erythema is resolving Wound [id number] Drainage (amount, consistency, odor) Generalized weeping Periwound:Dry skin.  Chronic skin changes Dressing procedure/placement/frequency:Cleanse skin with soap and water.  Apply zinc layer and secure with Coban.  Change weekly on Thursday.  Will not follow at this time.  Please re-consult if needed.  Domenic Moras RN BSN [place] Pager [phone]

## 2016-04-14 NOTE — Progress Notes (Signed)
Speech Therapy Note: reviewed chart notes; consulted NSG and Dietician re: pt's status today. Pt is unable to have BiPAP removed for po's d/t declined respiratory status. NSG will continue to monitor pt's status for appropriateness for such. MD/care team aware. ST will f/u w/ toleration of po consistencies when able to take po's; education on aspiration precautions. Recommend oral care as able. NSG agreed.

## 2016-04-14 NOTE — Consult Note (Signed)
Palliative Medicine Inpatient Consult Note   Name: Tasha Long Date: 04/14/2016 MRN: [id number]  DOB: May 31, 1956  Referring Physician: Wilhelmina Mcardle, MD  Palliative Care consult requested for this 60 y.o. female for goals of medical therapy in patient with acute on chronic respiratory failure.   DISCUSSIONS AND RECOMMENDATIONS: I first met with one daughter and a son Steffanie Dunn and Jenny Reichmann).  She seemed quite reasonable this morning and wanted to know more details about what exactly was wrong with her mother.  For instance, she said, she did not want to hear vague terms like 'your mom is really sick'. She didn't want to hear 'COPD' but wanted to know what stage of COPD.  She wanted to know why she wasn't being dialyzed today given little urine output (and wanted to know what the kidney doctor looks at to make his decisions etc).  Some of her questions were quite reasonable and I offered to sit down with the family and go over the medical conditions and give her whatever info I could.  We set the meeting for 3pm.  Unfortunately, the other daughter was sick and could not come here today to meet but she was able to be present by speaker phone.  During our meeting, when I was talking, this daughter frequently said things that were quite hostile. She used the phrase 'y'all' repeatedly --making accusations of the wrong things being done etc etc.  She was not receptive at all to hearing any of the real facts about her mother but instead was focused on placing blame.  No one was rude to me, but there were some inappropriate words used by daughter on the phone and the daughter who was present was largely silent --though crying and gesturing --the entire time.    They seemed to not be able to address anything I brought up.  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    ----TPN being risky                   ---They don't care feel the risk is more of a problem than her not eating                   ---Daughter on the phone was very angry about feeding issues    ----TPN being short term                   ---They want her to eat real food and believe she will if 'she gets the right                      Care'.  They said she 'got better at [facility]' when she was there in Jan     ---- Pt not being able to stay on TPN indefinitely and she would need a permanent way to be fed like a feeding tube (which we cannot place due to her resp conditions)                    They all just said 'She can't be put under' so 'no way'    ----BIPAP not being a way to live                   ---Again, they have the belief that she could improve at [facility]    ---- Re-intubation looming and pt might not want this along with [facility]                   ---They say she would not want a trach --but did not say 'no intubation'                   ---instead, it was more about what all 'Y'ALL did and didn't do    ----Dialysis on a stretcher necessitating out of state placement                   ----she is a 'fighter' and because her she was not 'brain dead' like we said,                    Her kidneys will get better also.  I responded and explained no one ever                    Michela Pitcher she was 'brain dead'.  But this did not soften  their judgment that                     Y'ALL were ALL WRONG about her brain.    ----Dialysis may not be short term (no way of knowing) but Cr has been on a roller-coaster and is starting to go back up again despite dialysis . Instead of steadily coming down, it is going up. This is indicative that she may need longer term dialysis or possibly permanent dialysis.   I let them know I do not feel that she has a life expectancy beyond six months and that right now, she is at a point where she could die anytime (again).   .  There was no acknowledgement that there is any need to think ahead. They only want [place] to continue 'doing what we are doing'. And they will make decisions day by day.  AND they will  be thinking about where they want her transferred to now. I mentioned that it appears that [facility] cancelled the transfer request after not having a bed for som many days continuously.   I am not certain of just how this happened.   That is how I interpreted the nursing note referencing this matter on 04/14/16. Family was not happy that she is not any longer on a list for transferring out --and they will think about where they want to ask her to be transferred next.   I have updated nursing and Dr. Mortimer Fries, that I attempted to inform family, but it was not the meeting I had hoped to have. I was not able to explain to Kristi anything she had asked about earlier.   I will follow with you.  _______________________________________________   CLINICAL NARRATIVE: Pt is 60 yo woman who is chronically ill due to COPD and Obesity Hypoventilation, who felt cold and short of breath and EMS was called.  She presented to the ED by EMS after having brief cardiac arrest during the transfer from her bed to wheelchair and into the ambulance from home. There was delay of EMS due to a stuck vehicle in the driveway.  She was intubated and transported to the ED and suffered brief PEA arrest again in the ICU shortly after arrival.  This  improved after 2 minutes of CPR.   Potassium then was 6.2.    She had been at [facility] for 3 weeks for resp failure and acute renal failure  Here she was treated for possible pneumonia and PE was thought possible.  She was anticoagulated empirically for Possible PE.She had elevated PCT (procalcitonin).  She had bright red blood from mouth on 4/15. She was on vanco, Cefipime, and flagyl. She became anuric and Creatinine increased.   .he required Levophed for pressor support.   She was off pressors by 4/19 but had not purposeful movements.  On 4/20, she had was unresponsive and was not sedated.  Family was asking for transfer to [place].   But there were no beds there.  Pressors were off by 4/21.  She was started on CRRT and then changed to intermittent HD.  Pt started to be more responsive by 4/23.  Moved arms but would not track or follow commands consistently. Foley, rectal tube, and central line all in place.  On 4/24, she started opening her eyes more. She continued on dialysis and an NG feeding tube and was on heparin drip.  Concern for trache being needed on day 14 of ventilation.  She had an 18 beat run of VT on 4/24.   Pts neuro status improved to the point where she was following commands but made no attempts at speech on 4/24.  She moved all extremities.  A low dose lasix drip helped with urine output. She required transfusion.  She began to [facility] on 4/27.  ENT was consulted on 4/27 re: Trache. Trache planned for 5/2 with Heparin to be on hold.   The concerns for disposition after hospital stay have brought up the facts that 'stretcher dialysis' would cause a challenge to discharge (she might have to go out of state for HD  / placement). She does not qualify for long term acute care.    IMPRESSION: Cardiac arrest ---due to resp failure and septic shock Acute on chronic hypercapneic resp failure.  ---due to multilobar pneumonia, COPD, obesity hypoventilation, OSA  ---tracheal  aspirate was positive for MRSA Septic shock ---due  to ESBL e coli UTI (only 20k colonies however) Chronic respiratory failure COPD  Obesity Obesity Malnutrition (overnourished but now with low albumin and not eating) OSA Bipolar disorder, anxiety  H/O MRSA cellulitis IBS, GERD - Omeprazole Htn - Lisinopril Hyperlipidemia  Hypothyroidism  Severe lymphedema with stasis dermatitis ---was seen at wound center for this  Morbid Obesity --with hypoalbuminemia / malnutrition --mod/ severe CKD stage 3 with recurrent acute renal failure IBS with chronic diarrhea Anxiety disorder GERD Hearing deficit HTN Back Pain Tobacco smoking Varicose veins Sleep apnea/ OSA requiring CPAP Anemia Possible Pneumonia Possible LE cellulitis with prior h/o MRSA cellulitis ---MRSA PCR positive here. No h/o DM but suspect insulin resistance  Anoxic encephalopathy Mild bilateral maxillary and ethmoid sinusitis Dysphagia Cardiomyopathy  ----systolic with EF 88% Acute systolic CHF Presumed PE --no CT study of chest done due to ARF   ---treated with heparin drip VAGINAL BLEED DESPITE H/O HYSTERECTOMY?     --I will inquire further about this (no bleeding now)    PREVIOUS MEDICAL HISTORY: COPD, CKD STAGE 3, dyslipidemia, htn, ibs, anxiety, osa , back pain, morbid obesity, GERD, hearing deficit, tobacco smoking obesity hypoventilation, asthma, hypothyroidism   PAST SURGICAL HISTORY:  C section, Cholecystectomy, Hysterectomy,   REVIEW OF SYSTEMS:  Pt is lethargic.    SPIRITUAL SUPPORT SYSTEM: Yes.  SOCIAL HISTORY:  reports that she has quit smoking. She does not have any smokeless tobacco history on file.  Home has wood heat.  Pt has a husband who helps take care of her, but pts two daughters have been decision makers and have informed staff that he won't be coming here. There is a son who has come to visit as well (I spoke with him today).  ONe daughter is reportedly adopted.  Marita [place] and Edmonia Lynch  are the daughters.    LEGAL DOCUMENTS:  none  CODE STATUS: Full code  PAST MEDICAL HISTORY:  PAST SURGICAL HISTORY:   ALLERGIES:  is allergic to codeine and sulfa antibiotics.  MEDICATIONS:  Current Facility-Administered Medications  Medication Dose Route Frequency Provider Last Rate Last Dose  . 0.9 %  sodium chloride infusion  250 mL Intravenous PRN Wilhelmina Mcardle, MD      . albuterol (PROVENTIL) (2.5 MG/3ML) 0.083% nebulizer solution 2.5 mg  2.5 mg Nebulization Q2H PRN Wilhelmina Mcardle, MD   2.5 mg at 04/12/16 2059  . budesonide (PULMICORT) nebulizer solution 0.25 mg  0.25 mg Nebulization 4 times per day Wilhelmina Mcardle, MD   0.25 mg at 04/14/16 0803  . dexmedetomidine (PRECEDEX) 400 MCG/100ML (4 mcg/mL) infusion  0-0.7 mcg/kg/hr Intravenous Titrated Wilhelmina Mcardle, MD 14.8 mL/hr at 04/14/16 0635 0.4 mcg/kg/hr at 04/14/16 9169  . fentaNYL (SUBLIMAZE) injection 12.5-25 mcg  12.5-25 mcg Intravenous Q2H PRN Wilhelmina Mcardle, MD   25 mcg at 04/13/16 2350  . heparin injection 5,000 Units  5,000 Units Subcutaneous Q8H Flora Lipps, MD   5,000 Units at 04/14/16 0519  . insulin aspart (novoLOG) injection 0-9 Units  0-9 Units Subcutaneous Q4H Wilhelmina Mcardle, MD   0 Units at 04/10/16 1705  . ipratropium-albuterol (DUONEB) 0.5-2.5 (3) MG/3ML nebulizer solution 3 mL  3 mL Nebulization Q6H Wilhelmina Mcardle, MD   3 mL at 04/14/16 0803  . levothyroxine (SYNTHROID, LEVOTHROID) injection 37.5 mcg  37.5 mcg Intravenous Daily Wilhelmina Mcardle, MD   37.5 mcg at 04/13/16 0920  . ondansetron (ZOFRAN) injection 4 mg  4 mg Intravenous Q6H PRN Wilhelmina Mcardle, MD      .  pantoprazole (PROTONIX) injection 40 mg  40 mg Intravenous Q24H Wilhelmina Mcardle, MD   40 mg at 04/13/16 1459  . sodium chloride flush (NS) 0.9 % injection 10-40 mL  10-40 mL Intracatheter PRN Murlean Iba, MD        Vital Signs: BP 121/43 mmHg  Pulse 57  Temp(Src) 98.7 F (37.1 C) (Axillary)  Resp 20  Ht 5' 5" (1.651 m)  Wt 169.645 kg  (374 lb)  BMI 62.24 kg/m2  SpO2 96% Filed Weights   04/12/16 0411 04/13/16 0356 04/14/16 0402  Weight: 180.985 kg (399 lb) 180.9 kg (398 lb 13 oz) 169.645 kg (374 lb)    Estimated body mass index is 62.24 kg/(m^2) as calculated from the following:   Height as of this encounter: 5' 5" (1.651 m).   Weight as of this encounter: 169.645 kg (374 lb).  PERFORMANCE STATUS (ECOG) : 4 - Bedbound  PHYSICAL EXAM: Lying in Step-Down bed (room 20) on large bed with BIPAP in place Lethargic at this time Eyes closed Neck w/o JVD or TM Hrt rrr no m --distant HS Lungs with scant air movement upper zones Abd obese nt Ext edematous    LABS: CBC:    Component Value Date/Time   WBC 6.5 04/12/2016 0446   WBC 31.5* 03/30/2013 0416   HGB 7.2* 04/12/2016 0446   HGB 9.0* 03/30/2013 0416   HCT 23.4* 04/12/2016 0446   HCT 28.1* 03/30/2013 0416   PLT 238 04/12/2016 0446   PLT 329 03/30/2013 0416   MCV 75.1* 04/12/2016 0446   MCV 77* 03/30/2013 0416   NEUTROABS 5.7 04/05/2016 0416   NEUTROABS 29.2* 03/30/2013 0416   LYMPHSABS 1.6 04/05/2016 0416   LYMPHSABS 1.5 03/30/2013 0416   MONOABS 0.8 04/05/2016 0416   MONOABS 0.6 03/30/2013 0416   EOSABS 0.3 04/05/2016 0416   EOSABS 0.1 03/30/2013 0416   BASOSABS 0.1 04/05/2016 0416   BASOSABS 0.0 03/30/2013 0416   Comprehensive Metabolic Panel:    Component Value Date/Time   NA 145 04/13/2016 0918   NA 138 03/30/2013 0416   K 4.1 04/13/2016 0918   K 4.2 03/30/2013 0416   CL 107 04/13/2016 0918   CL 103 03/30/2013 0416   CO2 24 04/13/2016 0918   CO2 21 03/30/2013 0416   BUN 79* 04/13/2016 0918   BUN 58* 03/30/2013 0416   CREATININE 3.77* 04/13/2016 0918   CREATININE 2.27* 03/30/2013 0416   GLUCOSE 89 04/13/2016 0918   GLUCOSE 159* 03/30/2013 0416   CALCIUM 8.6* 04/13/2016 0918   CALCIUM 7.7* 03/30/2013 0416   AST 19 04/10/2016 0426   AST 18 03/28/2013 1513   ALT 12* 04/10/2016 0426   ALT 9* 03/28/2013 1513   ALKPHOS 34* 04/10/2016  0426   ALKPHOS 44* 03/28/2013 1513   BILITOT 0.4 04/10/2016 0426   BILITOT 0.6 03/28/2013 1513   PROT 6.5 04/10/2016 0426   PROT 7.7 03/28/2013 1513   ALBUMIN 2.2* 04/13/2016 0918   ALBUMIN 2.2* 03/28/2013 1513   Echo 03/25/16: - Left ventricle: The cavity size was moderately dilated. Systolic  function was mildly to moderately reduced. The estimated ejection  fraction was 45%. Diffuse hypokinesis. Doppler parameters are  consistent with abnormal left ventricular relaxation (grade 1  diastolic dysfunction). - Aortic valve: There was trivial regurgitation. Valve area (Vmax):  2.33 cm^2. - Left atrium: The atrium was mildly dilated. - Right ventricle: The cavity size was moderately dilated. - Right atrium: The atrium was mildly dilated. - Atrial septum: No defect  or patent foramen ovale was identified. - Pericardium, extracardiac: A trivial pericardial effusion was  identified. - Four-chamber dilated patient, with moderate left ventricle  systolic dysfunction and mild diastolic dysfunction with diffuse  hypokinesis.  CXR 04/12/16 1. Right jugular approach dual lumen PICC line tip projects over the superior cavoatrial junction. No supine evidence of pneumothorax. 2. Interval extubation and removal of enteric tube. 3. Findings suggestive of worsening pulmonary edema.    More than 50% of the visit was spent in counseling/coordination of care: Yes  Time Spent: 80 minutes

## 2016-04-14 NOTE — Progress Notes (Signed)
Critical Troponin 0.04, Dr. Mortimer Fries notified. Pt no longer complaining of chest pain.

## 2016-04-14 NOTE — Progress Notes (Signed)
Chaplain rounded the unit and provided a compassionate presence and support to the patient and family.  Tasha Long [phone]

## 2016-04-14 NOTE — Progress Notes (Signed)
[facility] Encounter Note  Patient: Tasha Long / Admit Date: 03/25/2016 / Date of Encounter: 04/14/2016, 8:31 AM   Subjective: Patient with less respiratory distress than admission. Patient hemodynamically stable. Patient has BiPAP machine  Review of Systems: Positive for: Shortness of breath Negative for: Vision change, hearing change, syncope, dizziness, nausea, vomiting,diarrhea, bloody stool, stomach pain, cough, congestion, diaphoresis, urinary frequency, urinary pain,skin lesions, skin rashes Others previously listed  Objective: Telemetry: Normal sinus rhythm Physical Exam: Blood pressure 129/48, pulse 62, temperature 99 F (37.2 C), temperature source Axillary, resp. rate 21, height 5\' 5"  (1.651 m), weight 374 lb (169.645 kg), SpO2 95 %. Body mass index is 62.24 kg/(m^2). General: Well developed, well nourished, in no acute distress. Head: Normocephalic, atraumatic, sclera non-icteric, no xanthomas, nares are without discharge. Neck: No apparent masses Lungs: Normal respirations with diffuse wheezes, no rhonchi, no rales , basilar crackles   Heart: Regular rate and rhythm, normal S1 S2, no murmur, no rub, no gallop, PMI is normal size and placement, carotid upstroke normal without bruit, jugular venous pressure normal Abdomen: Soft, non-tender,  distended with normoactive bowel sounds. No hepatosplenomegaly. Abdominal aorta is normal size without bruit Extremities: 1+ edema, no clubbing, no cyanosis, positive ulcers,  Peripheral: 2+ radial, 0 + femoral, 1 + dorsal pedal pulses Neuro: Alert and oriented. Moves all extremities spontaneously. Psych:  Responds to questions appropriately with a normal affect.   Intake/Output Summary (Last 24 hours) at 04/14/16 0831 Last data filed at 04/14/16 0500  Gross per 24 hour  Intake 125.95 ml  Output   2400 ml  Net -2274.05 ml    Inpatient Medications:  . budesonide (PULMICORT) nebulizer solution   0.25 mg Nebulization 4 times per day  . heparin subcutaneous  5,000 Units Subcutaneous Q8H  . insulin aspart  0-9 Units Subcutaneous Q4H  . ipratropium-albuterol  3 mL Nebulization Q6H  . levothyroxine  37.5 mcg Intravenous Daily  . pantoprazole (PROTONIX) IV  40 mg Intravenous Q24H   Infusions:  . dexmedetomidine 0.4 mcg/kg/hr (04/14/16 0635)    Labs:  Recent Labs  04/12/16 0446 04/13/16 0918  NA 143 145  K 4.0 4.1  CL 105 107  CO2 30 24  GLUCOSE 89 89  BUN 77* 79*  CREATININE 3.48* 3.77*  CALCIUM 9.0 8.6*  PHOS 5.8* 5.9*    Recent Labs  04/12/16 0446 04/13/16 0918  ALBUMIN 2.2* 2.2*    Recent Labs  04/12/16 0446  WBC 6.5  HGB 7.2*  HCT 23.4*  MCV 75.1*  PLT 238   No results for input(s): CKTOTAL, CKMB, TROPONINI in the last 72 hours. Invalid input(s): POCBNP No results for input(s): HGBA1C in the last 72 hours.   Weights: Filed Weights   04/12/16 0411 04/13/16 0356 04/14/16 0402  Weight: 399 lb (180.985 kg) 398 lb 13 oz (180.9 kg) 374 lb (169.645 kg)     Radiology/Studies:  Ct Abdomen Pelvis Wo Contrast  03/26/2016  CLINICAL DATA:  60 year old female with history of abdominal pain. Respiratory arrest and brief cardiac arrest. EXAM: CT CHEST, ABDOMEN AND PELVIS WITHOUT CONTRAST TECHNIQUE: Multidetector CT imaging of the chest, abdomen and pelvis was performed following the standard protocol without IV contrast. COMPARISON:  No priors. FINDINGS: CT CHEST FINDINGS Mediastinum/Lymph Nodes: Heart size is mildly enlarged. There is no significant pericardial fluid, thickening or pericardial calcification. Mild dilatation of the pulmonic trunk (3.5 cm in diameter). Multiple borderline enlarged and mildly enlarged mediastinal and hilar lymph nodes measuring up to 12  mm in short axis in the low right paratracheal nodal station. Small hiatal hernia. Near complete collapse of the distal trachea and main bronchi on expiratory phase imaging, indicative of severe  tracheobronchomalacia. No axillary lymphadenopathy. Lungs/Pleura: Diffuse bronchial wall thickening with diffuse centrilobular ground-glass attenuation micro and macronodularity throughout all aspects of the lungs bilaterally, forming into near confluent opacities in the right lower lobe, most compatible with severe multilobar bronchopneumonia. No pleural effusions. Musculoskeletal/Soft Tissues: There are no aggressive appearing lytic or blastic lesions noted in the visualized portions of the skeleton. CT ABDOMEN AND PELVIS FINDINGS Hepatobiliary: Small calcified granuloma in the right lobe of the liver. No definite cystic or solid hepatic lesions are identified within the liver on today's noncontrast CT examination. Gallbladder is not visualized, presumably surgically absent (no surgical clips are noted in the gallbladder fossa). Pancreas: No definite pancreatic mass or peripancreatic inflammatory changes on today's noncontrast CT examination. Spleen: Small calcified granulomas in the spleen. Adrenals/Urinary Tract: Unenhanced appearance of the adrenal glands and kidneys bilaterally is normal. No hydroureteronephrosis. Urinary bladder is nearly completely decompressed around an indwelling Foley catheter. Stomach/Bowel: Unenhanced appearance of the stomach is normal. There is no pathologic dilatation of small bowel or colon. The appendix is not confidently identified may be surgically absent. Regardless, there are no inflammatory changes noted adjacent to the cecum to suggest the presence of an acute appendicitis at this time. Vascular/Lymphatic: Mild atherosclerotic calcifications throughout the abdominal and pelvic vasculature, without definite aneurysm. No lymphadenopathy noted in the abdomen or pelvis. Reproductive: Status post hysterectomy. Ovaries are not confidently identified may be surgically absent or atrophic. Other: Trace volume of ascites.  No pneumoperitoneum. Musculoskeletal: There are no aggressive  appearing lytic or blastic lesions noted in the visualized portions of the skeleton. IMPRESSION: 1. Severe multilobar bronchopneumonia throughout the lungs bilaterally. 2. Multiple borderline enlarged and minimally enlarged mediastinal and bilateral hilar lymph nodes, presumably reactive. 3. Mild dilatation of the pulmonic trunk (3.5 cm in diameter), suggestive of pulmonary arterial hypertension. 4. Mild cardiomegaly. 5. Atherosclerosis. 6. Additional incidental findings, as above. Electronically Signed   By: Vinnie Langton M.D.   On: 03/26/2016 14:56   Dg Chest 1 View  04/09/2016  CLINICAL DATA:  CCU PT, Dyspnea EXAM: CHEST 1 VIEW COMPARISON:  04/08/2016 FINDINGS: Stable cardiac enlargement and support devices. Mild diffuse interstitial prominence. No definite pulmonary edema. No consolidation or effusion. IMPRESSION: No significant change. Electronically Signed   By: Skipper Cliche M.D.   On: 04/09/2016 08:13   Dg Chest 1 View  04/08/2016  CLINICAL DATA:  Dyspnea EXAM: CHEST 1 VIEW COMPARISON:  04/07/2016 FINDINGS: Cardiac shadow is stable. A an endotracheal tube, nasogastric catheter, right subclavian central line and left jugular dialysis catheter are again noted and stable. Improved aeration left base is noted. No focal infiltrate is seen. No pneumothorax is noted. IMPRESSION: Tubes and lines as described. No acute abnormality is seen. Electronically Signed   By: Inez Catalina M.D.   On: 04/08/2016 07:11   Dg Chest 1 View  04/07/2016  CLINICAL DATA:  Shortness of breath. EXAM: CHEST 1 VIEW COMPARISON:  04/06/2017. FINDINGS: Endotracheal tube, right subclavian, left IJ line, NG tube. Cardiomegaly with increasing interstitial prominence suggesting congestive heart failure. Small left pleural effusion cannot be excluded. No pneumothorax. IMPRESSION: 1.  Lines and tubes in stable position. 2. Congestive heart failure with slight increase in interstitial edema. Small left pleural effusion . Electronically  Signed   By: Marcello Moores  Register   On: 04/07/2016 07:00  Dg Chest 1 View  04/06/2016  CLINICAL DATA:  Cardiopulmonary arrest with resuscitation, sepsis, community-acquired pneumonia. EXAM: CHEST 1 VIEW COMPARISON:  Portable chest x-ray of April 05, 2016 FINDINGS: The lungs remain well-expanded. The interstitial markings of both lungs are slightly more conspicuous overall today. The cardiac silhouette is enlarged. The central pulmonary vascularity is engorged. There is no alveolar edema all, pleural effusion, or pneumothorax. The endotracheal tube tip lies 3 cm above the carina. The esophagogastric tube tip projects below the inferior margin of the image. The left internal jugular venous catheter tip projects over the cavoatrial junction. The right subclavian venous catheter tip projects over the proximal SVC. IMPRESSION: Slight interval increase in pulmonary interstitial edema. Persistent cardiomegaly and central pulmonary vascular congestion. No pneumonia nor pulmonary edema is observed. Electronically Signed   By: David  Martinique M.D.   On: 04/06/2016 07:56   Dg Chest 1 View  04/05/2016  CLINICAL DATA:  Respiratory failure, cardiopulmonary arrest, community-acquired pneumonia, intubated patient, former smoker. EXAM: CHEST 1 VIEW COMPARISON:  Chest x-ray of April 03, 2016 FINDINGS: The lungs are well-expanded. The interstitial markings remain increased but have improved since the previous study. There is no pneumothorax or pleural effusion. The cardiac silhouette remains enlarged. The pulmonary vascularity is prominent centrally but the congestion has decreased. The endotracheal tube tip lies approximately 3.8 cm above the carina. The esophagogastric tube tip projects below the inferior margin of the image. The dual-lumen left internal jugular catheter tip projects over the cavoatrial junction. The right subclavian venous catheter tip projects over the proximal SVC. IMPRESSION: Improved appearance of the  pulmonary interstitium suggesting resolving interstitial edema. The support tubes are in stable position. Electronically Signed   By: David  Martinique M.D.   On: 04/05/2016 07:22   Dg Chest 1 View  04/03/2016  CLINICAL DATA:  Cardiopulmonary arrest.  Respiratory failure EXAM: CHEST 1 VIEW FINDINGS: Endo trachea tube, feeding tube, LEFT RIGHT central venous line unchanged. Stable cardiac silhouette. No effusion no pneumothorax. Mild central venous congestion. No pulmonary edema. IMPRESSION: Stable support apparatus. Central venous congestion.  No interval change. Electronically Signed   By: Suzy Bouchard M.D.   On: 04/03/2016 11:44   Dg Chest 1 View  03/28/2016  CLINICAL DATA:  OG tube placement EXAM: CHEST 1 VIEW COMPARISON:  Chest radiograph dated 03/28/2016 at 1626 hours FINDINGS: Endotracheal tube terminates 3 cm above the carina. Increased interstitial markings, corresponding to known tree-in-bud nodularity/multifocal pneumonia, better evaluated on CT. No definite pleural effusions or pneumothorax. Right subclavian venous catheter terminates in the upper SVC. Left IJ venous catheter terminates in the low right atrium. Enteric tube courses below the diaphragm. IMPRESSION: Endotracheal tube terminates 3 cm above the carina. Enteric tube courses below the diaphragm. Additional stable support apparatus as above. Electronically Signed   By: Julian Hy M.D.   On: 03/28/2016 19:07   Dg Chest 1 View  03/28/2016  CLINICAL DATA:  Status post central line placement EXAM: CHEST 1 VIEW COMPARISON:  03/28/2016 FINDINGS: Endotracheal tube and nasogastric catheter are again noted and stable. Stable right subclavian central line is noted in the proximal superior vena cava. A new left jugular catheter is noted extending deep into the right atrium. This could be withdrawn 2-3 cm. No pneumothorax is noted. Persistent infiltrative changes are noted in both lungs. Cardiac shadow is stable. IMPRESSION: New left jugular  catheter extending deep into the right atrium. This could be withdrawn 2-3 cm. No pneumothorax is noted. The remainder of the exam  is stable. Electronically Signed   By: Inez Catalina M.D.   On: 03/28/2016 16:44   Dg Chest 1 View  03/28/2016  CLINICAL DATA:  60 year old female with dyspnea. Subsequent encounter. EXAM: CHEST 1 VIEW COMPARISON:  03/27/2016. FINDINGS: Endotracheal tube with tip 2.8 cm above the carina. Right central line tip proximal superior vena cava level. Severe multi lobular bronchopneumonia throughout both lungs noted on recent CT not as well delineated on the present plain film exam. Pulmonary vascular congestion. Follow-up until clearance recommended to exclude underlying malignancy. Cardiomegaly. IMPRESSION: Severe multi lobular bronchopneumonia throughout both lungs noted on recent CT not as well delineated on the present plain film exam. Pulmonary vascular congestion. Electronically Signed   By: Genia Del M.D.   On: 03/28/2016 07:50   Dg Chest 1 View  03/27/2016  CLINICAL DATA:  60 year old female with a history of respiratory failure. EXAM: CHEST 1 VIEW COMPARISON:  CT 03/26/2016, chest x-ray 03/26/2016, 03/25/2016 FINDINGS: Cardiomediastinal silhouette unchanged. Multifocal bilateral airspace disease, better characterized on prior CT. No large pleural effusion.  No visualized pneumothorax. Endotracheal tube remains in position, terminating approximately 3.5 cm above the carina. Right subclavian central venous catheter appearing to terminate in the superior vena cava. Overlying EKG leads. Interval removal of the defibrillator pads Gastric tube projects over the mediastinum and terminates out of the field of view. IMPRESSION: Similar appearance of multifocal pneumonia with no large pleural effusion. Interval removal of defibrillator pads, with otherwise unchanged support apparatus. Signed, Dulcy Fanny. Earleen Newport, DO [facility]  Electronically Signed   By: Corrie Mckusick D.O.   On: 03/27/2016 08:23   Dg Chest 1 View  03/26/2016  CLINICAL DATA:  Dyspnea. EXAM: CHEST 1 VIEW COMPARISON:  03/25/2016. FINDINGS: Endotracheal tube in satisfactory position. Right jugular catheter tip in the proximal superior vena cava. The nasogastric tube is poorly visualized inferiorly. Stable enlarged cardiac silhouette and prominent interstitial markings. There is a persistent rounded density the in the inferior perihilar region on the right, measuring approximately 4.3 cm in maximum diameter. IMPRESSION: 1. Stable mass or rounded area of atelectasis or pneumonia in the right inferior perihilar region. 2. Stable cardiomegaly and chronic interstitial lung disease. Electronically Signed   By: Claudie Revering M.D.   On: 03/26/2016 07:37   Dg Chest 1 View  03/25/2016  CLINICAL DATA:  Central line placement. EXAM: CHEST 1 VIEW COMPARISON:  03/25/2016.  05/15/2013 . FINDINGS: Endotracheal tube tip 4.3 cm above the carina. NG tube tip below left hemidiaphragm. Right central line noted with tip projected superior vena cava. Right perihilar rounded infiltrate. Pneumonia could present this fashion. Mass lesion cannot be excluded. Cardiomegaly with mild pulmonary venous interstitial prominence. Mild congestive heart failure cannot be excluded. Close follow-up chest x-rays are suggested demonstrate clearing of these findings. Degenerative changes thoracic spine. IMPRESSION: 1. Endotracheal tube 4.3 cm above the carina. NG tube tip below left hemidiaphragm. Right central line noted with tip projected superior vena cava. No complicating features. No pneumothorax. 2. Right perihilar infiltrate versus mass lesion. Close follow-up chest x-rays recommended to demonstrate complete clearing. Further evaluation with chest CT can be obtained as needed . 3. Cardiomegaly with mild pulmonary vascular prominence and interstitial prominence. A mild component congestive heart failure  cannot be excluded . Electronically Signed   By: Marcello Moores  Register   On: 03/25/2016 11:43   Dg Abd 1 View  03/28/2016  CLINICAL DATA:  OG tube placement EXAM: ABDOMEN - 1 VIEW COMPARISON:  CT  abdomen pelvis dated 03/26/2016 FINDINGS: Enteric tube terminates in the distal gastric body. IMPRESSION: Enteric tube terminates in the distal gastric body. Electronically Signed   By: Julian Hy M.D.   On: 03/28/2016 19:09   Dg Abd 1 View  03/26/2016  CLINICAL DATA:  60 year old female with abdominal ileus EXAM: ABDOMEN - 1 VIEW COMPARISON:  Chest x-ray obtained earlier today FINDINGS: Limited study secondary to patient body habitus. A total of 3 radiographs were obtained in an effort to encompass the entirety of the abdomen. The tip of the nasogastric tube overlies the upper stomach just within the gastroesophageal junction. The bowel gas pattern does not appear obstructed. The lung bases are clear. IMPRESSION: 1. The tip of the nasogastric tube projects over the gastroesophageal junction. Consider advancing 5 cm for more optimal placement. 2. Unremarkable bowel gas pattern given limitations of the study related to patient body habitus. Electronically Signed   By: Jacqulynn Cadet M.D.   On: 03/26/2016 09:12   Ct Head Wo Contrast  03/28/2016  CLINICAL DATA:  Unresponsive, acute respiratory failure. EXAM: CT HEAD WITHOUT CONTRAST TECHNIQUE: Contiguous axial images were obtained from the base of the skull through the vertex without intravenous contrast. COMPARISON:  None. FINDINGS: Bony calvarium appears intact. Mild bilateral maxillary and ethmoid sinusitis is noted. Minimal diffuse cortical atrophy is noted. Mild chronic ischemic white matter disease is noted. No mass effect or midline shift is noted. Ventricular size is within normal limits. There is no evidence of mass lesion, hemorrhage or acute infarction. IMPRESSION: Minimal diffuse cortical atrophy. Mild chronic ischemic white matter disease. Mild  bilateral maxillary and ethmoid sinusitis. No acute intracranial abnormality seen. Electronically Signed   By: Marijo Conception, M.D.   On: 03/28/2016 12:52   Ct Chest Wo Contrast  03/26/2016  CLINICAL DATA:  60 year old female with history of abdominal pain. Respiratory arrest and brief cardiac arrest. EXAM: CT CHEST, ABDOMEN AND PELVIS WITHOUT CONTRAST TECHNIQUE: Multidetector CT imaging of the chest, abdomen and pelvis was performed following the standard protocol without IV contrast. COMPARISON:  No priors. FINDINGS: CT CHEST FINDINGS Mediastinum/Lymph Nodes: Heart size is mildly enlarged. There is no significant pericardial fluid, thickening or pericardial calcification. Mild dilatation of the pulmonic trunk (3.5 cm in diameter). Multiple borderline enlarged and mildly enlarged mediastinal and hilar lymph nodes measuring up to 12 mm in short axis in the low right paratracheal nodal station. Small hiatal hernia. Near complete collapse of the distal trachea and main bronchi on expiratory phase imaging, indicative of severe tracheobronchomalacia. No axillary lymphadenopathy. Lungs/Pleura: Diffuse bronchial wall thickening with diffuse centrilobular ground-glass attenuation micro and macronodularity throughout all aspects of the lungs bilaterally, forming into near confluent opacities in the right lower lobe, most compatible with severe multilobar bronchopneumonia. No pleural effusions. Musculoskeletal/Soft Tissues: There are no aggressive appearing lytic or blastic lesions noted in the visualized portions of the skeleton. CT ABDOMEN AND PELVIS FINDINGS Hepatobiliary: Small calcified granuloma in the right lobe of the liver. No definite cystic or solid hepatic lesions are identified within the liver on today's noncontrast CT examination. Gallbladder is not visualized, presumably surgically absent (no surgical clips are noted in the gallbladder fossa). Pancreas: No definite pancreatic mass or peripancreatic  inflammatory changes on today's noncontrast CT examination. Spleen: Small calcified granulomas in the spleen. Adrenals/Urinary Tract: Unenhanced appearance of the adrenal glands and kidneys bilaterally is normal. No hydroureteronephrosis. Urinary bladder is nearly completely decompressed around an indwelling Foley catheter. Stomach/Bowel: Unenhanced appearance of the stomach is normal.  There is no pathologic dilatation of small bowel or colon. The appendix is not confidently identified may be surgically absent. Regardless, there are no inflammatory changes noted adjacent to the cecum to suggest the presence of an acute appendicitis at this time. Vascular/Lymphatic: Mild atherosclerotic calcifications throughout the abdominal and pelvic vasculature, without definite aneurysm. No lymphadenopathy noted in the abdomen or pelvis. Reproductive: Status post hysterectomy. Ovaries are not confidently identified may be surgically absent or atrophic. Other: Trace volume of ascites.  No pneumoperitoneum. Musculoskeletal: There are no aggressive appearing lytic or blastic lesions noted in the visualized portions of the skeleton. IMPRESSION: 1. Severe multilobar bronchopneumonia throughout the lungs bilaterally. 2. Multiple borderline enlarged and minimally enlarged mediastinal and bilateral hilar lymph nodes, presumably reactive. 3. Mild dilatation of the pulmonic trunk (3.5 cm in diameter), suggestive of pulmonary arterial hypertension. 4. Mild cardiomegaly. 5. Atherosclerosis. 6. Additional incidental findings, as above. Electronically Signed   By: Vinnie Langton M.D.   On: 03/26/2016 14:56   US Abdomen Complete  03/25/2016  CLINICAL DATA:  Abdominal pain EXAM: ABDOMEN ULTRASOUND COMPLETE COMPARISON:  None. FINDINGS: Gallbladder: Surgically absent Common bile duct: Diameter: 3 mm. Where visualized, no filling defect. Liver: No focal lesion identified. Within normal limits in parenchymal echogenicity. IVC: No abnormality  visualized. Pancreas: Visualized portion unremarkable. Spleen: Size and appearance within normal limits. Right Kidney: Length: 10 cm. Echogenicity within normal limits. No mass or hydronephrosis visualized. Left Kidney: Length: 11 cm. Echogenicity within normal limits. No mass or hydronephrosis visualized. Abdominal aorta: Normal diameter proximal aorta at 22 mm. The mid and distal aorta was obscured by bowel gas. Other findings: None. IMPRESSION: 1. No explanation for abdominal pain. 2. Bowel gas obscures the lower abdominal aorta. 3. Cholecystectomy. Electronically Signed   By: Monte Fantasia M.D.   On: 03/25/2016 08:25   US Venous Img Lower Bilateral  03/25/2016  CLINICAL DATA:  Bilateral lower extremity edema, morbid obesity EXAM: BILATERAL LOWER EXTREMITY VENOUS DOPPLER ULTRASOUND TECHNIQUE: Gray-scale sonography with graded compression, as well as color Doppler and duplex ultrasound were performed to evaluate the lower extremity deep venous systems from the level of the common femoral vein and including the common femoral, femoral, profunda femoral, popliteal and calf veins including the posterior tibial, peroneal and gastrocnemius veins when visible. The superficial great saphenous vein was also interrogated. Spectral Doppler was utilized to evaluate flow at rest and with distal augmentation maneuvers in the common femoral, femoral and popliteal veins. COMPARISON:  None. FINDINGS: Limited exam because of body habitus and condition. RIGHT LOWER EXTREMITY Common Femoral Vein: No evidence of thrombus. Normal compressibility, respiratory phasicity and response to augmentation. Saphenofemoral Junction: No evidence of thrombus. Normal compressibility and flow on color Doppler imaging. Profunda Femoral Vein: No evidence of thrombus. Normal compressibility and flow on color Doppler imaging. Femoral Vein: No evidence of thrombus. Normal compressibility, respiratory phasicity and response to augmentation. Popliteal  Vein: No evidence of thrombus. Normal compressibility, respiratory phasicity and response to augmentation. Calf Veins: Very limited assessment, difficult to exclude small calf thrombus. Superficial Great Saphenous Vein: No evidence of thrombus. Normal compressibility and flow on color Doppler imaging. Venous Reflux:  None. Other Findings:  Peripheral subcutaneous edema noted. LEFT LOWER EXTREMITY Common Femoral Vein: No evidence of thrombus. Normal compressibility, respiratory phasicity and response to augmentation. Saphenofemoral Junction: No evidence of thrombus. Normal compressibility and flow on color Doppler imaging. Profunda Femoral Vein: No evidence of thrombus. Normal compressibility and flow on color Doppler imaging. Femoral Vein: No evidence of  thrombus. Normal compressibility, respiratory phasicity and response to augmentation. Popliteal Vein: No evidence of thrombus. Normal compressibility, respiratory phasicity and response to augmentation. Calf Veins: Very limited assessment, it difficult to exclude small calf thrombus. Superficial Great Saphenous Vein: No evidence of thrombus. Normal compressibility and flow on color Doppler imaging. Venous Reflux:  None. Other Findings:  Peripheral edema noted. IMPRESSION: Limited exam because of body habitus. No significant occlusive femoral popliteal DVT demonstrated in either extremity. Limited assessment of the calf veins. Electronically Signed   By: Jerilynn Mages.  Shick M.D.   On: 03/25/2016 16:16   Dg Chest Port 1 View  04/12/2016  CLINICAL DATA:  Post PICC line placement EXAM: PORTABLE CHEST 1 VIEW COMPARISON:  04/10/2016 FINDINGS: Interval placement of right jugular approach dual lumen PICC line with tip projected over at least the superior cavoatrial junction. Interval extubation and removal of enteric tube. Otherwise, stable positioning remaining support apparatus including tip of right subclavian vein approach central venous catheter projected over the superior  SVC. No supine evidence of pneumothorax. Grossly unchanged enlarged cardiac silhouette and mediastinal contours with atherosclerotic plaque within the thoracic aorta. Pulmonary vasculature appears less distinct than present examination with cephalization of flow. Veiling opacities overlying the bilateral lower lungs are favored to represent overlying breast tissues. No focal airspace opacities. No supine evidence of pleural effusion. No pneumothorax. IMPRESSION: 1. Right jugular approach dual lumen PICC line tip projects over the superior cavoatrial junction. No supine evidence of pneumothorax. 2. Interval extubation and removal of enteric tube. 3. Findings suggestive of worsening pulmonary edema. Electronically Signed   By: Sandi Mariscal M.D.   On: 04/12/2016 21:44   Dg Chest Port 1 View  04/10/2016  CLINICAL DATA:  Patient is on a bipap machine and is being seen for respiratory failure. Hx obesity, former smoker EXAM: PORTABLE CHEST 1 VIEW COMPARISON:  04/09/2016 FINDINGS: Support devices unchanged. Stable mild cardiac silhouette enlargement and mild vascular congestion. Mild diffuse interstitial prominence without definite pulmonary edema. No consolidation. Inferior costophrenic angles excluded from the image. IMPRESSION: No significant change from prior radiograph. Electronically Signed   By: Skipper Cliche M.D.   On: 04/10/2016 07:30   Dg Chest Port 1 View  04/02/2016  CLINICAL DATA:  Acute respiratory failure. EXAM: PORTABLE CHEST 1 VIEW COMPARISON:  04/01/2016 FINDINGS: Lungs mildly hyperexpanded. There are prominent interstitial markings, stable from previous day's exam. No convincing pneumonia or pulmonary edema. No pleural effusion or pneumothorax. Cardiac silhouette is normal in size. Endotracheal tube tip projects 2 cm above the Carina, well positioned. Oral/nasogastric tube passes below the diaphragm into the stomach. Left internal jugular dual-lumen tunneled central venous catheter tip projects in  the right atrium. Right subclavian central venous line catheter tip projects in the mid superior vena cava. No change in support apparatus. IMPRESSION: 1. Prominent interstitial markings at lung hyperexpansion. Overall, lung aeration has continuing improved since studies dated 03/25/2016. No new lung abnormalities. 2. Support apparatus is stable as described. Electronically Signed   By: Lajean Manes M.D.   On: 04/02/2016 07:51   Dg Chest Port 1 View  04/01/2016  CLINICAL DATA:  Respiratory arrest, cardiopulmonary arrest with resuscitation, morbid obesity, asthma, former smoker. EXAM: PORTABLE CHEST 1 VIEW COMPARISON:  Portable chest x-ray of March 28, 2016 FINDINGS: The lungs are well-expanded. The hemidiaphragms are excluded from the field of view however. The heart is normal in size. The central pulmonary vascularity is engorged. The pulmonary interstitial markings remain increased. The endotracheal tube tip lies 2.7 cm  above the carina. The esophagogastric tube tip projects below the inferior margin of the image. The dialysis catheter tip projects below the inferior margin of the image is well. The right subclavian venous catheter tip projects over the proximal SVC. IMPRESSION: Limited study with exclusion of the hemidiaphragms. There is persistent pulmonary interstitial edema and mild cardiomegaly. There is no pneumothorax. The support tubes are in reasonable position where visualized. Electronically Signed   By: David  Martinique M.D.   On: 04/01/2016 10:13   Dg Chest Portable 1 View  03/25/2016  CLINICAL DATA:  Shortness of breath EXAM: PORTABLE CHEST 1 VIEW COMPARISON:  05/15/2013 FINDINGS: Endotracheal tube tip between the clavicular heads and carina. Diffuse coarse reticular nodular opacities. Borderline cardiomegaly, accentuated by technique. Negative aortic and hilar contours for technique. No effusion or pneumothorax. No Kerley lines. IMPRESSION: 1. Unremarkable endotracheal tube positioning. 2.  Diffuse interstitial opacity, favor atypical infection or aspiration over CHF. 3. Probable background COPD. Electronically Signed   By: Monte Fantasia M.D.   On: 03/25/2016 07:56     Assessment and Recommendation  60 y.o. female with chronic obstructive pulmonary disease sleep apnea essential hypertension chronic kidney disease stage III possible lower extremity cellulitis with respiratory failure requiring intubation and oxygenation causing acute diastolic dysfunction congestive heart failure and pulmonary edema as well as demand ischemia with elevated troponin now improving with oxygenation 1. No additional medication management or diuresis with diuretics due to improvements with oxygenation only and concerns of worsening chronic kidney disease 2. No further intervention of elevated troponin most consistent with demand ischemia and hypoxia 3. Lower ejection fraction likely to improve with oxygenation after respiratory arrest with current ejection fraction of 40% 4. No further cardiac intervention or diagnostics necessary at this time 5. Begin ambulation if possible and follow for further need for additional medication management including beta blocker for above with cardiac rehabilitation as able  Signed, Serafina Royals M.D. FACC

## 2016-04-14 NOTE — Progress Notes (Signed)
Nutrition Follow-up  DOCUMENTATION CODES:   Morbid obesity  INTERVENTION:  -Pt with inadequate nutritional intake since extubation on 4/30; currently on BiPap and unable to safely take po. If pt continues with inadequate po intake, recommend considering nutrition support   NUTRITION DIAGNOSIS:   Inadequate oral intake related to acute illness as evidenced by NPO status.  Continues  GOAL:   Patient will meet greater than or equal to 90% of their needs  MONITOR:   Diet advancement, Labs, I & O's, Weight trends  REASON FOR ASSESSMENT:   Ventilator    ASSESSMENT:    Pt extubated 04/10/16, currently on BiPap, pt continues to require HD as needed, SLP following but pt currently unable to safely take po. Poor prognosis per MD notes  Diet Order:  DIET - DYS 1 Room service appropriate?: Yes with Assist; Fluid consistency:: Nectar Thick   Energy Intake: recorded po intake 5% at breakfast yesterday; per Clyde Canterbury RN, pt at yogurt yesterday at lunch and magic cup at dinner. This AM pt unable to safely remove BiPap   Skin:  Reviewed, no issues  Last BM:  04/14/16   Labs: reviewed  Meds: ss novolog, precedex  Height:   Ht Readings from Last 1 Encounters:  04/08/16 5\' 5"  (1.651 m)    Weight:   Wt Readings from Last 1 Encounters:  04/14/16 374 lb (169.645 kg)    Ideal Body Weight:  56.8 kg  BMI:  Body mass index is 62.24 kg/(m^2).  Estimated Nutritional Needs:   Kcal:  [id number] kcals  Protein:  143 g (2.5 g/kg)   Fluid:  >1.7 L  EDUCATION NEEDS:   No education needs identified at this time  [place], [place], [place] [phone] Pager  [phone] Weekend/On-Call Pager

## 2016-04-14 NOTE — Progress Notes (Signed)
Brief Nutrition Noted:   Received Consult for TPN, discussed with MD Kasa. Pt is not the best candidate for TPN but no other good option for nutrition at this time. Pt currently on continuous BiPap. Pt with PICC line with dedicated port for TPN available per [facility] RN.    UOP 450 mL in previous 24 hours, only 85 mL UOP documented thus far today. Pt received HD on 04/13/16 with UF of 2.4 L achieved. Reassessing need for HD daily   Labs: sodium 145, potassium 4.1, phosphorus 5.9, corrected calcium 10.04 (albumin 2.2), Creatinine 3.77, BUN 79  Glucose Profile:  Recent Labs  04/14/16 0333 04/14/16 0732 04/14/16 1155  GLUCAP 91 79 79   Meds: ss novolog  Filed Weights   04/12/16 0411 04/13/16 0356 04/14/16 0402  Weight: 399 lb (180.985 kg) 398 lb 13 oz (180.9 kg) 374 lb (169.645 kg)   Discussed plan for initiation of TPN with Scott (Pharmacist); after reviewing labs and with current renal function, recommend starting 5%AA/15%Dextrose WITHOUT Electrolytes today at rate of 40 ml/hr (960 mL, 48 g of protein, 490 kcals). Recommend strict I/O, daily weights, TPN panel ordered for AM. Will assess tolerance on follow up tomorrow and make further recommendations regarding formula and goal rate at that time.

## 2016-04-14 NOTE — Progress Notes (Signed)
[facility]  ROUNDING NOTE   Subjective:  Patient had hemodialysis yesterday. Ultrafiltration achieved was 2.4 L. Urine output was only 450 mL over the preceding 24 hours.   Objective:  Vital signs in last 24 hours:  Temp:  [98 F (36.7 C)-99 F (37.2 C)] 99 F (37.2 C) (05/04 0500) Pulse Rate:  [55-108] 62 (05/04 0600) Resp:  [19-44] 21 (05/04 0600) BP: (109-188)/(42-148) 129/48 mmHg (05/04 0600) SpO2:  [92 %-100 %] 92 % (05/04 0803) FiO2 (%):  [35 %] 35 % (05/04 0400) Weight:  [169.645 kg (374 lb)] 169.645 kg (374 lb) (05/04 0402)  Weight change: -11.255 kg (-24 lb 13 oz) Filed Weights   04/12/16 0411 04/13/16 0356 04/14/16 0402  Weight: 180.985 kg (399 lb) 180.9 kg (398 lb 13 oz) 169.645 kg (374 lb)    Intake/Output: I/O last 3 completed shifts: In: 385 [P.O.:5; I.V.:380] Out: 2900 [Urine:450; Other:2400; Stool:50]   Intake/Output this shift:     Physical Exam: General: Critically ill appearing  Head: [place]/AT hard of hearing OM moist  Eyes: anicteric  Neck: trachea midline  Lungs:  Coarse breath sounds, normal effort  Heart: Regular rate and rhythm  Abdomen:  Soft, nontender, obese, no bowel sounds  Extremities: + peripheral edema.  Neurologic: Awake, alert, will follow commands  Skin: Erythema bilateral lower extremities  Access: Left IJ vascath 4/17 Dr. Mortimer Fries    Basic Metabolic Panel:  Recent Labs Lab 04/09/16 0307 04/10/16 0426 04/11/16 0427 04/12/16 0446 04/13/16 0918  NA 141 142 141 143 145  K 4.2 4.0 4.0 4.0 4.1  CL 102 103 103 105 107  CO2 27 28 27 30 24   GLUCOSE 125* 126* 108* 89 89  BUN 67* 62* 73* 77* 79*  CREATININE 3.28* 2.76* 3.19* 3.48* 3.77*  CALCIUM 8.8* 8.8* 8.9 9.0 8.6*  PHOS  --   --   --  5.8* 5.9*    Liver Function Tests:  Recent Labs Lab 04/10/16 0426 04/12/16 0446 04/13/16 0918  AST 19  --   --   ALT 12*  --   --   ALKPHOS 34*  --   --   BILITOT 0.4  --   --   PROT 6.5  --   --   ALBUMIN 2.1* 2.2*  2.2*   No results for input(s): LIPASE, AMYLASE in the last 168 hours. No results for input(s): AMMONIA in the last 168 hours.  CBC:  Recent Labs Lab 04/08/16 0539 04/09/16 0307 04/10/16 0435 04/11/16 0427 04/12/16 0446  WBC 8.0 8.5 7.5 6.6 6.5  HGB 7.2* 7.9* 7.6* 7.3* 7.2*  HCT 22.8* 24.5* 24.6* 23.6* 23.4*  MCV 75.0* 76.5* 77.0* 74.8* 75.1*  PLT 211 225 224 231 238    Cardiac Enzymes: No results for input(s): CKTOTAL, CKMB, CKMBINDEX, TROPONINI in the last 168 hours.  BNP: Invalid input(s): POCBNP  CBG:  Recent Labs Lab 04/13/16 1630 04/13/16 1933 04/13/16 2319 04/14/16 0333 04/14/16 0732  GLUCAP 84 77 74 91 79    Microbiology: Results for orders placed or performed during the hospital encounter of 03/25/16  Culture, blood (routine x 2)     Status: None   Collection Time: 03/25/16  7:19 AM  Result Value Ref Range Status   Specimen Description BLOOD LEFT FOREARM  Final   Special Requests BOTTLES DRAWN AEROBIC AND ANAEROBIC  1CC  Final   Culture NO GROWTH 5 DAYS  Final   Report Status 03/30/2016 FINAL  Final  Culture, blood (routine x 2)  Status: None   Collection Time: 03/25/16  7:19 AM  Result Value Ref Range Status   Specimen Description BLOOD LEFT HAND  Final   Special Requests BOTTLES DRAWN AEROBIC AND ANAEROBIC  1CC  Final   Culture NO GROWTH 5 DAYS  Final   Report Status 03/30/2016 FINAL  Final  MRSA PCR Screening     Status: Abnormal   Collection Time: 03/25/16 12:42 PM  Result Value Ref Range Status   MRSA by PCR POSITIVE (A) NEGATIVE Final    Comment:        The GeneXpert MRSA Assay (FDA approved for NASAL specimens only), is one component of a comprehensive MRSA colonization surveillance program. It is not intended to diagnose MRSA infection nor to guide or monitor treatment for MRSA infections. CRITICAL RESULT CALLED TO, READ BACK BY AND VERIFIED WITH:  AMELIA BERRY AT 1644 03/25/16 SDR   Urine culture     Status: Abnormal    Collection Time: 03/26/16  1:02 AM  Result Value Ref Range Status   Specimen Description URINE, RANDOM  Final   Special Requests NONE  Final   Culture (A)  Final    20,000 COLONIES/mL ESCHERICHIA COLI Results Called to: AMELIA BERRY, RN  AT 1055 ON 03/28/16 BY CTJ. ESBL-EXTENDED SPECTRUM BETA LACTAMASE-THE ORGANISM IS RESISTANT TO PENICILLINS, CEPHALOSPORINS AND AZTREONAM ACCORDING TO CLSI M100-S15 VOL.[place].    Report Status 03/28/2016 FINAL  Final   Organism ID, Bacteria ESCHERICHIA COLI (A)  Final      Susceptibility   Escherichia coli - MIC*    AMPICILLIN >=32 RESISTANT Resistant     CEFAZOLIN >=64 RESISTANT Resistant     CEFTRIAXONE >=64 RESISTANT Resistant     CIPROFLOXACIN >=4 RESISTANT Resistant     GENTAMICIN <=1 SENSITIVE Sensitive     IMIPENEM <=0.25 SENSITIVE Sensitive     NITROFURANTOIN <=16 SENSITIVE Sensitive     TRIMETH/SULFA >=320 RESISTANT Resistant     AMPICILLIN/SULBACTAM 16 INTERMEDIATE Intermediate     PIP/TAZO <=4 SENSITIVE Sensitive     Extended ESBL POSITIVE Resistant     * 20,000 COLONIES/mL ESCHERICHIA COLI  Culture, respiratory (tracheal aspirate)     Status: None   Collection Time: 03/30/16  8:50 AM  Result Value Ref Range Status   Specimen Description TRACHEAL ASPIRATE  Final   Special Requests NONE  Final   Gram Stain FEW WBC SEEN RARE GRAM NEGATIVE RODS   Final   Culture   Final    MODERATE GROWTH METHICILLIN RESISTANT STAPHYLOCOCCUS AUREUS LIGHT GROWTH OCHROBACTRUM ANTHROPI MODERATE GROWTH GRAM NEGATIVE RODS UNABLE TO FURTHER ID BETA LACTAMASE POSITIVE    Report Status 04/05/2016 FINAL  Final   Organism ID, Bacteria METHICILLIN RESISTANT STAPHYLOCOCCUS AUREUS  Final      Susceptibility   Methicillin resistant staphylococcus aureus - MIC*    CIPROFLOXACIN <=0.5 SENSITIVE Sensitive     ERYTHROMYCIN >=8 RESISTANT Resistant     GENTAMICIN <=0.5 SENSITIVE Sensitive     OXACILLIN >=4 RESISTANT Resistant     TETRACYCLINE 2 SENSITIVE  Sensitive     VANCOMYCIN 1 SENSITIVE Sensitive     TRIMETH/SULFA >=320 RESISTANT Resistant     CLINDAMYCIN >=8 RESISTANT Resistant     RIFAMPIN <=0.5 SENSITIVE Sensitive     Inducible Clindamycin NEGATIVE Sensitive     * MODERATE GROWTH METHICILLIN RESISTANT STAPHYLOCOCCUS AUREUS    Coagulation Studies: No results for input(s): LABPROT, INR in the last 72 hours.  Urinalysis: No results for input(s): COLORURINE, LABSPEC, [place],  GLUCOSEU, HGBUR, BILIRUBINUR, KETONESUR, PROTEINUR, UROBILINOGEN, NITRITE, LEUKOCYTESUR in the last 72 hours.  Invalid input(s): APPERANCEUR    Imaging: Dg Chest Port 1 View  04/12/2016  CLINICAL DATA:  Post PICC line placement EXAM: PORTABLE CHEST 1 VIEW COMPARISON:  04/10/2016 FINDINGS: Interval placement of right jugular approach dual lumen PICC line with tip projected over at least the superior cavoatrial junction. Interval extubation and removal of enteric tube. Otherwise, stable positioning remaining support apparatus including tip of right subclavian vein approach central venous catheter projected over the superior SVC. No supine evidence of pneumothorax. Grossly unchanged enlarged cardiac silhouette and mediastinal contours with atherosclerotic plaque within the thoracic aorta. Pulmonary vasculature appears less distinct than present examination with cephalization of flow. Veiling opacities overlying the bilateral lower lungs are favored to represent overlying breast tissues. No focal airspace opacities. No supine evidence of pleural effusion. No pneumothorax. IMPRESSION: 1. Right jugular approach dual lumen PICC line tip projects over the superior cavoatrial junction. No supine evidence of pneumothorax. 2. Interval extubation and removal of enteric tube. 3. Findings suggestive of worsening pulmonary edema. Electronically Signed   By: Sandi Mariscal M.D.   On: 04/12/2016 21:44     Medications:   . dexmedetomidine 0.4 mcg/kg/hr (04/14/16 6659)   . budesonide  (PULMICORT) nebulizer solution  0.25 mg Nebulization 4 times per day  . heparin subcutaneous  5,000 Units Subcutaneous Q8H  . insulin aspart  0-9 Units Subcutaneous Q4H  . ipratropium-albuterol  3 mL Nebulization Q6H  . levothyroxine  37.5 mcg Intravenous Daily  . pantoprazole (PROTONIX) IV  40 mg Intravenous Q24H   sodium chloride, albuterol, fentaNYL (SUBLIMAZE) injection, ondansetron (ZOFRAN) IV, sodium chloride flush  Assessment/ Plan:  Ms. Tasha Long is a 60 y.o. 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   1. Acute renal failure on chronic kidney disease stage III with baseline creatinine of 1.2, eGFR of 49:   Secondary to ATN. With elevated vancomycin levels. HD was 4/22, 4/26,4/27, 4/28.  CT scan abd/pelvis negative for hydronephrosis. -Patient had hemodialysis yesterday. Urine output has dropped. We will monitor BUN and creatinine trend. Consider dialysis again tomorrow if oliguria persists.  2. Acute respiratory failure: Pt extubated 04/10/16. - Requires BiPAP periodically. Patient had ultrafiltration yesterday of 2.4 L.  3. Sepsis/Hypotension secondary to ESBL E. Coli Urinary tract infection and pneumonia (sputum with MRSA):  Blood pressure 129/48 this a.m. Patient not on pressors at this point in time.   4. Generalized edema - We achieved ultrafiltration of 2.4 L yesterday. We will consider additional ultrafiltration with dialysis tomorrow.   5. Morbid obesity - baseline functional status - Bed to chair    LOS: 20 Stclair Szymborski 5/4/20178:56 AM

## 2016-04-15 LAB — COMPREHENSIVE METABOLIC PANEL
ALT: 9 U/L — ABNORMAL LOW (ref 14–54)
AST: 14 U/L — AB (ref 15–41)
Albumin: 2.1 g/dL — ABNORMAL LOW (ref 3.5–5.0)
Alkaline Phosphatase: 25 U/L — ABNORMAL LOW (ref 38–126)
Anion gap: 9 (ref 5–15)
BUN: 50 mg/dL — AB (ref 6–20)
CO2: 30 mmol/L (ref 22–32)
Calcium: 8.6 mg/dL — ABNORMAL LOW (ref 8.9–10.3)
Chloride: 106 mmol/L (ref 101–111)
Creatinine, Ser: 2.93 mg/dL — ABNORMAL HIGH (ref 0.44–1.00)
GFR calc Af Amer: 19 mL/min — ABNORMAL LOW (ref >60)
GFR, EST NON AFRICAN AMERICAN: 17 mL/min — AB (ref >60)
Glucose, Bld: 118 mg/dL — ABNORMAL HIGH (ref 65–99)
Potassium: 3.8 mmol/L (ref 3.5–5.1)
SODIUM: 145 mmol/L (ref 135–145)
Total Bilirubin: 0.6 mg/dL (ref 0.3–1.2)
Total Protein: 6.3 g/dL — ABNORMAL LOW (ref 6.5–8.1)

## 2016-04-15 LAB — CBC
HCT: 21.7 % — ABNORMAL LOW (ref 35.0–47.0)
Hemoglobin: 6.8 g/dL — ABNORMAL LOW (ref 12.0–16.0)
MCH: 23.7 pg — ABNORMAL LOW (ref 26.0–34.0)
MCHC: 31.3 g/dL — ABNORMAL LOW (ref 32.0–36.0)
MCV: 75.6 fL — AB (ref 80.0–100.0)
PLATELETS: 188 10*3/uL (ref 150–440)
RBC: 2.87 MIL/uL — AB (ref 3.80–5.20)
RDW: 18.6 % — AB (ref 11.5–14.5)
WBC: 5.9 10*3/uL (ref 3.6–11.0)

## 2016-04-15 LAB — TRIGLYCERIDES: Triglycerides: 62 mg/dL (ref <150)

## 2016-04-15 LAB — GLUCOSE, CAPILLARY
GLUCOSE-CAPILLARY: 98 mg/dL (ref 65–99)
Glucose-Capillary: 115 mg/dL — ABNORMAL HIGH (ref 65–99)
Glucose-Capillary: 100 mg/dL — ABNORMAL HIGH (ref 65–99)
Glucose-Capillary: 104 mg/dL — ABNORMAL HIGH (ref 65–99)

## 2016-04-15 LAB — PHOSPHORUS: PHOSPHORUS: 4.4 mg/dL (ref 2.5–4.6)

## 2016-04-15 LAB — PREPARE RBC (CROSSMATCH)

## 2016-04-15 LAB — MAGNESIUM: MAGNESIUM: 1.7 mg/dL (ref 1.7–2.4)

## 2016-04-15 MED ORDER — SODIUM CHLORIDE 0.9 % IV SOLN
Freq: Once | INTRAVENOUS | Status: AC
  Administered 2016-04-15: 14:00:00 via INTRAVENOUS

## 2016-04-15 MED ORDER — TRACE MINERALS CR-CU-MN-SE-ZN 10-1000-500-60 MCG/ML IV SOLN
INTRAVENOUS | Status: DC
  Filled 2016-04-15: qty 960

## 2016-04-15 NOTE — Progress Notes (Signed)
PULMONARY / CRITICAL CARE MEDICINE   Name: Tasha Long MRN: [id number] DOB: 1956/10/21    ADMISSION DATE:  03/25/2016     PT PROFILE:  79 F in chronically very poor health developed abdominal pain and EMS was dispatched. Suffered respiratory arrest and brief cardiac arrest in the transfer from her bed to wheelchair and into ambulance. Intubated and transported to ED. Suffered brief PEA arrest again in ICU shortly after arrival  SUBJECTIVE:   Patient placed on BiPAP for worsening resp distress,in resp  distress at this time. patient attempted to take off biPAP, but increased WOB, remains full code   MAJOR EVENTS/TEST RESULTS: 04/14 TTE: 0000000, G1 diastrolic dysfcn, mild LA dil, mod RV dil, mild RA Dil. 4 chamber dilation, with mod sys dysfcn and mild diastolic dysfcn w/diffuse hypokinesis 04/14 LE venous US: poor quality study due to body habitus. No DVT seen 04/14 abdomen US: IMPRESSION: 1. No explanation for abdominal pain. Bowel gas obscures the lower abdominal aorta. Cholecystectomy. ECHO: EF 45%, diffuse hypokinesis, grade 1 diastolic dysfunction [id number] CT chest (wihtout contrast): Severe multilobar bronchopneumonia throughout the lungs bilaterally. Multiple borderline enlarged and minimally enlarged mediastinal and bilateral hilar lymph nodes, presumably reactive. Mild dilatation of the pulmonic trunk (3.5 cm in diameter), suggestive of pulmonary arterial hypertension 04/15 CTAP: No acute pathology 04/16 Renal Consultation 04/17 CT head: Minimal diffuse cortical atrophy. Mild chronic ischemic white matter disease. Mild bilateral maxillary and ethmoid sinusitis. No acute intracranial abnormality seen 04/18 EEG: EEG secondary to general background slowing. This finding may be seen with a diffuse disturbance that is etiologically nonspecific, but may include a metabolic encephalopathy, among other possibilities. No epileptiform activity was noted 04/19 Neurology  consultation: Prognosis for neurological recovery deemed guarded 04/21 intermittent HD initiated 04/26 received one unit PRBCs for Hgb 6.9 04/27 ENT consultation for planned trach tube placement 04/30 Dramatic improvement in neurologic function. RASS 0. + F/C. Passed SBT and extubated. Tolerated initially 5/1 on biPAP, wean as tolerated, high risk for intubation 5/3 back on biPAP dialysis  Removed 2.5 L 5/4 back on biPAP,  5/5 remains on BIPAP, placed back on transfer list to [facility]:: ETT 04/14 >> 04/30 R [facility] CVL 04/14 >>  L IJ HD cath 04/17 >>5/2 PiCC line 5/2   MICRO DATA: MRSA PCR 04/14 >> POS Urine 04/14 >> ESBL E coli (sens only to imipenem, pip-tazo, gent) Resp 04/14 >> few gram negative rods  Blood 04/14 >> NEG  Resp 04/19>>  MRSA  PCT algorithm 04/14-04/16: < 0.10, 1.48, 1.59 PCT 04/30: 0.36  ANTIMICROBIALS:  Zosyn 4/14 >> 4/14 Cefepime 4/14 >> 4/17 Metronidazole 4/14 >> 4/17 Meropenem 4/17 >> 04/29 vancomycin 4/14 >> 04/29   VITAL SIGNS: Temp:  [97.8 F (36.6 C)-98.8 F (37.1 C)] 98.4 F (36.9 C) (05/05 0500) Pulse Rate:  [51-67] 55 (05/05 0600) Resp:  [19-35] 21 (05/05 0600) BP: (111-161)/(41-132) 137/61 mmHg (05/05 0600) SpO2:  [92 %-100 %] 98 % (05/05 0600) FiO2 (%):  [35 %] 35 % (05/05 0600) HEMODYNAMICS:   VENTILATOR SETTINGS: Vent Mode:  [-]  FiO2 (%):  [35 %] 35 % INTAKE / OUTPUT:  Intake/Output Summary (Last 24 hours) at 04/15/16 [id number] Last data filed at 04/15/16 0600  Gross per 24 hour  Intake 635.77 ml  Output    360 ml  Net 275.77 ml    Review of Systems  Unable to perform ROS: acuity of condition  Constitutional: Positive for malaise/fatigue.  Respiratory: Positive for shortness of breath  and wheezing.   Psychiatric/Behavioral: The patient is nervous/anxious.   All other systems reviewed and are negative.   Physical Exam  Constitutional: She appears distressed.  Eyes: Pupils are equal, round, and reactive  to light.  Neck: Normal range of motion. Neck supple.  Cardiovascular: Normal rate and regular rhythm.   No murmur heard. Pulmonary/Chest: She is in respiratory distress. She has no wheezes. She has rales.  Abdominal: Soft. Bowel sounds are normal.  Musculoskeletal: She exhibits edema.  Neurological: She is alert. No cranial nerve deficit.  Skin: Skin is warm. She is diaphoretic.    LABS:  CBC  Recent Labs Lab 04/11/16 0427 04/12/16 0446 04/15/16 0535  WBC 6.6 6.5 5.9  HGB 7.3* 7.2* 6.8*  HCT 23.6* 23.4* 21.7*  PLT 231 238 188   Coag's No results for input(s): APTT, INR in the last 168 hours. BMET  Recent Labs Lab 04/13/16 0918 04/14/16 1650 04/15/16 0535  NA 145 142 145  K 4.1 3.9 3.8  CL 107 105 106  CO2 24 29 30   BUN 79* 46* 50*  CREATININE 3.77* 2.70* 2.93*  GLUCOSE 89 104* 118*   Electrolytes  Recent Labs Lab 04/13/16 0918 04/14/16 1650 04/15/16 0535  CALCIUM 8.6* 8.3* 8.6*  MG  --  1.7 1.7  PHOS 5.9* 4.2 4.4   Sepsis Markers  Recent Labs Lab 04/10/16 0426  PROCALCITON 0.36   ABG  Recent Labs Lab 04/09/16 0350 04/14/16 1130  PHART 7.48* 7.40  PCO2ART 43 54*  PO2ART 144* 86   Liver Enzymes  Recent Labs Lab 04/10/16 0426  04/13/16 0918 04/14/16 1650 04/15/16 0535  AST 19  --   --  15 14*  ALT 12*  --   --  9* 9*  ALKPHOS 34*  --   --  28* 25*  BILITOT 0.4  --   --  0.8 0.6  ALBUMIN 2.1*  < > 2.2* 2.1* 2.1*  < > = values in this interval not displayed. Cardiac Enzymes  Recent Labs Lab 04/14/16 1150 04/14/16 1650 04/14/16 1750  TROPONINI 0.04* 0.04* 0.04*   Glucose  Recent Labs Lab 04/14/16 0333 04/14/16 0732 04/14/16 1155 04/14/16 1610 04/14/16 1936 04/15/16 0009  GLUCAP 91 79 79 85 92 98    CXR: [place]   ASSESSMENT / PLAN: 67 morbidly obese white female with multiorgan dysfunction, with CHF, renal failure and OSA/OHS She will need noninvasive ventilation with BiPAP in order to live and survive and she will  die without it Her quality of life is poor  Patient has chronic resp acidosis with pco2 of 52 with near normal pH and she will need oxygen therapy continious  PULMONARY A: S/P respiratory arrest COPD, Smoker PTA OSA Concern for PE based on RFs and initial presentation Prolonged VDRF P:  On BiPAP  Supplemental O2 to maintain SpO2 > 90 % BiPAP goal:  PRN BiPAP during day Mandatory BiPAP @ night Placed on biPAP for acute resp distress again   CARDIOVASCULAR A:  Cardiac arrest due to respiratory arrest Hypertension, controlled Hyperlipidemia P:  Monitor BP and rhythm MAP goal > 65 mmhg -cardiology consult to optimize chf meds s/p NSTEMI  RENAL A:  AKI, oliguric > nonoliguric Hyperkalemia, resolved P:  Monitor BMET intermittently Monitor I/Os Correct electrolytes as indicated Cont IHD per renal service  GASTROINTESTINAL A:  Morbid obesity Abdominal pain, nonspecific - no clear etiology Diarrhea Post extubation dysphagia P:  SUP: IV PPI NPO post extubation SLP eval ordered for 05/01  HEMATOLOGIC A:  ICU acquired anemia with microcytosis - no overt bleeding P:  DVT px: full dose heparin Monitor CBC intermittently Transfuse per usual guidelines D/C Heparin infusion.  INFECTIOUS A:  MRSA PNA - fully treated Possible LE cellulitis - treated Prior hx of MRSA cellulitis ESBL in the urine - treated P:  Monitor temp, WBC count Completed course of antibiotics.  ENDOCRINE A:  Mild hyperglycemia without prior history of DM Hypothyroidism P:  Change levothyroxine to IV until able to take PO meds Cont SSI - change to sens scale while NPO  NEUROLOGIC A:  Post arrest anoxic encephalopathy, much improved ICU associated discomfort Intermittent agitation Severe deconditioning P:  RASS goal: 0 Continue dexmedetomidine  Minimize sedating meds   Family updated today-will meet again today for code status and plan of  care,prognosis is very poor Will place back on transfer list to [facility]  I have personally obtained a history, examined the patient, evaluated Pertinent laboratory and [employer]/imaging results, and  formulated the assessment and plan   The Patient requires high complexity decision making for assessment and support, frequent evaluation and titration of therapies, application of advanced monitoring technologies and extensive interpretation of multiple databases. Critical Care Time devoted to patient care services described in this note is 40 minutes.   Overall, patient is critically ill, prognosis is guarded.  Patient with Multiorgan failure and at high risk for cardiac arrest and death.    Corrin Parker, M.D.  [facility]  Medical Director [place] Director [facility] Cardio-Pulmonary Department

## 2016-04-15 NOTE — Progress Notes (Signed)
Chaplain rounded the unit and provided a compassionate presence and support to the patient. Chaplain Dezi Brauner [phone] 

## 2016-04-15 NOTE — Progress Notes (Signed)
[facility] Encounter Note  Patient: Tasha Long / Admit Date: 03/25/2016 / Date of Encounter: 04/15/2016, 7:48 AM   Subjective: Patient with less respiratory distress than admission. Patient hemodynamically stable. Patient has BiPAP machine. Overall improved  Review of Systems: Positive for: Shortness of breath Negative for: Vision change, hearing change, syncope, dizziness, nausea, vomiting,diarrhea, bloody stool, stomach pain, cough, congestion, diaphoresis, urinary frequency, urinary pain,skin lesions, skin rashes Others previously listed  Objective: Telemetry: Normal sinus rhythm Physical Exam: Blood pressure 131/44, pulse 59, temperature 98.2 F (36.8 C), temperature source Axillary, resp. rate 19, height 5\' 5"  (1.651 m), weight 366 lb (166.017 kg), SpO2 81 %. Body mass index is 60.91 kg/(m^2). General: Well developed, well nourished, in no acute distress. Head: Normocephalic, atraumatic, sclera non-icteric, no xanthomas, nares are without discharge. Neck: No apparent masses Lungs: Normal respirations with diffuse wheezes, no rhonchi, no rales , basilar crackles   Heart: Regular rate and rhythm, normal S1 S2, no murmur, no rub, no gallop, PMI is normal size and placement, carotid upstroke normal without bruit, jugular venous pressure normal Abdomen: Soft, non-tender,  distended with normoactive bowel sounds. No hepatosplenomegaly. Abdominal aorta is normal size without bruit Extremities: 1+ edema, no clubbing, no cyanosis, positive ulcers,  Peripheral: 2+ radial, 0 + femoral, 1 + dorsal pedal pulses Neuro: Alert and oriented. Moves all extremities spontaneously. Psych:  Responds to questions appropriately with a normal affect.   Intake/Output Summary (Last 24 hours) at 04/15/16 0748 Last data filed at 04/15/16 0600  Gross per 24 hour  Intake 635.77 ml  Output    360 ml  Net 275.77 ml    Inpatient Medications:  . budesonide (PULMICORT)  nebulizer solution  0.25 mg Nebulization 4 times per day  . heparin subcutaneous  5,000 Units Subcutaneous Q8H  . insulin aspart  0-9 Units Subcutaneous Q4H  . ipratropium-albuterol  3 mL Nebulization Q6H  . levothyroxine  37.5 mcg Intravenous Daily  . pantoprazole (PROTONIX) IV  40 mg Intravenous Q24H   Infusions:  . dexmedetomidine 0.4 mcg/kg/hr (04/15/16 0725)  . TPN (CLINIMIX) Adult without lytes 40 mL/hr at 04/14/16 2020    Labs:  Recent Labs  04/14/16 1650 04/15/16 0535  NA 142 145  K 3.9 3.8  CL 105 106  CO2 29 30  GLUCOSE 104* 118*  BUN 46* 50*  CREATININE 2.70* 2.93*  CALCIUM 8.3* 8.6*  MG 1.7 1.7  PHOS 4.2 4.4    Recent Labs  04/14/16 1650 04/15/16 0535  AST 15 14*  ALT 9* 9*  ALKPHOS 28* 25*  BILITOT 0.8 0.6  PROT 6.2* 6.3*  ALBUMIN 2.1* 2.1*    Recent Labs  04/15/16 0535  WBC 5.9  HGB 6.8*  HCT 21.7*  MCV 75.6*  PLT 188    Recent Labs  04/14/16 1150 04/14/16 1650 04/14/16 1750  TROPONINI 0.04* 0.04* 0.04*   Invalid input(s): POCBNP No results for input(s): HGBA1C in the last 72 hours.   Weights: Filed Weights   04/13/16 0356 04/14/16 0402 04/15/16 0740  Weight: 398 lb 13 oz (180.9 kg) 374 lb (169.645 kg) 366 lb (166.017 kg)     Radiology/Studies:  Ct Abdomen Pelvis Wo Contrast  03/26/2016  CLINICAL DATA:  60 year old female with history of abdominal pain. Respiratory arrest and brief cardiac arrest. EXAM: CT CHEST, ABDOMEN AND PELVIS WITHOUT CONTRAST TECHNIQUE: Multidetector CT imaging of the chest, abdomen and pelvis was performed following the standard protocol without IV contrast. COMPARISON:  No priors. FINDINGS: CT  CHEST FINDINGS Mediastinum/Lymph Nodes: Heart size is mildly enlarged. There is no significant pericardial fluid, thickening or pericardial calcification. Mild dilatation of the pulmonic trunk (3.5 cm in diameter). Multiple borderline enlarged and mildly enlarged mediastinal and hilar lymph nodes measuring up to 12 mm  in short axis in the low right paratracheal nodal station. Small hiatal hernia. Near complete collapse of the distal trachea and main bronchi on expiratory phase imaging, indicative of severe tracheobronchomalacia. No axillary lymphadenopathy. Lungs/Pleura: Diffuse bronchial wall thickening with diffuse centrilobular ground-glass attenuation micro and macronodularity throughout all aspects of the lungs bilaterally, forming into near confluent opacities in the right lower lobe, most compatible with severe multilobar bronchopneumonia. No pleural effusions. Musculoskeletal/Soft Tissues: There are no aggressive appearing lytic or blastic lesions noted in the visualized portions of the skeleton. CT ABDOMEN AND PELVIS FINDINGS Hepatobiliary: Small calcified granuloma in the right lobe of the liver. No definite cystic or solid hepatic lesions are identified within the liver on today's noncontrast CT examination. Gallbladder is not visualized, presumably surgically absent (no surgical clips are noted in the gallbladder fossa). Pancreas: No definite pancreatic mass or peripancreatic inflammatory changes on today's noncontrast CT examination. Spleen: Small calcified granulomas in the spleen. Adrenals/Urinary Tract: Unenhanced appearance of the adrenal glands and kidneys bilaterally is normal. No hydroureteronephrosis. Urinary bladder is nearly completely decompressed around an indwelling Foley catheter. Stomach/Bowel: Unenhanced appearance of the stomach is normal. There is no pathologic dilatation of small bowel or colon. The appendix is not confidently identified may be surgically absent. Regardless, there are no inflammatory changes noted adjacent to the cecum to suggest the presence of an acute appendicitis at this time. Vascular/Lymphatic: Mild atherosclerotic calcifications throughout the abdominal and pelvic vasculature, without definite aneurysm. No lymphadenopathy noted in the abdomen or pelvis. Reproductive: Status  post hysterectomy. Ovaries are not confidently identified may be surgically absent or atrophic. Other: Trace volume of ascites.  No pneumoperitoneum. Musculoskeletal: There are no aggressive appearing lytic or blastic lesions noted in the visualized portions of the skeleton. IMPRESSION: 1. Severe multilobar bronchopneumonia throughout the lungs bilaterally. 2. Multiple borderline enlarged and minimally enlarged mediastinal and bilateral hilar lymph nodes, presumably reactive. 3. Mild dilatation of the pulmonic trunk (3.5 cm in diameter), suggestive of pulmonary arterial hypertension. 4. Mild cardiomegaly. 5. Atherosclerosis. 6. Additional incidental findings, as above. Electronically Signed   By: Vinnie Langton M.D.   On: 03/26/2016 14:56   Dg Chest 1 View  04/09/2016  CLINICAL DATA:  CCU PT, Dyspnea EXAM: CHEST 1 VIEW COMPARISON:  04/08/2016 FINDINGS: Stable cardiac enlargement and support devices. Mild diffuse interstitial prominence. No definite pulmonary edema. No consolidation or effusion. IMPRESSION: No significant change. Electronically Signed   By: Skipper Cliche M.D.   On: 04/09/2016 08:13   Dg Chest 1 View  04/08/2016  CLINICAL DATA:  Dyspnea EXAM: CHEST 1 VIEW COMPARISON:  04/07/2016 FINDINGS: Cardiac shadow is stable. A an endotracheal tube, nasogastric catheter, right subclavian central line and left jugular dialysis catheter are again noted and stable. Improved aeration left base is noted. No focal infiltrate is seen. No pneumothorax is noted. IMPRESSION: Tubes and lines as described. No acute abnormality is seen. Electronically Signed   By: Inez Catalina M.D.   On: 04/08/2016 07:11   Dg Chest 1 View  04/07/2016  CLINICAL DATA:  Shortness of breath. EXAM: CHEST 1 VIEW COMPARISON:  04/06/2017. FINDINGS: Endotracheal tube, right subclavian, left IJ line, NG tube. Cardiomegaly with increasing interstitial prominence suggesting congestive heart failure. Small left pleural  effusion cannot be  excluded. No pneumothorax. IMPRESSION: 1.  Lines and tubes in stable position. 2. Congestive heart failure with slight increase in interstitial edema. Small left pleural effusion . Electronically Signed   By: Marcello Moores  Register   On: 04/07/2016 07:00   Dg Chest 1 View  04/06/2016  CLINICAL DATA:  Cardiopulmonary arrest with resuscitation, sepsis, community-acquired pneumonia. EXAM: CHEST 1 VIEW COMPARISON:  Portable chest x-ray of April 05, 2016 FINDINGS: The lungs remain well-expanded. The interstitial markings of both lungs are slightly more conspicuous overall today. The cardiac silhouette is enlarged. The central pulmonary vascularity is engorged. There is no alveolar edema all, pleural effusion, or pneumothorax. The endotracheal tube tip lies 3 cm above the carina. The esophagogastric tube tip projects below the inferior margin of the image. The left internal jugular venous catheter tip projects over the cavoatrial junction. The right subclavian venous catheter tip projects over the proximal SVC. IMPRESSION: Slight interval increase in pulmonary interstitial edema. Persistent cardiomegaly and central pulmonary vascular congestion. No pneumonia nor pulmonary edema is observed. Electronically Signed   By: David  Martinique M.D.   On: 04/06/2016 07:56   Dg Chest 1 View  04/05/2016  CLINICAL DATA:  Respiratory failure, cardiopulmonary arrest, community-acquired pneumonia, intubated patient, former smoker. EXAM: CHEST 1 VIEW COMPARISON:  Chest x-ray of April 03, 2016 FINDINGS: The lungs are well-expanded. The interstitial markings remain increased but have improved since the previous study. There is no pneumothorax or pleural effusion. The cardiac silhouette remains enlarged. The pulmonary vascularity is prominent centrally but the congestion has decreased. The endotracheal tube tip lies approximately 3.8 cm above the carina. The esophagogastric tube tip projects below the inferior margin of the image. The  dual-lumen left internal jugular catheter tip projects over the cavoatrial junction. The right subclavian venous catheter tip projects over the proximal SVC. IMPRESSION: Improved appearance of the pulmonary interstitium suggesting resolving interstitial edema. The support tubes are in stable position. Electronically Signed   By: David  Martinique M.D.   On: 04/05/2016 07:22   Dg Chest 1 View  04/03/2016  CLINICAL DATA:  Cardiopulmonary arrest.  Respiratory failure EXAM: CHEST 1 VIEW FINDINGS: Endo trachea tube, feeding tube, LEFT RIGHT central venous line unchanged. Stable cardiac silhouette. No effusion no pneumothorax. Mild central venous congestion. No pulmonary edema. IMPRESSION: Stable support apparatus. Central venous congestion.  No interval change. Electronically Signed   By: Suzy Bouchard M.D.   On: 04/03/2016 11:44   Dg Chest 1 View  03/28/2016  CLINICAL DATA:  OG tube placement EXAM: CHEST 1 VIEW COMPARISON:  Chest radiograph dated 03/28/2016 at 1626 hours FINDINGS: Endotracheal tube terminates 3 cm above the carina. Increased interstitial markings, corresponding to known tree-in-bud nodularity/multifocal pneumonia, better evaluated on CT. No definite pleural effusions or pneumothorax. Right subclavian venous catheter terminates in the upper SVC. Left IJ venous catheter terminates in the low right atrium. Enteric tube courses below the diaphragm. IMPRESSION: Endotracheal tube terminates 3 cm above the carina. Enteric tube courses below the diaphragm. Additional stable support apparatus as above. Electronically Signed   By: Julian Hy M.D.   On: 03/28/2016 19:07   Dg Chest 1 View  03/28/2016  CLINICAL DATA:  Status post central line placement EXAM: CHEST 1 VIEW COMPARISON:  03/28/2016 FINDINGS: Endotracheal tube and nasogastric catheter are again noted and stable. Stable right subclavian central line is noted in the proximal superior vena cava. A new left jugular catheter is noted extending  deep into the right atrium. This could  be withdrawn 2-3 cm. No pneumothorax is noted. Persistent infiltrative changes are noted in both lungs. Cardiac shadow is stable. IMPRESSION: New left jugular catheter extending deep into the right atrium. This could be withdrawn 2-3 cm. No pneumothorax is noted. The remainder of the exam is stable. Electronically Signed   By: Inez Catalina M.D.   On: 03/28/2016 16:44   Dg Chest 1 View  03/28/2016  CLINICAL DATA:  60 year old female with dyspnea. Subsequent encounter. EXAM: CHEST 1 VIEW COMPARISON:  03/27/2016. FINDINGS: Endotracheal tube with tip 2.8 cm above the carina. Right central line tip proximal superior vena cava level. Severe multi lobular bronchopneumonia throughout both lungs noted on recent CT not as well delineated on the present plain film exam. Pulmonary vascular congestion. Follow-up until clearance recommended to exclude underlying malignancy. Cardiomegaly. IMPRESSION: Severe multi lobular bronchopneumonia throughout both lungs noted on recent CT not as well delineated on the present plain film exam. Pulmonary vascular congestion. Electronically Signed   By: Genia Del M.D.   On: 03/28/2016 07:50   Dg Chest 1 View  03/27/2016  CLINICAL DATA:  60 year old female with a history of respiratory failure. EXAM: CHEST 1 VIEW COMPARISON:  CT 03/26/2016, chest x-ray 03/26/2016, 03/25/2016 FINDINGS: Cardiomediastinal silhouette unchanged. Multifocal bilateral airspace disease, better characterized on prior CT. No large pleural effusion.  No visualized pneumothorax. Endotracheal tube remains in position, terminating approximately 3.5 cm above the carina. Right subclavian central venous catheter appearing to terminate in the superior vena cava. Overlying EKG leads. Interval removal of the defibrillator pads Gastric tube projects over the mediastinum and terminates out of the field of view. IMPRESSION: Similar appearance of multifocal pneumonia with no large  pleural effusion. Interval removal of defibrillator pads, with otherwise unchanged support apparatus. Signed, Dulcy Fanny. Earleen Newport, DO [facility] Electronically Signed   By: Corrie Mckusick D.O.   On: 03/27/2016 08:23   Dg Chest 1 View  03/26/2016  CLINICAL DATA:  Dyspnea. EXAM: CHEST 1 VIEW COMPARISON:  03/25/2016. FINDINGS: Endotracheal tube in satisfactory position. Right jugular catheter tip in the proximal superior vena cava. The nasogastric tube is poorly visualized inferiorly. Stable enlarged cardiac silhouette and prominent interstitial markings. There is a persistent rounded density the in the inferior perihilar region on the right, measuring approximately 4.3 cm in maximum diameter. IMPRESSION: 1. Stable mass or rounded area of atelectasis or pneumonia in the right inferior perihilar region. 2. Stable cardiomegaly and chronic interstitial lung disease. Electronically Signed   By: Claudie Revering M.D.   On: 03/26/2016 07:37   Dg Chest 1 View  03/25/2016  CLINICAL DATA:  Central line placement. EXAM: CHEST 1 VIEW COMPARISON:  03/25/2016.  05/15/2013 . FINDINGS: Endotracheal tube tip 4.3 cm above the carina. NG tube tip below left hemidiaphragm. Right central line noted with tip projected superior vena cava. Right perihilar rounded infiltrate. Pneumonia could present this fashion. Mass lesion cannot be excluded. Cardiomegaly with mild pulmonary venous interstitial prominence. Mild congestive heart failure cannot be excluded. Close follow-up chest x-rays are suggested demonstrate clearing of these findings. Degenerative changes thoracic spine. IMPRESSION: 1. Endotracheal tube 4.3 cm above the carina. NG tube tip below left hemidiaphragm. Right central line noted with tip projected superior vena cava. No complicating features. No pneumothorax. 2. Right perihilar infiltrate versus mass lesion. Close follow-up chest x-rays recommended to demonstrate complete  clearing. Further evaluation with chest CT can be obtained as needed . 3. Cardiomegaly with mild pulmonary vascular prominence and interstitial prominence.  A mild component congestive heart failure cannot be excluded . Electronically Signed   By: Marcello Moores  Register   On: 03/25/2016 11:43   Dg Abd 1 View  03/28/2016  CLINICAL DATA:  OG tube placement EXAM: ABDOMEN - 1 VIEW COMPARISON:  CT abdomen pelvis dated 03/26/2016 FINDINGS: Enteric tube terminates in the distal gastric body. IMPRESSION: Enteric tube terminates in the distal gastric body. Electronically Signed   By: Julian Hy M.D.   On: 03/28/2016 19:09   Dg Abd 1 View  03/26/2016  CLINICAL DATA:  60 year old female with abdominal ileus EXAM: ABDOMEN - 1 VIEW COMPARISON:  Chest x-ray obtained earlier today FINDINGS: Limited study secondary to patient body habitus. A total of 3 radiographs were obtained in an effort to encompass the entirety of the abdomen. The tip of the nasogastric tube overlies the upper stomach just within the gastroesophageal junction. The bowel gas pattern does not appear obstructed. The lung bases are clear. IMPRESSION: 1. The tip of the nasogastric tube projects over the gastroesophageal junction. Consider advancing 5 cm for more optimal placement. 2. Unremarkable bowel gas pattern given limitations of the study related to patient body habitus. Electronically Signed   By: Jacqulynn Cadet M.D.   On: 03/26/2016 09:12   Ct Head Wo Contrast  03/28/2016  CLINICAL DATA:  Unresponsive, acute respiratory failure. EXAM: CT HEAD WITHOUT CONTRAST TECHNIQUE: Contiguous axial images were obtained from the base of the skull through the vertex without intravenous contrast. COMPARISON:  None. FINDINGS: Bony calvarium appears intact. Mild bilateral maxillary and ethmoid sinusitis is noted. Minimal diffuse cortical atrophy is noted. Mild chronic ischemic white matter disease is noted. No mass effect or midline shift is noted. Ventricular  size is within normal limits. There is no evidence of mass lesion, hemorrhage or acute infarction. IMPRESSION: Minimal diffuse cortical atrophy. Mild chronic ischemic white matter disease. Mild bilateral maxillary and ethmoid sinusitis. No acute intracranial abnormality seen. Electronically Signed   By: Marijo Conception, M.D.   On: 03/28/2016 12:52   Ct Chest Wo Contrast  03/26/2016  CLINICAL DATA:  60 year old female with history of abdominal pain. Respiratory arrest and brief cardiac arrest. EXAM: CT CHEST, ABDOMEN AND PELVIS WITHOUT CONTRAST TECHNIQUE: Multidetector CT imaging of the chest, abdomen and pelvis was performed following the standard protocol without IV contrast. COMPARISON:  No priors. FINDINGS: CT CHEST FINDINGS Mediastinum/Lymph Nodes: Heart size is mildly enlarged. There is no significant pericardial fluid, thickening or pericardial calcification. Mild dilatation of the pulmonic trunk (3.5 cm in diameter). Multiple borderline enlarged and mildly enlarged mediastinal and hilar lymph nodes measuring up to 12 mm in short axis in the low right paratracheal nodal station. Small hiatal hernia. Near complete collapse of the distal trachea and main bronchi on expiratory phase imaging, indicative of severe tracheobronchomalacia. No axillary lymphadenopathy. Lungs/Pleura: Diffuse bronchial wall thickening with diffuse centrilobular ground-glass attenuation micro and macronodularity throughout all aspects of the lungs bilaterally, forming into near confluent opacities in the right lower lobe, most compatible with severe multilobar bronchopneumonia. No pleural effusions. Musculoskeletal/Soft Tissues: There are no aggressive appearing lytic or blastic lesions noted in the visualized portions of the skeleton. CT ABDOMEN AND PELVIS FINDINGS Hepatobiliary: Small calcified granuloma in the right lobe of the liver. No definite cystic or solid hepatic lesions are identified within the liver on today's noncontrast  CT examination. Gallbladder is not visualized, presumably surgically absent (no surgical clips are noted in the gallbladder fossa). Pancreas: No definite pancreatic mass or peripancreatic inflammatory changes  on today's noncontrast CT examination. Spleen: Small calcified granulomas in the spleen. Adrenals/Urinary Tract: Unenhanced appearance of the adrenal glands and kidneys bilaterally is normal. No hydroureteronephrosis. Urinary bladder is nearly completely decompressed around an indwelling Foley catheter. Stomach/Bowel: Unenhanced appearance of the stomach is normal. There is no pathologic dilatation of small bowel or colon. The appendix is not confidently identified may be surgically absent. Regardless, there are no inflammatory changes noted adjacent to the cecum to suggest the presence of an acute appendicitis at this time. Vascular/Lymphatic: Mild atherosclerotic calcifications throughout the abdominal and pelvic vasculature, without definite aneurysm. No lymphadenopathy noted in the abdomen or pelvis. Reproductive: Status post hysterectomy. Ovaries are not confidently identified may be surgically absent or atrophic. Other: Trace volume of ascites.  No pneumoperitoneum. Musculoskeletal: There are no aggressive appearing lytic or blastic lesions noted in the visualized portions of the skeleton. IMPRESSION: 1. Severe multilobar bronchopneumonia throughout the lungs bilaterally. 2. Multiple borderline enlarged and minimally enlarged mediastinal and bilateral hilar lymph nodes, presumably reactive. 3. Mild dilatation of the pulmonic trunk (3.5 cm in diameter), suggestive of pulmonary arterial hypertension. 4. Mild cardiomegaly. 5. Atherosclerosis. 6. Additional incidental findings, as above. Electronically Signed   By: Vinnie Langton M.D.   On: 03/26/2016 14:56   US Abdomen Complete  03/25/2016  CLINICAL DATA:  Abdominal pain EXAM: ABDOMEN ULTRASOUND COMPLETE COMPARISON:  None. FINDINGS: Gallbladder:  Surgically absent Common bile duct: Diameter: 3 mm. Where visualized, no filling defect. Liver: No focal lesion identified. Within normal limits in parenchymal echogenicity. IVC: No abnormality visualized. Pancreas: Visualized portion unremarkable. Spleen: Size and appearance within normal limits. Right Kidney: Length: 10 cm. Echogenicity within normal limits. No mass or hydronephrosis visualized. Left Kidney: Length: 11 cm. Echogenicity within normal limits. No mass or hydronephrosis visualized. Abdominal aorta: Normal diameter proximal aorta at 22 mm. The mid and distal aorta was obscured by bowel gas. Other findings: None. IMPRESSION: 1. No explanation for abdominal pain. 2. Bowel gas obscures the lower abdominal aorta. 3. Cholecystectomy. Electronically Signed   By: Monte Fantasia M.D.   On: 03/25/2016 08:25   US Venous Img Lower Bilateral  03/25/2016  CLINICAL DATA:  Bilateral lower extremity edema, morbid obesity EXAM: BILATERAL LOWER EXTREMITY VENOUS DOPPLER ULTRASOUND TECHNIQUE: Gray-scale sonography with graded compression, as well as color Doppler and duplex ultrasound were performed to evaluate the lower extremity deep venous systems from the level of the common femoral vein and including the common femoral, femoral, profunda femoral, popliteal and calf veins including the posterior tibial, peroneal and gastrocnemius veins when visible. The superficial great saphenous vein was also interrogated. Spectral Doppler was utilized to evaluate flow at rest and with distal augmentation maneuvers in the common femoral, femoral and popliteal veins. COMPARISON:  None. FINDINGS: Limited exam because of body habitus and condition. RIGHT LOWER EXTREMITY Common Femoral Vein: No evidence of thrombus. Normal compressibility, respiratory phasicity and response to augmentation. Saphenofemoral Junction: No evidence of thrombus. Normal compressibility and flow on color Doppler imaging. Profunda Femoral Vein: No evidence  of thrombus. Normal compressibility and flow on color Doppler imaging. Femoral Vein: No evidence of thrombus. Normal compressibility, respiratory phasicity and response to augmentation. Popliteal Vein: No evidence of thrombus. Normal compressibility, respiratory phasicity and response to augmentation. Calf Veins: Very limited assessment, difficult to exclude small calf thrombus. Superficial Great Saphenous Vein: No evidence of thrombus. Normal compressibility and flow on color Doppler imaging. Venous Reflux:  None. Other Findings:  Peripheral subcutaneous edema noted. LEFT LOWER EXTREMITY Common Femoral Vein:  No evidence of thrombus. Normal compressibility, respiratory phasicity and response to augmentation. Saphenofemoral Junction: No evidence of thrombus. Normal compressibility and flow on color Doppler imaging. Profunda Femoral Vein: No evidence of thrombus. Normal compressibility and flow on color Doppler imaging. Femoral Vein: No evidence of thrombus. Normal compressibility, respiratory phasicity and response to augmentation. Popliteal Vein: No evidence of thrombus. Normal compressibility, respiratory phasicity and response to augmentation. Calf Veins: Very limited assessment, it difficult to exclude small calf thrombus. Superficial Great Saphenous Vein: No evidence of thrombus. Normal compressibility and flow on color Doppler imaging. Venous Reflux:  None. Other Findings:  Peripheral edema noted. IMPRESSION: Limited exam because of body habitus. No significant occlusive femoral popliteal DVT demonstrated in either extremity. Limited assessment of the calf veins. Electronically Signed   By: Jerilynn Mages.  Shick M.D.   On: 03/25/2016 16:16   Dg Chest Port 1 View  04/12/2016  CLINICAL DATA:  Post PICC line placement EXAM: PORTABLE CHEST 1 VIEW COMPARISON:  04/10/2016 FINDINGS: Interval placement of right jugular approach dual lumen PICC line with tip projected over at least the superior cavoatrial junction. Interval  extubation and removal of enteric tube. Otherwise, stable positioning remaining support apparatus including tip of right subclavian vein approach central venous catheter projected over the superior SVC. No supine evidence of pneumothorax. Grossly unchanged enlarged cardiac silhouette and mediastinal contours with atherosclerotic plaque within the thoracic aorta. Pulmonary vasculature appears less distinct than present examination with cephalization of flow. Veiling opacities overlying the bilateral lower lungs are favored to represent overlying breast tissues. No focal airspace opacities. No supine evidence of pleural effusion. No pneumothorax. IMPRESSION: 1. Right jugular approach dual lumen PICC line tip projects over the superior cavoatrial junction. No supine evidence of pneumothorax. 2. Interval extubation and removal of enteric tube. 3. Findings suggestive of worsening pulmonary edema. Electronically Signed   By: Sandi Mariscal M.D.   On: 04/12/2016 21:44   Dg Chest Port 1 View  04/10/2016  CLINICAL DATA:  Patient is on a bipap machine and is being seen for respiratory failure. Hx obesity, former smoker EXAM: PORTABLE CHEST 1 VIEW COMPARISON:  04/09/2016 FINDINGS: Support devices unchanged. Stable mild cardiac silhouette enlargement and mild vascular congestion. Mild diffuse interstitial prominence without definite pulmonary edema. No consolidation. Inferior costophrenic angles excluded from the image. IMPRESSION: No significant change from prior radiograph. Electronically Signed   By: Skipper Cliche M.D.   On: 04/10/2016 07:30   Dg Chest Port 1 View  04/02/2016  CLINICAL DATA:  Acute respiratory failure. EXAM: PORTABLE CHEST 1 VIEW COMPARISON:  04/01/2016 FINDINGS: Lungs mildly hyperexpanded. There are prominent interstitial markings, stable from previous day's exam. No convincing pneumonia or pulmonary edema. No pleural effusion or pneumothorax. Cardiac silhouette is normal in size. Endotracheal tube tip  projects 2 cm above the Carina, well positioned. Oral/nasogastric tube passes below the diaphragm into the stomach. Left internal jugular dual-lumen tunneled central venous catheter tip projects in the right atrium. Right subclavian central venous line catheter tip projects in the mid superior vena cava. No change in support apparatus. IMPRESSION: 1. Prominent interstitial markings at lung hyperexpansion. Overall, lung aeration has continuing improved since studies dated 03/25/2016. No new lung abnormalities. 2. Support apparatus is stable as described. Electronically Signed   By: Lajean Manes M.D.   On: 04/02/2016 07:51   Dg Chest Port 1 View  04/01/2016  CLINICAL DATA:  Respiratory arrest, cardiopulmonary arrest with resuscitation, morbid obesity, asthma, former smoker. EXAM: PORTABLE CHEST 1 VIEW COMPARISON:  Portable  chest x-ray of March 28, 2016 FINDINGS: The lungs are well-expanded. The hemidiaphragms are excluded from the field of view however. The heart is normal in size. The central pulmonary vascularity is engorged. The pulmonary interstitial markings remain increased. The endotracheal tube tip lies 2.7 cm above the carina. The esophagogastric tube tip projects below the inferior margin of the image. The dialysis catheter tip projects below the inferior margin of the image is well. The right subclavian venous catheter tip projects over the proximal SVC. IMPRESSION: Limited study with exclusion of the hemidiaphragms. There is persistent pulmonary interstitial edema and mild cardiomegaly. There is no pneumothorax. The support tubes are in reasonable position where visualized. Electronically Signed   By: David  Martinique M.D.   On: 04/01/2016 10:13   Dg Chest Portable 1 View  03/25/2016  CLINICAL DATA:  Shortness of breath EXAM: PORTABLE CHEST 1 VIEW COMPARISON:  05/15/2013 FINDINGS: Endotracheal tube tip between the clavicular heads and carina. Diffuse coarse reticular nodular opacities. Borderline  cardiomegaly, accentuated by technique. Negative aortic and hilar contours for technique. No effusion or pneumothorax. No Kerley lines. IMPRESSION: 1. Unremarkable endotracheal tube positioning. 2. Diffuse interstitial opacity, favor atypical infection or aspiration over CHF. 3. Probable background COPD. Electronically Signed   By: Monte Fantasia M.D.   On: 03/25/2016 07:56     Assessment and Recommendation  60 y.o. female with chronic obstructive pulmonary disease sleep apnea essential hypertension chronic kidney disease stage III possible lower extremity cellulitis with respiratory failure requiring intubation and oxygenation causing acute Systolic dysfunction congestive heart failure and pulmonary edema as well as demand ischemia with elevated troponin now improving with oxygenation and appropriate therapy. The patient's cardiac issues mainly stem from respiratory failure 1. No additional medication management or diuresis with diuretics due to improvements with oxygenation only and concerns of worsening chronic kidney disease 2. No further intervention of elevated troponin most consistent with demand ischemia and hypoxia 3. Lower ejection fraction likely to improve with oxygenation after respiratory arrest with current ejection fraction of 40% 4. No further cardiac intervention or diagnostics necessary at this time 5. Begin ambulation if possible and follow for further need for additional medication management including beta blocker for above with cardiac rehabilitation as able 6. Call if further questions  Signed, Serafina Royals M.D. FACC

## 2016-04-15 NOTE — Progress Notes (Signed)
Pre Tx

## 2016-04-15 NOTE — Progress Notes (Signed)
Nutrition Follow-up  DOCUMENTATION CODES:   Morbid obesity  INTERVENTION:  -Discussed TPN poc with MD Mortimer Fries and MD Lateef; volume and electrolyte concerns discussed in detail with MD Holley Raring; MD would like to continue current TPN prescription of 5%AA/15%Dextrose WITHOUT electrolytes today at current rate of 40 ml/hr (960 mL of fluid, 48 g of protein (provides 0.8 g/kg IBW or 0.3 g/kg current wt) and 682 kcals. Goal is to maximize protein intake. Rate of TPN prescription needed to meet at least 80% of protein needs is 80 ml/hr; discussed with MD Lateef. Plan is hopefully titrate to rate of 60 ml/hr this weekend and plan for titration to 80 ml/hr next week. MD is aware that current TPN does not meet protein or calorie needs. Will reassess daily -Discussed possibility of NG placement for TF to replace TPN but pt at high risk to be placed back on Bipap per MD [employer], Add [employer] to meal trays as well to maximize nutrition via po intake.    NUTRITION DIAGNOSIS:   Inadequate oral intake related to acute illness as evidenced by NPO status.  GOAL:   Patient will meet greater than or equal to 90% of their needs  MONITOR:   Diet advancement, Labs, I & O's, Weight trends  REASON FOR ASSESSMENT:   Ventilator    ASSESSMENT:    Pt receiving HD today with UF goal of 1.5 L today; pt on Bipap until this AM  5%AA/15%Dextrose WITHOUT Electrolytes infusing at rate of 40 ml/hr via PICC  Pt has not eaten anything today, unable to eat yesterday due to respiratory status. Sarah RN plans to encourage po intake as tolerated while off BiPap today  Diet Order:  DIET - DYS 1 Room service appropriate?: Yes with Assist; Fluid consistency:: Nectar Thick TPN (CLINIMIX) Adult without lytes TPN (CLINIMIX) Adult without lytes  Skin:  Reviewed, no issues  Last BM:  04/14/16   Labs: sodium 145, potassium 3.8, phosphorus 4.4, magnesium 1.7  Meds: ss novolog (required no coverage in  past 24 hours since initiation of TPN)  Height:   Ht Readings from Last 1 Encounters:  04/08/16 5\' 5"  (1.651 m)    Weight:   Wt Readings from Last 1 Encounters:  04/15/16 366 lb (166.017 kg)    Ideal Body Weight:  56.8 kg  BMI:  Body mass index is 60.91 kg/(m^2).  Estimated Nutritional Needs:   Kcal:  [id number] kcals   Protein:  >/= 114 (2.0 g/kg IBW)   Fluid:  1000 mL plus UOP  EDUCATION NEEDS:   No education needs identified at this time  [place], [place], [place] [phone] Pager  [phone] Weekend/On-Call Pager

## 2016-04-15 NOTE — Progress Notes (Signed)
Noted that pt's HGB is 6.8 this AM. MD made aware. Patient's VSS, in no distress, denies CP, SOB, and lightheadedness/dizziness.

## 2016-04-15 NOTE — Progress Notes (Signed)
PARENTERAL NUTRITION CONSULT NOTE - INITIAL  Pharmacy Consult for TPN Electrolyte and Glucose Monitoring   Allergies  Allergen Reactions  . Codeine Hives  . Sulfa Antibiotics Hives    Patient Measurements: Height: 5\' 5"  (165.1 cm) Weight: (!) 366 lb (166.017 kg) IBW/kg (Calculated) : 57  Vital Signs: Temp: 98.2 F (36.8 C) (05/05 0743) Temp Source: Axillary (05/05 0743) BP: 131/44 mmHg (05/05 0700) Pulse Rate: 59 (05/05 0700) Intake/Output from previous day: 05/04 0701 - 05/05 0700 In: 635.8 [I.V.:275.8; TPN:360] Out: 360 [Urine:235; Stool:125] Intake/Output from this shift:    Labs:  Recent Labs  04/15/16 0535  WBC 5.9  HGB 6.8*  HCT 21.7*  PLT 188     Recent Labs  04/13/16 0918 04/14/16 1650 04/15/16 0535  NA 145 142 145  K 4.1 3.9 3.8  CL 107 105 106  CO2 24 29 30   GLUCOSE 89 104* 118*  BUN 79* 46* 50*  CREATININE 3.77* 2.70* 2.93*  CALCIUM 8.6* 8.3* 8.6*  MG  --  1.7 1.7  PHOS 5.9* 4.2 4.4  PROT  --  6.2* 6.3*  ALBUMIN 2.2* 2.1* 2.1*  AST  --  15 14*  ALT  --  9* 9*  ALKPHOS  --  28* 25*  BILITOT  --  0.8 0.6  TRIG  --   --  62   Estimated Creatinine Clearance: 33.2 mL/min (by C-G formula based on Cr of 2.93).    Recent Labs  04/14/16 1936 04/15/16 0009 04/15/16 0756  GLUCAP 92 98 100*    Medical History: History reviewed. No pertinent past medical history.  Medications:  Scheduled:  . sodium chloride   Intravenous Once  . budesonide (PULMICORT) nebulizer solution  0.25 mg Nebulization 4 times per day  . heparin subcutaneous  5,000 Units Subcutaneous Q8H  . insulin aspart  0-9 Units Subcutaneous Q4H  . ipratropium-albuterol  3 mL Nebulization Q6H  . levothyroxine  37.5 mcg Intravenous Daily  . pantoprazole (PROTONIX) IV  40 mg Intravenous Q24H   Infusions:  . dexmedetomidine 0.4 mcg/kg/hr (04/15/16 0725)  . TPN (CLINIMIX) Adult without lytes 40 mL/hr at 04/14/16 2020  . TPN (CLINIMIX) Adult without lytes      Insulin  Requirements in the past 24 hours:    Current Nutrition:  Clinimix 5/15 without electrolytes  Assessment: Pharmacy consulted to assist in monitoring electrolytes and glucose in this 60 y/o F on continuous BiPap unable to take PO. Patient on HD prn.   5/4 @ 16:50 :   K = 3.9                      Mag = 1.7                      Phos = 4.2                         Ca = 8.3 ,  Alb = 2.1 , Corrected Ca = 9.82  5/5 @ 0535 K=3.8         Mg=1.7         Phos=4.4              Plan:  Patient currently on SSI. Will f/u BS. Will order labs and replace electrolytes as needed. Will need to replace conservatively in this patient on dialysis.   5/4:  All electrolytes are WNL .   No further supplementation needed.  Will recheck electrolytes on 5/5 with AM labs.  5/5 Electorlytes are WNL. No supplementation needed. 0 units of SSI used in 24 hours. Blood sugars within acceptable range.  Ramond Dial, Pharm.D Clinical Pharmacist   04/15/2016,11:51 AM

## 2016-04-15 NOTE — Progress Notes (Signed)
Patient transferring to [facility] per family request. Patient is alert and oriented to self and place with intermittent confusion. 1 unit of blood has been started and VS taken per protocol. They are currently stable. [facility] is here transferring patient from bed to their stretcher. They have received report and performed a physical assessment on patient. IV TPN has been suspended per MD order so that she can receive her unit of blood. [facility] has transferred all continuous transfusions to their pump system. Family is aware that patient is transferring and they are on the way to follow the truck to [place]. Report has been called to receiving nurse, Lanelle Bal.

## 2016-04-15 NOTE — Discharge Summary (Signed)
DISCHARGE SUMMARY  Name:Tasha Long  [id number] DOB:1956/01/16   ADMISSION DATE: 03/25/2016     PT PROFILE:  62 F in chronically very poor health developed abdominal pain and EMS was dispatched. Suffered respiratory arrest and brief cardiac arrest in the transfer from her bed to wheelchair and into ambulance. Intubated and transported to ED. Suffered brief PEA arrest again in ICU shortly after arrival Since admission, patient has been extubated, but tenuous resp status on continious biPAP    Patient placed on BiPAP for worsening resp distress,in resp distress at this time. patient attempted to take off biPAP, but increased WOB, remains full code Started TPN for nutritional support   MAJOR EVENTS/TEST RESULTS:Hospital Course 04/14 TTE: 0000000, G1 diastrolic dysfcn, mild LA dil, mod RV dil, mild RA Dil. 4 chamber dilation, with mod sys dysfcn and mild diastolic dysfcn w/diffuse hypokinesis 04/14 LE venous US: poor quality study due to body habitus. No DVT seen 04/14 abdomen US: IMPRESSION: 1. No explanation for abdominal pain. Bowel gas obscures the lower abdominal aorta. Cholecystectomy. ECHO: EF 45%, diffuse hypokinesis, grade 1 diastolic dysfunction [id number] CT chest (wihtout contrast): Severe multilobar bronchopneumonia throughout the lungs bilaterally. Multiple borderline enlarged and minimally enlarged mediastinal and bilateral hilar lymph nodes, presumably reactive. Mild dilatation of the pulmonic trunk (3.5 cm in diameter), suggestive of pulmonary arterial hypertension 04/15 CTAP: No acute pathology 04/16 Renal Consultation 04/17 CT head: Minimal diffuse cortical atrophy. Mild chronic ischemic white matter disease. Mild bilateral maxillary and ethmoid sinusitis. No acute intracranial abnormality seen 04/18 EEG: EEG secondary to general background slowing. This finding may be seen with a diffuse disturbance that is etiologically nonspecific, but  may include a metabolic encephalopathy, among other possibilities. No epileptiform activity was noted 04/19 Neurology consultation: Prognosis for neurological recovery deemed guarded,MRSA pneumonia s/p abx therapy 04/21 intermittent HD initiated 04/26 received one unit PRBCs for Hgb 6.9 04/27 ENT consultation for planned trach tube placement 04/30 Dramatic improvement in neurologic function. RASS 0. + F/C. Passed SBT and extubated. Tolerated initially 5/1 on biPAP, wean as tolerated, high risk for intubation 5/3 back on biPAP dialysis Removed 2.5 L 5/4 back on biPAP,  5/5 remains on BIPAP, placed back on transfer list to [facility]:: ETT 04/14 >> 04/30 R [place] CVL 04/14 >>  L IJ HD cath 04/17 >>5/2 PiCC line 5/2>>   MICRO DATA: MRSA PCR 04/14 >> POS Urine 04/14 >> ESBL E coli (sens only to imipenem, pip-tazo, gent) Resp 04/14 >> few gram negative rods  Blood 04/14 >> NEG  Resp 04/19>> MRSA  PCT algorithm 04/14-04/16: < 0.10, 1.48, 1.59 PCT 04/30: 0.36  ANTIMICROBIALS:  Zosyn 4/14 >> 4/14 Cefepime 4/14 >> 4/17 Metronidazole 4/14 >> 4/17 Meropenem 4/17 >> 04/29 vancomycin 4/14 >> 04/29   VITAL SIGNS: Temp: [97.8 F (36.6 C)-98.8 F (37.1 C)] 98.4 F (36.9 C) (05/05 0500) Pulse Rate: [51-67] 55 (05/05 0600) Resp: [19-35] 21 (05/05 0600) BP: (111-161)/(41-132) 137/61 mmHg (05/05 0600) SpO2: [92 %-100 %] 98 % (05/05 0600) FiO2 (%): [35 %] 35 % (05/05 0600)    VENTILATOR SETTINGS: Vent Mode: [-]  FiO2 (%): [35 %] 35 % INTAKE / OUTPUT:  Intake/Output Summary (Last 24 hours) at 04/15/16 0728 Last data filed at 04/15/16 0600  Gross per 24 hour  Intake 635.77 ml  Output  360 ml  Net 275.77 ml       LABS:  CBC  Last Labs      Recent Labs Lab 04/11/16 0427 04/12/16  0446 04/15/16 0535  WBC 6.6 6.5 5.9  HGB 7.3* 7.2* 6.8*  HCT 23.6* 23.4* 21.7*  PLT 231 238 188     Coag's  Last Labs     No  results for input(s): APTT, INR in the last 168 hours.   BMET  Last Labs      Recent Labs Lab 04/13/16 0918 04/14/16 1650 04/15/16 0535  NA 145 142 145  K 4.1 3.9 3.8  CL 107 105 106  CO2 24 29 30   BUN 79* 46* 50*  CREATININE 3.77* 2.70* 2.93*  GLUCOSE 89 104* 118*     Electrolytes  Last Labs      Recent Labs Lab 04/13/16 0918 04/14/16 1650 04/15/16 0535  CALCIUM 8.6* 8.3* 8.6*  MG --  1.7 1.7  PHOS 5.9* 4.2 4.4     Sepsis Markers  Last Labs      Recent Labs Lab 04/10/16 0426  PROCALCITON 0.36     ABG  Last Labs      Recent Labs Lab 04/09/16 0350 04/14/16 1130  PHART 7.48* 7.40  PCO2ART 43 54*  PO2ART 144* 86     Liver Enzymes  Last Labs      Recent Labs Lab 04/10/16 0426  04/13/16 0918 04/14/16 1650 04/15/16 0535  AST 19 --  --  15 14*  ALT 12* --  --  9* 9*  ALKPHOS 34* --  --  28* 25*  BILITOT 0.4 --  --  0.8 0.6  ALBUMIN 2.1* < > 2.2* 2.1* 2.1*  < > = values in this interval not displayed.   Cardiac Enzymes  Last Labs      Recent Labs Lab 04/14/16 1150 04/14/16 1650 04/14/16 1750  TROPONINI 0.04* 0.04* 0.04*     Glucose  Last Labs      Recent Labs Lab 04/14/16 0333 04/14/16 0732 04/14/16 1155 04/14/16 1610 04/14/16 1936 04/15/16 0009  GLUCAP 91 79 79 85 92 98      CXR: [place]   ASSESSMENT  27 morbidly obese white female with multiorgan dysfunction, with CHF, renal failure and OSA/OHS She will need noninvasive ventilation with BiPAP in order to live and survive and she will die without it Her quality of life is poor  Patient has chronic resp acidosis with pco2 of 52 with near normal pH and she will need oxygen therapy continious    Corrin Parker, M.D.  [facility]  Medical Director [place] Director [facility] Cardio-Pulmonary  Department

## 2016-04-15 NOTE — Progress Notes (Signed)
[facility]  ROUNDING NOTE   Subjective:  Patient on BiPAP this a.m. Patient remains oliguric at this time. Hemodialysis plan for today.    Objective:  Vital signs in last 24 hours:  Temp:  [97.8 F (36.6 C)-98.8 F (37.1 C)] 98.2 F (36.8 C) (05/05 0743) Pulse Rate:  [51-67] 59 (05/05 0700) Resp:  [19-35] 19 (05/05 0700) BP: (111-161)/(41-132) 131/44 mmHg (05/05 0700) SpO2:  [81 %-100 %] 81 % (05/05 0700) FiO2 (%):  [35 %] 35 % (05/05 0600) Weight:  [166.017 kg (366 lb)] 166.017 kg (366 lb) (05/05 0740)  Weight change:  Filed Weights   04/13/16 0356 04/14/16 0402 04/15/16 0740  Weight: 180.9 kg (398 lb 13 oz) 169.645 kg (374 lb) 166.017 kg (366 lb)    Intake/Output: I/O last 3 completed shifts: In: 752.9 [I.V.:392.9] Out: 2760 [Urine:235; Other:2400; Stool:125]   Intake/Output this shift:     Physical Exam: General: Critically ill appearing  Head: /AT hard of hearing OM moist  Eyes: anicteric  Neck: trachea midline  Lungs:  Coarse breath sounds, currently on bipap  Heart: Regular rate and rhythm  Abdomen:  Soft, nontender, obese, no bowel sounds  Extremities: + peripheral edema.  Neurologic: Awake, alert, will follow commands  Skin: Erythema bilateral lower extremities  Access: Left IJ vascath 4/17 Dr. Mortimer Fries    Basic Metabolic Panel:  Recent Labs Lab 04/11/16 0427 04/12/16 0446 04/13/16 0918 04/14/16 1650 04/15/16 0535  NA 141 143 145 142 145  K 4.0 4.0 4.1 3.9 3.8  CL 103 105 107 105 106  CO2 27 30 24 29 30   GLUCOSE 108* 89 89 104* 118*  BUN 73* 77* 79* 46* 50*  CREATININE 3.19* 3.48* 3.77* 2.70* 2.93*  CALCIUM 8.9 9.0 8.6* 8.3* 8.6*  MG  --   --   --  1.7 1.7  PHOS  --  5.8* 5.9* 4.2 4.4    Liver Function Tests:  Recent Labs Lab 04/10/16 0426 04/12/16 0446 04/13/16 0918 04/14/16 1650 04/15/16 0535  AST 19  --   --  15 14*  ALT 12*  --   --  9* 9*  ALKPHOS 34*  --   --  28* 25*  BILITOT 0.4  --   --  0.8 0.6  PROT  6.5  --   --  6.2* 6.3*  ALBUMIN 2.1* 2.2* 2.2* 2.1* 2.1*   No results for input(s): LIPASE, AMYLASE in the last 168 hours. No results for input(s): AMMONIA in the last 168 hours.  CBC:  Recent Labs Lab 04/09/16 0307 04/10/16 0435 04/11/16 0427 04/12/16 0446 04/15/16 0535  WBC 8.5 7.5 6.6 6.5 5.9  HGB 7.9* 7.6* 7.3* 7.2* 6.8*  HCT 24.5* 24.6* 23.6* 23.4* 21.7*  MCV 76.5* 77.0* 74.8* 75.1* 75.6*  PLT 225 224 231 238 188    Cardiac Enzymes:  Recent Labs Lab 04/14/16 1150 04/14/16 1650 04/14/16 1750  TROPONINI 0.04* 0.04* 0.04*    BNP: Invalid input(s): POCBNP  CBG:  Recent Labs Lab 04/14/16 1155 04/14/16 1610 04/14/16 1936 04/15/16 0009 04/15/16 0756  GLUCAP 79 30 92 74 100*    Microbiology: Results for orders placed or performed during the hospital encounter of 03/25/16  Culture, blood (routine x 2)     Status: None   Collection Time: 03/25/16  7:19 AM  Result Value Ref Range Status   Specimen Description BLOOD LEFT FOREARM  Final   Special Requests BOTTLES DRAWN AEROBIC AND ANAEROBIC  1CC  Final   Culture  NO GROWTH 5 DAYS  Final   Report Status 03/30/2016 FINAL  Final  Culture, blood (routine x 2)     Status: None   Collection Time: 03/25/16  7:19 AM  Result Value Ref Range Status   Specimen Description BLOOD LEFT HAND  Final   Special Requests BOTTLES DRAWN AEROBIC AND ANAEROBIC  1CC  Final   Culture NO GROWTH 5 DAYS  Final   Report Status 03/30/2016 FINAL  Final  MRSA PCR Screening     Status: Abnormal   Collection Time: 03/25/16 12:42 PM  Result Value Ref Range Status   MRSA by PCR POSITIVE (A) NEGATIVE Final    Comment:        The GeneXpert MRSA Assay (FDA approved for NASAL specimens only), is one component of a comprehensive MRSA colonization surveillance program. It is not intended to diagnose MRSA infection nor to guide or monitor treatment for MRSA infections. CRITICAL RESULT CALLED TO, READ BACK BY AND VERIFIED WITH:  AMELIA  BERRY AT 1644 03/25/16 SDR   Urine culture     Status: Abnormal   Collection Time: 03/26/16  1:02 AM  Result Value Ref Range Status   Specimen Description URINE, RANDOM  Final   Special Requests NONE  Final   Culture (A)  Final    20,000 COLONIES/mL ESCHERICHIA COLI Results Called to: AMELIA BERRY, RN  AT 1055 ON 03/28/16 BY CTJ. ESBL-EXTENDED SPECTRUM BETA LACTAMASE-THE ORGANISM IS RESISTANT TO PENICILLINS, CEPHALOSPORINS AND AZTREONAM ACCORDING TO CLSI M100-S15 VOL.[place].    Report Status 03/28/2016 FINAL  Final   Organism ID, Bacteria ESCHERICHIA COLI (A)  Final      Susceptibility   Escherichia coli - MIC*    AMPICILLIN >=32 RESISTANT Resistant     CEFAZOLIN >=64 RESISTANT Resistant     CEFTRIAXONE >=64 RESISTANT Resistant     CIPROFLOXACIN >=4 RESISTANT Resistant     GENTAMICIN <=1 SENSITIVE Sensitive     IMIPENEM <=0.25 SENSITIVE Sensitive     NITROFURANTOIN <=16 SENSITIVE Sensitive     TRIMETH/SULFA >=320 RESISTANT Resistant     AMPICILLIN/SULBACTAM 16 INTERMEDIATE Intermediate     PIP/TAZO <=4 SENSITIVE Sensitive     Extended ESBL POSITIVE Resistant     * 20,000 COLONIES/mL ESCHERICHIA COLI  Culture, respiratory (tracheal aspirate)     Status: None   Collection Time: 03/30/16  8:50 AM  Result Value Ref Range Status   Specimen Description TRACHEAL ASPIRATE  Final   Special Requests NONE  Final   Gram Stain FEW WBC SEEN RARE GRAM NEGATIVE RODS   Final   Culture   Final    MODERATE GROWTH METHICILLIN RESISTANT STAPHYLOCOCCUS AUREUS LIGHT GROWTH OCHROBACTRUM ANTHROPI MODERATE GROWTH GRAM NEGATIVE RODS UNABLE TO FURTHER ID BETA LACTAMASE POSITIVE    Report Status 04/05/2016 FINAL  Final   Organism ID, Bacteria METHICILLIN RESISTANT STAPHYLOCOCCUS AUREUS  Final      Susceptibility   Methicillin resistant staphylococcus aureus - MIC*    CIPROFLOXACIN <=0.5 SENSITIVE Sensitive     ERYTHROMYCIN >=8 RESISTANT Resistant     GENTAMICIN <=0.5 SENSITIVE Sensitive      OXACILLIN >=4 RESISTANT Resistant     TETRACYCLINE 2 SENSITIVE Sensitive     VANCOMYCIN 1 SENSITIVE Sensitive     TRIMETH/SULFA >=320 RESISTANT Resistant     CLINDAMYCIN >=8 RESISTANT Resistant     RIFAMPIN <=0.5 SENSITIVE Sensitive     Inducible Clindamycin NEGATIVE Sensitive     * MODERATE GROWTH METHICILLIN RESISTANT STAPHYLOCOCCUS AUREUS  Coagulation Studies: No results for input(s): LABPROT, INR in the last 72 hours.  Urinalysis: No results for input(s): COLORURINE, LABSPEC, PHURINE, GLUCOSEU, HGBUR, BILIRUBINUR, KETONESUR, PROTEINUR, UROBILINOGEN, NITRITE, LEUKOCYTESUR in the last 72 hours.  Invalid input(s): APPERANCEUR    Imaging: No results found.   Medications:   . dexmedetomidine 0.4 mcg/kg/hr (04/15/16 0725)  . TPN (CLINIMIX) Adult without lytes 40 mL/hr at 04/14/16 2020   . budesonide (PULMICORT) nebulizer solution  0.25 mg Nebulization 4 times per day  . heparin subcutaneous  5,000 Units Subcutaneous Q8H  . insulin aspart  0-9 Units Subcutaneous Q4H  . ipratropium-albuterol  3 mL Nebulization Q6H  . levothyroxine  37.5 mcg Intravenous Daily  . pantoprazole (PROTONIX) IV  40 mg Intravenous Q24H   sodium chloride, albuterol, fentaNYL (SUBLIMAZE) injection, ondansetron (ZOFRAN) IV, sodium chloride flush  Assessment/ Plan:  Ms. Tasha Long is a 60 y.o. 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   1. Acute renal failure on chronic kidney disease stage III with baseline creatinine of 1.2, eGFR of 49:   Secondary to ATN. With elevated vancomycin levels. HD was 4/22, 4/26,4/27, 4/28.  CT scan abd/pelvis negative for hydronephrosis. -Patient had hemodialysis  yesterday. Urine output has dropped. We will monitor BUN and creatinine trend. Consider dialysis again tomorrow if oliguria persists.  2. Acute respiratory failure: Pt extubated 04/10/16. - Patient on BiPAP this a.m. We will perform ultrafiltration to help improve her respiratory status.  3. Sepsis/Hypotension secondary to ESBL E. Coli Urinary tract infection and pneumonia (sputum with MRSA):  Has been weaned off of pressors and appears to be doing well at the moment.   4. Generalized edema - We have set ultrafiltration target of 1.5 kg today.   5. Morbid obesity - baseline functional status - Bed to chair    LOS: 21 Carmelo Reidel 5/5/20178:41 AM

## 2016-04-15 NOTE — Progress Notes (Signed)
PT Cancellation Note  Patient Details Name: Tasha Long MRN: [id number] DOB: March 08, 1956   Cancelled Treatment:    Reason Eval/Treat Not Completed: Other (comment). Pt currently in dialysis, unavailable for therapy at this time. Pt also with 6.8 hgb scheduled for blood transfusion this date. Will re-attempt at later date.   Jeanni Allshouse 04/15/2016, 11:42 AM Greggory Stallion, PT, DPT [phone]

## 2016-04-16 LAB — TYPE AND SCREEN
ABO/RH(D): A POS
ANTIBODY SCREEN: NEGATIVE
UNIT DIVISION: 0

## 2016-04-20 LAB — BLOOD GAS, ARTERIAL
ACID-BASE EXCESS: 3.2 mmol/L — AB (ref 0.0–3.0)
Allens test (pass/fail): POSITIVE — AB
BICARBONATE: 30.2 meq/L — AB (ref 21.0–28.0)
FIO2: 0.4
O2 SAT: 98.2 %
PCO2 ART: 56 mmHg — AB (ref 32.0–48.0)
PEEP: 5 cmH2O
PH ART: 7.34 — AB (ref 7.350–7.450)
Patient temperature: 37
RATE: 28 resp/min
VT: 500 mL
pO2, Arterial: 114 mmHg — ABNORMAL HIGH (ref 83.0–108.0)

## 2016-05-06 ENCOUNTER — Ambulatory Visit
Admission: RE | Admit: 2016-05-06 | Discharge: 2016-05-06 | Disposition: A | Discharge status: Home / Self Care | Payer: Medicaid Other | Source: Ambulatory Visit | Attending: Family Medicine | Admitting: Family Medicine

## 2016-05-06 ENCOUNTER — Other Ambulatory Visit: Payer: Self-pay | Admitting: Family Medicine

## 2016-05-06 DIAGNOSIS — I5032 Chronic diastolic (congestive) heart failure: Secondary | ICD-10-CM

## 2018-04-24 ENCOUNTER — Other Ambulatory Visit: Payer: Self-pay | Admitting: Family Medicine

## 2018-04-24 DIAGNOSIS — Z1231 Encounter for screening mammogram for malignant neoplasm of breast: Secondary | ICD-10-CM

## 2018-05-10 ENCOUNTER — Ambulatory Visit
Admission: RE | Admit: 2018-05-10 | Discharge: 2018-05-10 | Disposition: A | Discharge status: Home / Self Care | Payer: Medicaid Other | Source: Ambulatory Visit | Attending: Family Medicine | Admitting: Family Medicine

## 2018-05-10 DIAGNOSIS — Z1231 Encounter for screening mammogram for malignant neoplasm of breast: Secondary | ICD-10-CM | POA: Diagnosis present

## 2020-05-07 ENCOUNTER — Other Ambulatory Visit: Payer: Self-pay | Admitting: Family Medicine

## 2020-05-07 DIAGNOSIS — N6320 Unspecified lump in the left breast, unspecified quadrant: Secondary | ICD-10-CM

## 2020-05-29 ENCOUNTER — Ambulatory Visit
Admission: EM | Admit: 2020-05-29 | Discharge: 2020-05-29 | Disposition: A | Discharge status: Home / Self Care | Payer: Medicaid Other

## 2020-05-29 ENCOUNTER — Other Ambulatory Visit: Payer: Self-pay

## 2020-05-29 DIAGNOSIS — J441 Chronic obstructive pulmonary disease with (acute) exacerbation: Secondary | ICD-10-CM

## 2020-05-29 HISTORY — DX: Chronic obstructive pulmonary disease, unspecified: J44.9

## 2020-05-29 HISTORY — DX: Unspecified atrial fibrillation: I48.91

## 2020-05-29 MED ORDER — PREDNISONE 50 MG PO TABS: 50.0000 mg | ORAL_TABLET | Freq: Every day | ORAL | 0 refills | Status: AC

## 2020-05-29 MED ORDER — DOXYCYCLINE HYCLATE 100 MG PO TABS: 100.0000 mg | ORAL_TABLET | Freq: Two times a day (BID) | ORAL | 0 refills | Status: DC

## 2020-05-29 NOTE — Discharge Instructions (Signed)
Return if any problems.

## 2020-05-29 NOTE — ED Triage Notes (Signed)
Patient complains of COPD exacerbation that started yesterday. Patient states that she is coughing up thick green mucus. Patient states that she was hospitalized last month for this for 7 days.

## 2020-05-31 NOTE — ED Provider Notes (Signed)
[facility]    CSN: [id number] Arrival date & time: 05/29/20  1100      History   Chief Complaint Chief Complaint  Patient presents with  . COPD    HPI Tasha Long is a 64 y.o. female.   The history is provided by the patient. No language interpreter was used.  COPD This is a new problem. The current episode started yesterday. The problem occurs constantly. The problem has been gradually worsening. Associated symptoms include shortness of breath. Nothing aggravates the symptoms. Nothing relieves the symptoms.  Pt complains of an exacerbation of COPD.  Pt reports she normal gets antibiotics and prednisone when COPD flares up.    Past Medical History:  Diagnosis Date  . Atrial fibrillation ([place])   . COPD (chronic obstructive pulmonary disease) [facility])     Patient Active Problem List   Diagnosis Date Noted  . PEA (Pulseless electrical activity) ([place])   . Community acquired pneumonia   . Respiratory failure ([place])   . Bilateral leg edema   . Sepsis ([place])   . AKI (acute kidney injury) ([place])   . Altered mental status   . Respiratory arrest ([place]) 03/25/2016  . Cardiopulmonary arrest with successful resuscitation ([place])   . HYPOTHYROIDISM 11/28/2007  . OBESITY, MORBID 11/28/2007  . ASTHMA 11/28/2007  . DYSPNEA ON EXERTION 11/28/2007    History reviewed. No pertinent surgical history.  OB History   No obstetric history on file.      Home Medications    Prior to Admission medications   Medication Sig Start Date End Date Taking? Authorizing Provider  albuterol (PROVENTIL HFA;VENTOLIN HFA) 108 (90 Base) MCG/ACT inhaler Inhale 2 puffs into the lungs every 4 (four) hours as needed for wheezing or shortness of breath.   Yes [provider]  arformoterol (BROVANA) 15 MCG/2ML NEBU USE 1 VAIL VIA NABULIZER EVERY 12 HOURS 05/01/19  Yes [provider]  budesonide (PULMICORT) 0.5 MG/2ML nebulizer solution USE 1 VAIL VIA NABULIZER TWICE  DAILY 05/18/20  Yes [provider]  busPIRone (BUSPAR) 30 MG tablet Take 30 mg by mouth 2 (two) times daily.   Yes [provider]  citalopram (CELEXA) 40 MG tablet Take 40 mg by mouth daily.   Yes [provider]  ipratropium-albuterol (DUONEB) 0.5-2.5 (3) MG/3ML SOLN Take 3 mLs by nebulization 4 (four) times daily as needed.   Yes [provider]  levothyroxine (SYNTHROID, LEVOTHROID) 75 MCG tablet Take 75 mcg by mouth daily before breakfast.   Yes [provider]  montelukast (SINGULAIR) 10 MG tablet Take by mouth. 03/13/20  Yes [provider]  omeprazole (PRILOSEC) 20 MG capsule Take 20 mg by mouth daily.   Yes [provider]  pramipexole (MIRAPEX) 0.125 MG tablet Take by mouth. 04/30/20 04/30/21 Yes [provider]  tiotropium (SPIRIVA HANDIHALER) 18 MCG inhalation capsule Place into inhaler and inhale. 03/13/20  Yes [provider]  warfarin (COUMADIN) 5 MG tablet Take by mouth. 01/22/20 01/21/21 Yes [provider]  budesonide-formoterol (SYMBICORT) 160-4.5 MCG/ACT inhaler Inhale 2 puffs into the lungs 2 (two) times daily.    [provider]  doxycycline (VIBRA-TABS) 100 MG tablet Take 1 tablet (100 mg total) by mouth 2 (two) times daily. 05/29/20   Fransico Meadow, PA-C  predniSONE (DELTASONE) 50 MG tablet Take 1 tablet (50 mg total) by mouth daily. 05/29/20   Fransico Meadow, PA-C    Family History History reviewed. No pertinent family history.  Social History  Social History   Tobacco Use  . Smoking status: Former Research scientist (life sciences)  . Smokeless tobacco: Never Used  Vaping Use  . Vaping Use: Never used  Substance Use Topics  . Alcohol use: Never    Alcohol/week: 0.0 standard drinks  . Drug use: Never     Allergies   Codeine and Sulfa antibiotics   Review of Systems Review of Systems  Respiratory: Positive for shortness of breath.   All other systems reviewed and are negative.    Physical  Exam Triage Vital Signs ED Triage Vitals  Enc Vitals Group     BP 05/29/20 1114 (!) 146/67     Pulse Rate 05/29/20 1114 79     Resp 05/29/20 1114 18     Temp 05/29/20 1114 97.8 F (36.6 C)     Temp Source 05/29/20 1114 Oral     SpO2 05/29/20 1114 98 %     Weight 05/29/20 1110 (!) 365 lb 15.4 oz (166 kg)     Height 05/29/20 1110 5\' 5"  (1.651 m)     Head Circumference --      Peak Flow --      Pain Score 05/29/20 1110 0     Pain Loc --      Pain Edu? --      Excl. in [place]? --    No data found.  Updated Vital Signs BP (!) 146/67 (BP Location: Left Arm)   Pulse 79   Temp 97.8 F (36.6 C) (Oral)   Resp 18   Ht 5\' 5"  (1.651 m)   Wt (!) 166 kg   SpO2 98%   BMI 60.90 kg/m   Visual Acuity Right Eye Distance:   Left Eye Distance:   Bilateral Distance:    Right Eye Near:   Left Eye Near:    Bilateral Near:     Physical Exam Vitals and nursing note reviewed.  Constitutional:      Appearance: She is well-developed.  HENT:     Head: Normocephalic.     Nose: Nose normal.     Mouth/Throat:     Mouth: Mucous membranes are moist.  Cardiovascular:     Rate and Rhythm: Normal rate.  Pulmonary:     Effort: Pulmonary effort is normal.  Abdominal:     General: There is no distension.  Musculoskeletal:        General: Normal range of motion.     Cervical back: Normal range of motion.  Skin:    General: Skin is warm.  Neurological:     General: No focal deficit present.     Mental Status: She is alert and oriented to person, place, and time.  Psychiatric:        Mood and Affect: Mood normal.      UC Treatments / Results  Labs (all labs ordered are listed, but only abnormal results are displayed) Labs Reviewed - No data to display  EKG   Radiology No results found.  Procedures Procedures (including critical care time)  Medications Ordered in UC Medications - No data to display  Initial Impression / Assessment and Plan / UC Course  I have reviewed the  triage vital signs and the nursing notes.  Pertinent labs & imaging results that were available during my care of the patient were reviewed by me and considered in my medical decision making (see chart for details).   MDM:  Pt given rx for doxycycline and prednisone.  I advised follow up with primary care  for recheck    Final Clinical Impressions(s) / UC Diagnoses   Final diagnoses:  COPD exacerbation [facility])     Discharge Instructions     Return if any problems.    ED Prescriptions    Medication Sig Dispense Auth. Provider   doxycycline (VIBRA-TABS) 100 MG tablet Take 1 tablet (100 mg total) by mouth 2 (two) times daily. 20 tablet Ranell Finelli K, PA-C   predniSONE (DELTASONE) 50 MG tablet Take 1 tablet (50 mg total) by mouth daily. 6 tablet Fransico Meadow, [place]     PDMP not reviewed this encounter.   Fransico Meadow, [place] 05/31/20 1807

## 2020-08-25 ENCOUNTER — Other Ambulatory Visit: Payer: Self-pay

## 2020-08-25 ENCOUNTER — Emergency Department: Payer: Medicaid Other

## 2020-08-25 DIAGNOSIS — G931 Anoxic brain damage, not elsewhere classified: Secondary | ICD-10-CM | POA: Diagnosis present

## 2020-08-25 DIAGNOSIS — N184 Chronic kidney disease, stage 4 (severe): Secondary | ICD-10-CM | POA: Diagnosis present

## 2020-08-25 DIAGNOSIS — Z7901 Long term (current) use of anticoagulants: Secondary | ICD-10-CM | POA: Diagnosis not present

## 2020-08-25 DIAGNOSIS — G92 Toxic encephalopathy: Secondary | ICD-10-CM | POA: Diagnosis present

## 2020-08-25 DIAGNOSIS — J9601 Acute respiratory failure with hypoxia: Secondary | ICD-10-CM | POA: Diagnosis present

## 2020-08-25 DIAGNOSIS — Z6841 Body Mass Index (BMI) 40.0 and over, adult: Secondary | ICD-10-CM

## 2020-08-25 DIAGNOSIS — D649 Anemia, unspecified: Secondary | ICD-10-CM

## 2020-08-25 DIAGNOSIS — L03115 Cellulitis of right lower limb: Secondary | ICD-10-CM | POA: Diagnosis present

## 2020-08-25 DIAGNOSIS — L97829 Non-pressure chronic ulcer of other part of left lower leg with unspecified severity: Secondary | ICD-10-CM | POA: Diagnosis present

## 2020-08-25 DIAGNOSIS — Z20822 Contact with and (suspected) exposure to covid-19: Secondary | ICD-10-CM | POA: Diagnosis present

## 2020-08-25 DIAGNOSIS — K922 Gastrointestinal hemorrhage, unspecified: Secondary | ICD-10-CM | POA: Diagnosis present

## 2020-08-25 DIAGNOSIS — R4020 Unspecified coma: Secondary | ICD-10-CM | POA: Diagnosis present

## 2020-08-25 DIAGNOSIS — L03116 Cellulitis of left lower limb: Secondary | ICD-10-CM | POA: Diagnosis present

## 2020-08-25 DIAGNOSIS — Z0189 Encounter for other specified special examinations: Secondary | ICD-10-CM

## 2020-08-25 DIAGNOSIS — J9602 Acute respiratory failure with hypercapnia: Secondary | ICD-10-CM | POA: Diagnosis present

## 2020-08-25 DIAGNOSIS — I129 Hypertensive chronic kidney disease with stage 1 through stage 4 chronic kidney disease, or unspecified chronic kidney disease: Secondary | ICD-10-CM | POA: Diagnosis present

## 2020-08-25 DIAGNOSIS — Z7401 Bed confinement status: Secondary | ICD-10-CM

## 2020-08-25 DIAGNOSIS — E039 Hypothyroidism, unspecified: Secondary | ICD-10-CM | POA: Diagnosis present

## 2020-08-25 DIAGNOSIS — D62 Acute posthemorrhagic anemia: Secondary | ICD-10-CM | POA: Diagnosis present

## 2020-08-25 DIAGNOSIS — Z452 Encounter for adjustment and management of vascular access device: Secondary | ICD-10-CM

## 2020-08-25 DIAGNOSIS — Z7951 Long term (current) use of inhaled steroids: Secondary | ICD-10-CM

## 2020-08-25 DIAGNOSIS — R570 Cardiogenic shock: Secondary | ICD-10-CM | POA: Diagnosis present

## 2020-08-25 DIAGNOSIS — Z79890 Hormone replacement therapy (postmenopausal): Secondary | ICD-10-CM

## 2020-08-25 DIAGNOSIS — I214 Non-ST elevation (NSTEMI) myocardial infarction: Secondary | ICD-10-CM | POA: Diagnosis not present

## 2020-08-25 DIAGNOSIS — I4891 Unspecified atrial fibrillation: Secondary | ICD-10-CM | POA: Diagnosis present

## 2020-08-25 DIAGNOSIS — Z7189 Other specified counseling: Secondary | ICD-10-CM

## 2020-08-25 DIAGNOSIS — Z66 Do not resuscitate: Secondary | ICD-10-CM | POA: Diagnosis not present

## 2020-08-25 DIAGNOSIS — R600 Localized edema: Secondary | ICD-10-CM | POA: Diagnosis present

## 2020-08-25 DIAGNOSIS — F329 Major depressive disorder, single episode, unspecified: Secondary | ICD-10-CM | POA: Diagnosis present

## 2020-08-25 DIAGNOSIS — Z515 Encounter for palliative care: Secondary | ICD-10-CM | POA: Diagnosis not present

## 2020-08-25 DIAGNOSIS — E875 Hyperkalemia: Secondary | ICD-10-CM | POA: Diagnosis present

## 2020-08-25 DIAGNOSIS — J9691 Respiratory failure, unspecified with hypoxia: Secondary | ICD-10-CM

## 2020-08-25 DIAGNOSIS — I472 Ventricular tachycardia: Secondary | ICD-10-CM | POA: Diagnosis present

## 2020-08-25 DIAGNOSIS — R571 Hypovolemic shock: Secondary | ICD-10-CM | POA: Diagnosis present

## 2020-08-25 DIAGNOSIS — N170 Acute kidney failure with tubular necrosis: Secondary | ICD-10-CM | POA: Diagnosis present

## 2020-08-25 DIAGNOSIS — I469 Cardiac arrest, cause unspecified: Secondary | ICD-10-CM | POA: Diagnosis not present

## 2020-08-25 DIAGNOSIS — J441 Chronic obstructive pulmonary disease with (acute) exacerbation: Secondary | ICD-10-CM | POA: Diagnosis present

## 2020-08-25 DIAGNOSIS — Z9181 History of falling: Secondary | ICD-10-CM

## 2020-08-25 DIAGNOSIS — Z79899 Other long term (current) drug therapy: Secondary | ICD-10-CM

## 2020-08-25 DIAGNOSIS — Z87891 Personal history of nicotine dependence: Secondary | ICD-10-CM

## 2020-08-25 LAB — DIFFERENTIAL
Abs Immature Granulocytes: 1.22 10*3/uL — ABNORMAL HIGH (ref 0.00–0.07)
Basophils Absolute: 0 10*3/uL (ref 0.0–0.1)
Basophils Relative: 0 %
Eosinophils Absolute: 0.2 10*3/uL (ref 0.0–0.5)
Eosinophils Relative: 2 %
Immature Granulocytes: 10 %
Lymphocytes Relative: 9 %
Lymphs Abs: 1.1 10*3/uL (ref 0.7–4.0)
Monocytes Absolute: 0.9 10*3/uL (ref 0.1–1.0)
Monocytes Relative: 8 %
Neutro Abs: 8.4 10*3/uL — ABNORMAL HIGH (ref 1.7–7.7)
Neutrophils Relative %: 71 %
Smear Review: NORMAL

## 2020-08-25 LAB — URINALYSIS, ROUTINE W REFLEX MICROSCOPIC
Bilirubin Urine: NEGATIVE
Glucose, UA: NEGATIVE mg/dL
Ketones, ur: NEGATIVE mg/dL
Nitrite: NEGATIVE
Protein, ur: >=300 mg/dL — AB
RBC / HPF: >50 RBC/hpf — ABNORMAL HIGH (ref 0–5)
Specific Gravity, Urine: 1.016 (ref 1.005–1.030)
WBC, UA: >50 WBC/hpf — ABNORMAL HIGH (ref 0–5)
pH: 5 (ref 5.0–8.0)

## 2020-08-25 LAB — BASIC METABOLIC PANEL
Anion gap: 18 — ABNORMAL HIGH (ref 5–15)
BUN: 84 mg/dL — ABNORMAL HIGH (ref 8–23)
CO2: 24 mmol/L (ref 22–32)
Calcium: 7.9 mg/dL — ABNORMAL LOW (ref 8.9–10.3)
Chloride: 91 mmol/L — ABNORMAL LOW (ref 98–111)
Creatinine, Ser: 4.64 mg/dL — ABNORMAL HIGH (ref 0.44–1.00)
GFR calc Af Amer: 11 mL/min — ABNORMAL LOW (ref >60)
GFR calc non Af Amer: 9 mL/min — ABNORMAL LOW (ref >60)
Glucose, Bld: 104 mg/dL — ABNORMAL HIGH (ref 70–99)
Potassium: 5.9 mmol/L — ABNORMAL HIGH (ref 3.5–5.1)
Sodium: 133 mmol/L — ABNORMAL LOW (ref 135–145)

## 2020-08-25 LAB — COMPREHENSIVE METABOLIC PANEL
ALT: 21 U/L (ref 0–44)
AST: 58 U/L — ABNORMAL HIGH (ref 15–41)
Albumin: 2.2 g/dL — ABNORMAL LOW (ref 3.5–5.0)
Alkaline Phosphatase: 48 U/L (ref 38–126)
Anion gap: 19 — ABNORMAL HIGH (ref 5–15)
BUN: 80 mg/dL — ABNORMAL HIGH (ref 8–23)
CO2: 21 mmol/L — ABNORMAL LOW (ref 22–32)
Calcium: 7.6 mg/dL — ABNORMAL LOW (ref 8.9–10.3)
Chloride: 93 mmol/L — ABNORMAL LOW (ref 98–111)
Creatinine, Ser: 4.17 mg/dL — ABNORMAL HIGH (ref 0.44–1.00)
GFR calc Af Amer: 12 mL/min — ABNORMAL LOW (ref >60)
GFR calc non Af Amer: 11 mL/min — ABNORMAL LOW (ref >60)
Glucose, Bld: 96 mg/dL (ref 70–99)
Potassium: 5.7 mmol/L — ABNORMAL HIGH (ref 3.5–5.1)
Sodium: 133 mmol/L — ABNORMAL LOW (ref 135–145)
Total Bilirubin: 0.9 mg/dL (ref 0.3–1.2)
Total Protein: 5.6 g/dL — ABNORMAL LOW (ref 6.5–8.1)

## 2020-08-25 LAB — GLUCOSE, CAPILLARY
Glucose-Capillary: 111 mg/dL — ABNORMAL HIGH (ref 70–99)
Glucose-Capillary: 100 mg/dL — ABNORMAL HIGH (ref 70–99)
Glucose-Capillary: 93 mg/dL (ref 70–99)
Glucose-Capillary: 95 mg/dL (ref 70–99)

## 2020-08-25 LAB — BLOOD GAS, ARTERIAL
Acid-Base Excess: 1 mmol/L (ref 0.0–2.0)
Bicarbonate: 27.1 mmol/L (ref 20.0–28.0)
FIO2: 0.4
MECHVT: 450 mL
O2 Saturation: 94 %
PEEP: 10 cmH2O
Patient temperature: 37
RATE: 16 resp/min
pCO2 arterial: 55 mmHg — ABNORMAL HIGH (ref 32.0–48.0)
pH, Arterial: 7.3 — ABNORMAL LOW (ref 7.350–7.450)
pO2, Arterial: 78 mmHg — ABNORMAL LOW (ref 83.0–108.0)

## 2020-08-25 LAB — PROTIME-INR
INR: >10 (ref 0.8–1.2)
INR: >10 (ref 0.8–1.2)
INR: 4.6 (ref 0.8–1.2)
Prothrombin Time: >90 seconds — ABNORMAL HIGH (ref 11.4–15.2)
Prothrombin Time: >90 seconds — ABNORMAL HIGH (ref 11.4–15.2)
Prothrombin Time: 41.8 seconds — ABNORMAL HIGH (ref 11.4–15.2)

## 2020-08-25 LAB — URINE DRUG SCREEN, QUALITATIVE (ARMC ONLY)
Amphetamines, Ur Screen: NOT DETECTED
Barbiturates, Ur Screen: NOT DETECTED
Benzodiazepine, Ur Scrn: NOT DETECTED
Cannabinoid 50 Ng, Ur ~~LOC~~: NOT DETECTED
Cocaine Metabolite,Ur ~~LOC~~: NOT DETECTED
MDMA (Ecstasy)Ur Screen: NOT DETECTED
Methadone Scn, Ur: NOT DETECTED
Opiate, Ur Screen: NOT DETECTED
Phencyclidine (PCP) Ur S: NOT DETECTED
Tricyclic, Ur Screen: NOT DETECTED

## 2020-08-25 LAB — CBC
HCT: 11.6 % — CL (ref 36.0–46.0)
Hemoglobin: 3.1 g/dL — CL (ref 12.0–15.0)
MCH: 17.8 pg — ABNORMAL LOW (ref 26.0–34.0)
MCHC: 26.7 g/dL — ABNORMAL LOW (ref 30.0–36.0)
MCV: 66.7 fL — ABNORMAL LOW (ref 80.0–100.0)
Platelets: 476 10*3/uL — ABNORMAL HIGH (ref 150–400)
RBC: 1.74 MIL/uL — ABNORMAL LOW (ref 3.87–5.11)
RDW: 23 % — ABNORMAL HIGH (ref 11.5–15.5)
WBC: 11.9 10*3/uL — ABNORMAL HIGH (ref 4.0–10.5)
nRBC: 22 % — ABNORMAL HIGH (ref 0.0–0.2)

## 2020-08-25 LAB — PREPARE RBC (CROSSMATCH)

## 2020-08-25 LAB — PATHOLOGIST SMEAR REVIEW

## 2020-08-25 LAB — ETHANOL: Alcohol, Ethyl (B): <10 mg/dL (ref <10)

## 2020-08-25 LAB — APTT
aPTT: 138 seconds — ABNORMAL HIGH (ref 24–36)
aPTT: 149 seconds — ABNORMAL HIGH (ref 24–36)

## 2020-08-25 LAB — MRSA PCR SCREENING: MRSA by PCR: POSITIVE — AB

## 2020-08-25 LAB — SARS CORONAVIRUS 2 BY RT PCR (HOSPITAL ORDER, PERFORMED IN ~~LOC~~ HOSPITAL LAB): SARS Coronavirus 2: NEGATIVE

## 2020-08-25 MED ORDER — POLYETHYLENE GLYCOL 3350 17 G PO PACK
17.0000 g | PACK | Freq: Every day | ORAL | Status: DC
  Administered 2020-08-26: 17 g via ORAL
  Filled 2020-08-25 (×2): qty 1

## 2020-08-25 MED ORDER — SODIUM CHLORIDE 0.9% IV SOLUTION: Freq: Once | INTRAVENOUS | Status: DC

## 2020-08-25 MED ORDER — DOCUSATE SODIUM 50 MG/5ML PO LIQD
100.0000 mg | Freq: Two times a day (BID) | ORAL | Status: DC
  Administered 2020-08-26: 100 mg via ORAL
  Filled 2020-08-25 (×2): qty 10

## 2020-08-25 MED ORDER — CEFTRIAXONE SODIUM 1 G IJ SOLR: 1.0000 g | INTRAMUSCULAR | Status: DC

## 2020-08-25 MED ORDER — CHLORHEXIDINE GLUCONATE 0.12% ORAL RINSE (MEDLINE KIT)
15.0000 mL | Freq: Two times a day (BID) | OROMUCOSAL | Status: DC
  Administered 2020-08-25 – 2020-08-26 (×2): 15 mL via OROMUCOSAL
  Filled 2020-08-25: qty 15

## 2020-08-25 MED ORDER — PROTHROMBIN COMPLEX CONC HUMAN 500 UNITS IV KIT
2170.0000 [IU] | PACK | Status: AC
  Administered 2020-08-25: 2170 [IU] via INTRAVENOUS
  Filled 2020-08-25: qty 2170

## 2020-08-25 MED ORDER — CHLORHEXIDINE GLUCONATE CLOTH 2 % EX PADS
6.0000 | MEDICATED_PAD | Freq: Every day | CUTANEOUS | Status: DC
  Administered 2020-08-25 – 2020-08-26 (×2): 6 via TOPICAL
  Filled 2020-08-25: qty 6

## 2020-08-25 MED ORDER — FENTANYL BOLUS VIA INFUSION
50.0000 ug | INTRAVENOUS | Status: DC | PRN
  Administered 2020-08-25: 50 ug via INTRAVENOUS
  Filled 2020-08-25: qty 50

## 2020-08-25 MED ORDER — DEXTROSE 5 % IV SOLN
3.0000 g | Freq: Three times a day (TID) | INTRAVENOUS | Status: DC
  Administered 2020-08-26 (×2): 3 g via INTRAVENOUS
  Filled 2020-08-25 (×4): qty 3000

## 2020-08-25 MED ORDER — SODIUM CHLORIDE 0.9 % IV SOLN: INTRAVENOUS | Status: DC

## 2020-08-25 MED ORDER — FENTANYL 2500MCG IN NS 250ML (10MCG/ML) PREMIX INFUSION
50.0000 ug/h | INTRAVENOUS | Status: DC
  Administered 2020-08-25: 50 ug/h via INTRAVENOUS
  Filled 2020-08-25: qty 250

## 2020-08-25 MED ORDER — NOREPINEPHRINE 4 MG/250ML-% IV SOLN: 0.0000 ug/min | INTRAVENOUS | Status: DC

## 2020-08-25 MED ORDER — CEFAZOLIN SODIUM-DEXTROSE 2-4 GM/100ML-% IV SOLN: 2.0000 g | Freq: Three times a day (TID) | INTRAVENOUS | Status: DC

## 2020-08-25 MED ORDER — ORAL CARE MOUTH RINSE
15.0000 mL | OROMUCOSAL | Status: DC
  Administered 2020-08-25 – 2020-08-26 (×12): 15 mL via OROMUCOSAL
  Filled 2020-08-25 (×3): qty 15

## 2020-08-25 MED ORDER — SODIUM CHLORIDE 0.9 % IV SOLN
10.0000 mL/h | Freq: Once | INTRAVENOUS | Status: AC
  Administered 2020-08-25: 10 mL/h via INTRAVENOUS

## 2020-08-25 MED ORDER — FENTANYL CITRATE (PF) 100 MCG/2ML IJ SOLN
50.0000 ug | Freq: Once | INTRAMUSCULAR | Status: DC
  Filled 2020-08-25: qty 2

## 2020-08-25 MED ORDER — AMIODARONE HCL IN DEXTROSE 360-4.14 MG/200ML-% IV SOLN: 60.0000 mg/h | INTRAVENOUS | Status: DC

## 2020-08-25 MED ORDER — NOREPINEPHRINE 4 MG/250ML-% IV SOLN
0.0000 ug/min | INTRAVENOUS | Status: DC
  Administered 2020-08-25: 5 ug/min via INTRAVENOUS
  Administered 2020-08-25: 2 ug/min via INTRAVENOUS
  Filled 2020-08-25: qty 250

## 2020-08-25 MED ORDER — VITAMIN K1 10 MG/ML IJ SOLN
10.0000 mg | INTRAVENOUS | Status: AC
  Administered 2020-08-25: 10 mg via INTRAVENOUS
  Filled 2020-08-25: qty 1

## 2020-08-25 MED ORDER — PROTHROMBIN COMPLEX CONC HUMAN 500 UNITS IV KIT
2000.0000 [IU] | PACK | Status: DC
  Filled 2020-08-25: qty 2000

## 2020-08-25 MED ORDER — AMIODARONE HCL IN DEXTROSE 360-4.14 MG/200ML-% IV SOLN
INTRAVENOUS | Status: AC
  Administered 2020-08-25: 60 mg/h via INTRAVENOUS
  Filled 2020-08-25: qty 200

## 2020-08-25 MED ORDER — MUPIROCIN 2 % EX OINT
1.0000 "application " | TOPICAL_OINTMENT | Freq: Two times a day (BID) | CUTANEOUS | Status: DC
  Administered 2020-08-25 – 2020-08-26 (×2): 1 via NASAL
  Filled 2020-08-25: qty 22

## 2020-08-25 MED ORDER — STERILE WATER FOR INJECTION IV SOLN
INTRAVENOUS | Status: DC
  Filled 2020-08-25 (×2): qty 850
  Filled 2020-08-25: qty 150
  Filled 2020-08-25: qty 850

## 2020-08-25 MED ORDER — ASPIRIN 300 MG RE SUPP: 300.0000 mg | RECTAL | Status: DC

## 2020-08-25 MED ORDER — PANTOPRAZOLE SODIUM 40 MG IV SOLR
40.0000 mg | INTRAVENOUS | Status: DC
  Administered 2020-08-25: 40 mg via INTRAVENOUS
  Filled 2020-08-25: qty 40

## 2020-08-25 NOTE — H&P (Signed)
Name: Tasha Long MRN: [id number] DOB: [birth year]     CONSULTATION DATE: 08/17/2020  REFERRING MD :Cheri Fowler  CHIEF COMPLAINT: Cardiac arrest  HISTORY OF PRESENT ILLNESS:   64 y.o. female with a past medical history of COPD and atrial fibrillation on Coumadin bedbound who presents via EMS after being found unresponsive by family members during a diaper change just prior to arrival.    CPR started by daughter approximate downtime 35 minutes EMS showed a PEA asystole rhythm then V. tach with several shocks given Several rounds of CPR were administered and return of spontaneous circulation is reported to have returned within 35 to 45 minutes  At this time patient is in multiorgan failure with significant respiratory and cardiac failure along with acute renal failure with significant bleeding from the GI tract.  The decision was made to initiate hypothermia protocol Prognosis is very poor at this time with multiorgan failure with a very high risk for cardiac arrest and death  . PAST MEDICAL HISTORY :   has a past medical history of Atrial fibrillation ([name]) and COPD (chronic obstructive pulmonary disease) ([name]).  has no past surgical history on file. Prior to Admission medications   Medication Sig Start Date End Date Taking? Authorizing Provider  albuterol (PROVENTIL HFA;VENTOLIN HFA) 108 (90 Base) MCG/ACT inhaler Inhale 2 puffs into the lungs every 4 (four) hours as needed for wheezing or shortness of breath.   Yes [provider]  amLODipine (NORVASC) 5 MG tablet Take by mouth. 05/01/20 06/24/21 Yes [provider]  busPIRone (BUSPAR) 30 MG tablet Take 30 mg by mouth 2 (two) times daily.   Yes [provider]  clotrimazole (LOTRIMIN) 1 % cream Apply topically 2 (two) times daily. 11/15/19  Yes [provider]  DULoxetine (CYMBALTA) 30 MG capsule Take 30 mg by mouth daily.  01/22/20 06/17/21 Yes [provider]  DULoxetine (CYMBALTA)  60 MG capsule Take 60 mg by mouth daily.   Yes [provider]  Fluticasone-Umeclidin-Vilant (TRELEGY ELLIPTA) 200-62.5-25 MCG/INH AEPB Inhale 1 puff into the lungs daily.  07/01/20  Yes [provider]  ipratropium-albuterol (DUONEB) 0.5-2.5 (3) MG/3ML SOLN Take 3 mLs by nebulization 4 (four) times daily as needed.   Yes [provider]  montelukast (SINGULAIR) 10 MG tablet Take by mouth. 03/13/20  Yes [provider]  omeprazole (PRILOSEC) 20 MG capsule Take 20 mg by mouth daily.   Yes [provider]  pramipexole (MIRAPEX) 0.125 MG tablet Take by mouth. 04/30/20 04/30/21 Yes [provider]  predniSONE (DELTASONE) 10 MG tablet Take 10 mg by mouth daily with breakfast. Prednisone 40 mg po day 1-2, 30 mg po day 3-4, 20 mg po 5-6, 10 mg po 7-8, 5 mg po 9-10 and stop   Yes [provider]  torsemide (DEMADEX) 100 MG tablet Take 100 mg by mouth daily.   Yes [provider]  acetaminophen (TYLENOL) 325 MG tablet Take by mouth.    [provider]  levothyroxine (SYNTHROID, LEVOTHROID) 75 MCG tablet Take 75 mcg by mouth daily before breakfast.    [provider]  predniSONE (DELTASONE) 50 MG tablet Take 1 tablet (50 mg total) by mouth daily. 05/29/20   Fransico Meadow, PA-C  warfarin (COUMADIN) 5 MG tablet Take by mouth. 01/22/20 01/21/21  [provider]   Allergies  Allergen Reactions  . Sulfasalazine Hives  . Ciprofloxacin Hcl Other (See Comments)    Other reaction(s): Other (See Comments) KIDNEY FAILURE KIDNEY FAILURE   .  Codeine Hives  . Gabapentin Other (See Comments)    Other reaction(s): Other (See Comments) confusion confusion   . Sulfa Antibiotics Hives    FAMILY HISTORY:  family history is not on file. SOCIAL HISTORY:  reports that she has quit smoking. She has never used smokeless tobacco. She reports that she does not drink alcohol and does not use drugs.  REVIEW OF SYSTEMS:   Unable  to obtain due to critical illness      Estimated body mass index is 66.55 kg/m as calculated from the following:   Height as of this encounter: 5\' 3"  (1.6 m).   Weight as of this encounter: 170.4 kg.    VITAL SIGNS: Temp:  [91.9 F (33.3 C)-95.4 F (35.2 C)] 95.4 F (35.2 C) (09/14 1730) Pulse Rate:  [58-65] 65 (09/14 1730) Resp:  [13-25] 20 (09/14 1730) BP: (90-113)/(32-46) 101/40 (09/14 1730) SpO2:  [90 %-100 %] 92 % (09/14 1730) FiO2 (%):  [35 %-40 %] 35 % (09/14 1730) Weight:  [170.4 kg-175 kg] 170.4 kg (09/14 1541)   No intake/output data recorded. Total I/O In: -  Out: 6 [Urine:6]   SpO2: 92 % FiO2 (%): 35 %   Physical Examination:  GENERAL:critically ill appearing, +resp distress HEAD: Normocephalic, atraumatic.  EYES: Pupils equal, round, reactive to light.  No scleral icterus.  MOUTH: Moist mucosal membrane. NECK: Supple. No JVD.  PULMONARY: +rhonchi, +wheezing CARDIOVASCULAR: S1 and S2. Regular rate and rhythm. No murmurs, rubs, or gallops.  GASTROINTESTINAL: Soft, nontender, -distended.  Positive bowel sounds.  MUSCULOSKELETAL: No swelling, clubbing, or edema.  NEUROLOGIC: obtunded SKIN:intact,warm,dry    MEDICATIONS: I have reviewed all medications and confirmed regimen as documented   CULTURE RESULTS   Recent Results (from the past 240 hour(s))  SARS Coronavirus 2 by RT PCR (hospital order, performed in [facility] hospital lab) Nasopharyngeal Nasopharyngeal Swab     Status: None   Collection Time: 08/14/2020 11:20 AM   Specimen: Nasopharyngeal Swab  Result Value Ref Range Status   SARS Coronavirus 2 NEGATIVE NEGATIVE Final    Comment: (NOTE) SARS-CoV-2 target nucleic acids are NOT DETECTED.  The SARS-CoV-2 RNA is generally detectable in upper and lower respiratory specimens during the acute phase of infection. The lowest concentration of SARS-CoV-2 viral copies this assay can detect is 250 copies / mL. A negative result does not preclude  SARS-CoV-2 infection and should not be used as the sole basis for treatment or other patient management decisions.  A negative result may occur with improper specimen collection / handling, submission of specimen other than nasopharyngeal swab, presence of viral mutation(s) within the areas targeted by this assay, and inadequate number of viral copies (<250 copies / mL). A negative result must be combined with clinical observations, patient history, and epidemiological information.  Fact Sheet for Patients:   StrictlyIdeas.no  Fact Sheet for Healthcare Providers: BankingDealers.co.za  This test is not yet approved or  cleared by the [place] FDA and has been authorized for detection and/or diagnosis of SARS-CoV-2 by FDA under an Emergency Use Authorization (EUA).  This EUA will remain in effect (meaning this test can be used) for the duration of the COVID-19 declaration under Section 564(b)(1) of the Act, 21 U.S.C. section 360bbb-3(b)(1), unless the authorization is terminated or revoked sooner.  Performed at [facility], [address] [place], [place] [address]   MRSA PCR Screening     Status: Abnormal   Collection Time: 08/27/2020  4:28 PM   Specimen: Nasopharyngeal  Result Value Ref Range Status   MRSA by PCR POSITIVE (A) NEGATIVE Final    Comment:        The GeneXpert MRSA Assay (FDA approved for NASAL specimens only), is one component of a comprehensive MRSA colonization surveillance program. It is not intended to diagnose MRSA infection nor to guide or monitor treatment for MRSA infections. RESULT CALLED TO, READ BACK BY AND VERIFIED WITH: [facility] MOORE 08/17/2020 AT 1735 BY ACR Performed at [facility], [place], [place] [address]           IMAGING    DG Abd 1 View  Result Date: 08/24/2020 CLINICAL DATA:  Intubation, orogastric tube placement EXAM: ABDOMEN - 1 VIEW  COMPARISON:  Portable exam 1144 hours compared to 03/28/2016 FINDINGS: Tip of nasogastric tube projects over distal gastric antrum. Patchy infiltrates at visualized lower lungs. Bones slightly demineralized. External pacing leads present. IMPRESSION: Tip of orogastric tube projects over distal gastric antrum. Electronically Signed   By: Lavonia Dana M.D.   On: 09/08/2020 12:02   CT HEAD WO CONTRAST  Result Date: 09/05/2020 CLINICAL DATA:  Neuro deficit, acute, stroke suspected. Additional history provided: Unresponsiveness, CPR, fall 2 days ago. EXAM: CT HEAD WITHOUT CONTRAST TECHNIQUE: Contiguous axial images were obtained from the base of the skull through the vertex without intravenous contrast. COMPARISON:  Head CT 03/28/2016. FINDINGS: Brain: Cerebral volume is normal for age. There is no acute intracranial hemorrhage. No demarcated cortical infarct. No extra-axial fluid collection. No evidence of intracranial mass. No midline shift. Partially empty sella turcica. Vascular: No hyperdense vessel Skull: Normal. Negative for fracture or focal lesion. Sinuses/Orbits: Visualized orbits show no acute finding. Mild ethmoid sinus mucosal thickening. Prominent secretions within the nasal passage and nasopharynx. Partially visualized support tubes. Small left mastoid effusion. IMPRESSION: No CT evidence of acute intracranial abnormality. Mild ethmoid sinus mucosal thickening. Prominent secretions within the nasal passages and nasopharynx in the presence of life-support tubes. Electronically Signed   By: Kellie Simmering DO   On: 09/08/2020 12:40   DG Chest Portable 1 View  Result Date: 09/08/2020 CLINICAL DATA:  Intubation, atrial fibrillation, COPD, former smoker EXAM: PORTABLE CHEST 1 VIEW COMPARISON:  Portable exam 1141 hours compared to 05/06/2016 FINDINGS: Tip of endotracheal tube projects 3.5 cm above carina. Nasogastric tube extends into stomach. Enlargement of cardiac silhouette. Mediastinal contours normal.  Numerous EKG leads project over chest. Patchy BILATERAL airspace infiltrates consistent with multifocal pneumonia. No pleural effusion or pneumothorax. IMPRESSION: Patchy BILATERAL airspace infiltrates consistent with multifocal pneumonia. Electronically Signed   By: Lavonia Dana M.D.   On: 08/15/2020 12:01     Nutrition Status:           Indwelling Urinary Catheter continued, requirement due to   Reason to continue Indwelling Urinary Catheter strict Intake/Output monitoring for hemodynamic instability         Ventilator continued, requirement due to severe respiratory failure   Ventilator Sedation RASS 0 to -2      ASSESSMENT AND PLAN SYNOPSIS  64 year old morbidly obese white female with multiple medical issues with acute blood loss anemia leading to acute and severe cardiac arrest and cardiac death leading to acute and severe respiratory failure with hypoxia with multiorgan failure in the setting of 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   Will certainly impact respiratory mechanics, ventilator weaning Suspect will need to consider additional PEEP   ACUTE KIDNEY INJURY/Renal Failure -continue Foley Catheter-assess need -Avoid nephrotoxic agents -Follow urine output, BMP -Ensure adequate renal perfusion, optimize oxygenation -Renal dose medications     NEUROLOGY Acute toxic metabolic encephalopathy-findings may consist be consistent with anoxic brain injury With sedatives at this time   SHOCK-HYPOVOLUMIC/CARDIOGENIC -use vasopressors to keep  MAP>65 -follow ABG and LA -follow up cultures -emperic ABX -consider stress dose steroids -aggressive IV fluid resuscitation  CARDIAC ICU monitoring  ID -continue IV abx as prescibed -follow up cultures  GI GI PROPHYLAXIS as indicated  NUTRITIONAL STATUS DIET-->NPO Constipation protocol as indicated   ENDO - will use ICU hypoglycemic\Hyperglycemia protocol if needed    ELECTROLYTES -follow labs as needed -replace as needed -pharmacy consultation and following    DVT/GI PRX ordered and assessed TRANSFUSIONS AS NEEDED MONITOR FSBS I Assessed the need for Labs I Assessed the need for Foley I Assessed the need for Central Venous Line Family Discussion when available I Assessed the need for Mobilization I made an Assessment of medications to be adjusted accordingly Safety Risk assessment Completed  CASE DISCUSSED IN MULTIDISCIPLINARY ROUNDS WITH ICU TEAM   Critical Care Time devoted to patient care services described in this note is 75 minutes.   Overall, patient is critically ill, prognosis is guarded.  Patient with Multiorgan failure and at high risk for cardiac arrest and death.    Corrin Parker, M.D.  [facility]  Medical Director [place] Director [facility] Cardio-Pulmonary Department

## 2020-08-25 NOTE — ED Notes (Signed)
This tech, Sam,RN, Louie Bun, EDT performed inc care/peri care.

## 2020-08-25 NOTE — ED Notes (Signed)
King airway removed, MD to reintubate at this time.

## 2020-08-25 NOTE — ED Notes (Signed)
Upon arrival pt was covered in black stools. Pt cleaned up, chucks placed on bed, pt placed in clean gown.

## 2020-08-25 NOTE — ED Notes (Signed)
Family at bedside. 

## 2020-08-25 NOTE — Progress Notes (Signed)
Pt transported to [place] with 2RN', NT and this RT without incident. Pt placed on Servoi Ventilator. Floor RT notified

## 2020-08-25 NOTE — ED Notes (Signed)
X-ray at bedside

## 2020-08-25 NOTE — ED Notes (Signed)
100 Roc given by [employer]

## 2020-08-25 NOTE — Progress Notes (Signed)
CDS notified of patient's status. Referral # [id number]. Bedside staff notified to expect a return call from a coordinator.

## 2020-08-25 NOTE — Consult Note (Signed)
Reversal ONSULT NOTE   Pharmacy Consult for [facility]  Indication: INR>10  Allergies  Allergen Reactions  . Sulfasalazine Hives  . Ciprofloxacin Hcl Other (See Comments)    Other reaction(s): Other (See Comments) KIDNEY FAILURE KIDNEY FAILURE   . Codeine Hives  . Gabapentin Other (See Comments)    Other reaction(s): Other (See Comments) confusion confusion   . Sulfa Antibiotics Hives    Patient Measurements: Height: 5\' 3"  (160 cm) Weight: (!) 170.4 kg (375 lb 10.6 oz) IBW/kg (Calculated) : 52.4  Vital Signs: Temp: 96.3 F (35.7 C) (09/14 1825) Temp Source: Bladder (09/14 1825) BP: 114/39 (09/14 1825) Pulse Rate: 69 (09/14 1825)  Labs: Recent Labs    08/17/2020 1120 09/04/2020 1550  HGB 3.1*  --   HCT 11.6*  --   PLT 476*  --   APTT 138* 149*  LABPROT >90.0* >90.0*  INR >10.0* >10.0*  CREATININE 4.17* 4.64*    Estimated Creatinine Clearance: 19.8 mL/min (A) (by C-G formula based on SCr of 4.64 mg/dL (H)).   Medications:  Warfarin 5mg  per chart review PTA   Assessment: Pt unresponsive. Per chart review, pt covered in black stool. On admit to ED INR was supratherapeutic @>10.0. Vitamin K IV 10 mg given x1 dose. Kcentra given today at 1656. INR that was scheduled for 1730 was canceled.   INR after infusion Date Time INR 9/14 1550 >10.0 (level prior to start of KCentra)  Goal of Therapy:  Will followup on INR    Plan:  Per consult will follow INR 1hr post KCentra then q6hrs x 4 occurrences, then daily. Next INR scheduled for 10 PM tonight- pharmacy will follow INR.     Rowland Lathe 09/01/2020,6:35 PM

## 2020-08-25 NOTE — Progress Notes (Signed)
Pt transported to and from CT with 2 RN's and this RT without difficulty

## 2020-08-25 NOTE — ED Notes (Signed)
Date and time results received: 08/31/2020 1317 (use smartphrase ".now" to insert current time)  Test: Hemoglobin Critical Value: 3.1  Name of Provider Notified: Dr. Cheri Fowler  Orders Received? Or Actions Taken?: Orders Received - See Orders for details

## 2020-08-25 NOTE — Progress Notes (Signed)
GOALS OF CARE DISCUSSION  The Clinical status was relayed to family in detail. Daughter at bedside updated  Updated and notified of patients medical condition.  Patient remains unresponsive and will not open eyes to command.   Upon assessment his breath sounds are course crackles with significant secretions to his oral pharyngeal region. Nasopharyngeal suction produced copious sanguineous secretions.    Patient is having a weak cough and struggling to remove secretions.   patient with increased WOB and using accessory muscles to breathe Explained to family course of therapy and the modalities     Patient with Progressive multiorgan failure with very low chance of meaningful recovery despite all aggressive and optimal medical therapy. Patient is in the Dying  Process associated with Suffering.  Family understands the situation.   at this time patient remains full code  Family are satisfied with Plan of action and management. All questions answered  Additional CC time 32 mins   Starling Jessie Patricia Pesa, M.D.  [facility]  Medical Director [place] Director [facility] Cardio-Pulmonary Department

## 2020-08-25 NOTE — ED Triage Notes (Addendum)
Pt comes via EMS from home with [facility] airway in placed. Pt was at home and was being changed by family when she went unresponsive. Family started CPR.  Pt has fall 2 days ago and refused treatment with EMS. Pt is on blood thinners.   EMS arrived and pt was pulseless. EMS stated PEA, asystole then Vtach and then 2 shocks given. 1 epi,300 amio, Pulse-SR, HR-72, BP-108/63, CBG-210

## 2020-08-25 NOTE — ED Provider Notes (Signed)
[facility] Emergency Department Provider Note   ____________________________________________   First MD Initiated Contact with Patient 08/15/2020 1125     (approximate)  I have reviewed the triage vital signs and the nursing notes.   HISTORY  Chief Complaint Unresponsive    HPI Tasha Long is a 64 y.o. female with a past medical history of COPD and atrial fibrillation as well as chronic bedbound who presents via EMS after being found unresponsive by family members during a diaper change just prior to arrival.  EMS noted patient to initially be in PEA, then asystole, then V. tach with 2 shocks given.  EMS also gave 1 dose of epinephrine, 3 mg amiodarone with return of spontaneous circulation.  Per family, patient had a fall 2 days ago and refused treatment.  Patient is on blood thinning medications for her atrial fibrillation.  King airway in place.         Past Medical History:  Diagnosis Date  . Atrial fibrillation ([place])   . COPD (chronic obstructive pulmonary disease) [facility])     Patient Active Problem List   Diagnosis Date Noted  . PEA (Pulseless electrical activity) ([place])   . Community acquired pneumonia   . Respiratory failure ([place])   . Bilateral leg edema   . Sepsis ([place])   . AKI (acute kidney injury) ([place])   . Altered mental status   . Respiratory arrest ([place]) 03/25/2016  . Cardiopulmonary arrest with successful resuscitation ([place])   . HYPOTHYROIDISM 11/28/2007  . OBESITY, MORBID 11/28/2007  . ASTHMA 11/28/2007  . DYSPNEA ON EXERTION 11/28/2007    History reviewed. No pertinent surgical history.  Prior to Admission medications   Medication Sig Start Date End Date Taking? Authorizing Provider  albuterol (PROVENTIL HFA;VENTOLIN HFA) 108 (90 Base) MCG/ACT inhaler Inhale 2 puffs into the lungs every 4 (four) hours as needed for wheezing or shortness of breath.    [provider]  arformoterol (BROVANA) 15 MCG/2ML  NEBU USE 1 VAIL VIA NABULIZER EVERY 12 HOURS 05/01/19   [provider]  budesonide (PULMICORT) 0.5 MG/2ML nebulizer solution USE 1 VAIL VIA [place] DAILY 05/18/20   [provider]  budesonide-formoterol (SYMBICORT) 160-4.5 MCG/ACT inhaler Inhale 2 puffs into the lungs 2 (two) times daily.    [provider]  busPIRone (BUSPAR) 30 MG tablet Take 30 mg by mouth 2 (two) times daily.    [provider]  citalopram (CELEXA) 40 MG tablet Take 40 mg by mouth daily.    [provider]  doxycycline (VIBRA-TABS) 100 MG tablet Take 1 tablet (100 mg total) by mouth 2 (two) times daily. 05/29/20   Fransico Meadow, PA-C  ipratropium-albuterol (DUONEB) 0.5-2.5 (3) MG/3ML SOLN Take 3 mLs by nebulization 4 (four) times daily as needed.    [provider]  levothyroxine (SYNTHROID, LEVOTHROID) 75 MCG tablet Take 75 mcg by mouth daily before breakfast.    [provider]  montelukast (SINGULAIR) 10 MG tablet Take by mouth. 03/13/20   [provider]  omeprazole (PRILOSEC) 20 MG capsule Take 20 mg by mouth daily.    [provider]  pramipexole (MIRAPEX) 0.125 MG tablet Take by mouth. 04/30/20 04/30/21  [provider]  predniSONE (DELTASONE) 50 MG tablet Take 1 tablet (50 mg total) by mouth daily. 05/29/20   Fransico Meadow, PA-C  tiotropium (SPIRIVA HANDIHALER) 18 MCG inhalation capsule Place into inhaler and inhale. 03/13/20   [provider]  warfarin (COUMADIN) 5 MG  tablet Take by mouth. 01/22/20 01/21/21  [provider]    Allergies Codeine and Sulfa antibiotics  No family history on file.  Social History Social History   Tobacco Use  . Smoking status: Former Research scientist (life sciences)  . Smokeless tobacco: Never Used  Vaping Use  . Vaping Use: Never used  Substance Use Topics  . Alcohol use: Never    Alcohol/week: 0.0 standard drinks  . Drug use: Never    Review of Systems Unable to  assess   ____________________________________________   PHYSICAL EXAM:  VITAL SIGNS: ED Triage Vitals  Enc Vitals Group     BP 08/23/2020 1115 (!) 100/41     Pulse Rate 08/12/2020 1115 64     Resp 08/16/2020 1115 13     Temp --      Temp src --      SpO2 08/31/2020 1115 99 %     Weight 08/27/2020 1117 (!) 385 lb 12.9 oz (175 kg)     Height 08/31/2020 1117 5\' 3"  (1.6 m)     Head Circumference --      Peak Flow --      Pain Score --      Pain Loc --      Pain Edu? --      Excl. in [place]? --     Constitutional: Intubated with [facility] airway, unresponsive Eyes: Conjunctivae are normal. PERRL Head: Atraumatic. Nose: No congestion/rhinnorhea.  Dried blood in bilateral nares Mouth/Throat: Mucous membranes are moist. Neck: No stridor Cardiovascular: Normal rate, regular rhythm.  Good peripheral circulation. Respiratory: Shallow respiratory effort Gastrointestinal: No distention. Musculoskeletal: Bilateral lower extremity edema.  No obvious deformities Neurologic: Intubated.  No response to painful stimuli with IV placement. Skin:  Skin is warm and dry.  Excoriation/ulceration over left lower extremity at the shin circumferentially  ____________________________________________   LABS (all labs ordered are listed, but only abnormal results are displayed)  Labs Reviewed  GLUCOSE, CAPILLARY - Abnormal; Notable for the following components:      Result Value   Glucose-Capillary 111 (*)    All other components within normal limits  ETHANOL  PROTIME-INR  APTT  CBC  DIFFERENTIAL  COMPREHENSIVE METABOLIC PANEL  URINE DRUG SCREEN, QUALITATIVE ([facility] ONLY)  URINALYSIS, ROUTINE W REFLEX MICROSCOPIC  I-STAT CREATININE, ED   ____________________________________________  EKG  ED ECG REPORT I, Naaman Plummer, the attending physician, personally viewed and interpreted this ECG.  Date: 08/13/2020 EKG Time: 1116 Rate: 61 Rhythm: Junctional rhythm QRS Axis: normal Intervals: normal ST/T  Wave abnormalities: normal Narrative Interpretation: no evidence of acute ischemia  ____________________________________________  RADIOLOGY  ED MD interpretation: Single portable chest x-ray shows patchy bilateral airspace infiltrates and adequate placement of the endotracheal tube as well as the orogastric tube  Official radiology report(s): DG Abd 1 View  Result Date: 08/14/2020 CLINICAL DATA:  Intubation, orogastric tube placement EXAM: ABDOMEN - 1 VIEW COMPARISON:  Portable exam 1144 hours compared to 03/28/2016 FINDINGS: Tip of nasogastric tube projects over distal gastric antrum. Patchy infiltrates at visualized lower lungs. Bones slightly demineralized. External pacing leads present. IMPRESSION: Tip of orogastric tube projects over distal gastric antrum. Electronically Signed   By: Lavonia Dana M.D.   On: 09/04/2020 12:02   DG Chest Portable 1 View  Result Date: 08/12/2020 CLINICAL DATA:  Intubation, atrial fibrillation, COPD, former smoker EXAM: PORTABLE CHEST 1 VIEW COMPARISON:  Portable exam 1141 hours compared to 05/06/2016 FINDINGS: Tip of endotracheal tube projects 3.5 cm above [place]ina. Nasogastric tube extends  into stomach. Enlargement of cardiac silhouette. Mediastinal contours normal. Numerous EKG leads project over chest. Patchy BILATERAL airspace infiltrates consistent with multifocal pneumonia. No pleural effusion or pneumothorax. IMPRESSION: Patchy BILATERAL airspace infiltrates consistent with multifocal pneumonia. Electronically Signed   By: Lavonia Dana M.D.   On: 09/03/2020 12:01    ____________________________________________   PROCEDURES  Procedure(s) performed (including Critical Care):  Procedure Name: Intubation Date/Time: 08/23/2020 12:17 PM Performed by: Naaman Plummer, MD Pre-anesthesia Checklist: Patient identified, Patient being monitored, Emergency Drugs available, Timeout performed and Suction available Oxygen Delivery Method: Non-rebreather  mask Preoxygenation: Pre-oxygenation with 100% oxygen Induction Type: Rapid sequence Ventilation: Mask ventilation without difficulty Laryngoscope Size: Glidescope and 4 Grade View: Grade I Tube size: 8.0 mm Number of attempts: 1 Airway Equipment and Method: Video-laryngoscopy Placement Confirmation: ETT inserted through vocal cords under direct vision,  CO2 detector and Breath sounds checked- equal and bilateral Secured at: 26 cm Dental Injury: Teeth and Oropharynx as per pre-operative assessment      .Critical Care Performed by: Naaman Plummer, MD Authorized by: Naaman Plummer, MD   Critical care provider statement:    Critical care time (minutes):  49   Critical care time was exclusive of:  Separately billable procedures and treating other patients   Critical care was necessary to treat or prevent imminent or life-threatening deterioration of the following conditions:  Respiratory failure and cardiac failure   Critical care was time spent personally by me on the following activities:  Discussions with consultants, evaluation of patient's response to treatment, examination of patient, ordering and performing treatments and interventions, ordering and review of laboratory studies, ordering and review of radiographic studies, pulse oximetry, re-evaluation of patient's condition, obtaining history from patient or surrogate and review of old charts     ____________________________________________   INITIAL IMPRESSION / [place] / ED COURSE        Patient presents after being found unresponsive and in asystole.  Prior to arrival patient had return of spontaneous circulation.  Patient's King tube was changed to endotracheal tube at bedside.  Please see procedure note for full details.  Patient's laboratory evaluation does show significant anemia with a hemoglobin of 3.1 and patient will be transfused at least 3 units to start with.  Patient's stool is grossly dark  diarrhea which is concerning for possible acute lower GI bleeding.  Patient is on anticoagulation for atrial fibrillation.  Patient's head CT did not show any evidence of active bleeding at this time.  I spoke to patient's daughter at bedside who agrees with plan for admission.  She states that she is concerned that patient may be significantly anemic but did not know where her bleeding is coming from.  I spoke to the intensivist who agreed to accept this patient for further evaluation and management.      ____________________________________________   FINAL CLINICAL IMPRESSION(S) / ED DIAGNOSES  Final diagnoses:  Encounter for imaging study to confirm orogastric (OG) tube placement     ED Discharge Orders    None       Note:  This document was prepared using Dragon voice recognition software and may include unintentional dictation errors.   Naaman Plummer, MD 08/18/2020 1504

## 2020-08-25 NOTE — Consult Note (Signed)
Reversal ONSULT NOTE   Pharmacy Consult for [facility]  Indication: INR>10  Allergies  Allergen Reactions  . Sulfasalazine Hives  . Ciprofloxacin Hcl Other (See Comments)    Other reaction(s): Other (See Comments) KIDNEY FAILURE KIDNEY FAILURE   . Codeine Hives  . Gabapentin Other (See Comments)    Other reaction(s): Other (See Comments) confusion confusion   . Sulfa Antibiotics Hives    Patient Measurements: Height: 5\' 3"  (160 cm) Weight: (!) 175 kg (385 lb 12.9 oz) IBW/kg (Calculated) : 52.4  Vital Signs: Temp: 93.9 F (34.4 C) (09/14 1400) BP: 106/46 (09/14 1400) Pulse Rate: 58 (09/14 1400)  Labs: Recent Labs    08/22/2020 1120  HGB 3.1*  HCT 11.6*  PLT 476*  APTT 138*  LABPROT >90.0*  INR >10.0*  CREATININE 4.17*    Estimated Creatinine Clearance: 22.4 mL/min (A) (by C-G formula based on SCr of 4.17 mg/dL (H)).   Medications:  Warfarin 5mg  per chart review PTA   Assessment: Pt unresponsive. Per chart review, pt covered in black stool. On admit to ED INR was supratherapeutic @>10.0  Goal of Therapy:  Will followup on INR    Plan:  Per consult will follow INR 1hr post KCentra then q6hrs x 4 occurrences, then daily.  Tasha Long 09/09/2020,2:56 PM

## 2020-08-25 NOTE — ED Notes (Signed)
Pt being bagged by RT at this time. MD at bedside

## 2020-08-25 NOTE — ED Notes (Signed)
Lab to come and collect type and screen at this time.

## 2020-08-25 NOTE — ED Notes (Signed)
100 ketamine given by Alwyn Pea

## 2020-08-25 NOTE — ED Notes (Signed)
Date and time results received: 08/21/2020 1346 (use smartphrase ".now" to insert current time)  Test: INR Critical Value: >10  Name of Provider Notified: MD Cheri Fowler  Orders Received? Or Actions Taken?: Orders Received - See Orders for details

## 2020-08-25 NOTE — ED Notes (Signed)
Pt intubated at this time.  8-0, 26 at the lip by MD Cheri Fowler

## 2020-08-26 ENCOUNTER — Inpatient Hospital Stay: Payer: Medicaid Other

## 2020-08-26 DIAGNOSIS — Z7189 Other specified counseling: Secondary | ICD-10-CM

## 2020-08-26 LAB — HEMOGLOBIN AND HEMATOCRIT, BLOOD
HCT: 19.7 % — ABNORMAL LOW (ref 36.0–46.0)
HCT: 19 % — ABNORMAL LOW (ref 36.0–46.0)
HCT: 20.3 % — ABNORMAL LOW (ref 36.0–46.0)
Hemoglobin: 6.1 g/dL — ABNORMAL LOW (ref 12.0–15.0)
Hemoglobin: 6.2 g/dL — ABNORMAL LOW (ref 12.0–15.0)
Hemoglobin: 6.6 g/dL — ABNORMAL LOW (ref 12.0–15.0)

## 2020-08-26 LAB — CBC WITH DIFFERENTIAL/PLATELET
Abs Immature Granulocytes: 0.25 10*3/uL — ABNORMAL HIGH (ref 0.00–0.07)
Basophils Absolute: 0 10*3/uL (ref 0.0–0.1)
Basophils Relative: 0 %
Eosinophils Absolute: 0 10*3/uL (ref 0.0–0.5)
Eosinophils Relative: 0 %
HCT: 20.6 % — ABNORMAL LOW (ref 36.0–46.0)
Hemoglobin: 6.5 g/dL — ABNORMAL LOW (ref 12.0–15.0)
Immature Granulocytes: 3 %
Lymphocytes Relative: 10 %
Lymphs Abs: 1 10*3/uL (ref 0.7–4.0)
MCH: 23.3 pg — ABNORMAL LOW (ref 26.0–34.0)
MCHC: 31.6 g/dL (ref 30.0–36.0)
MCV: 73.8 fL — ABNORMAL LOW (ref 80.0–100.0)
Monocytes Absolute: 0.9 10*3/uL (ref 0.1–1.0)
Monocytes Relative: 9 %
Neutro Abs: 7.8 10*3/uL — ABNORMAL HIGH (ref 1.7–7.7)
Neutrophils Relative %: 78 %
Platelets: 316 10*3/uL (ref 150–400)
RBC: 2.79 MIL/uL — ABNORMAL LOW (ref 3.87–5.11)
RDW: 25.3 % — ABNORMAL HIGH (ref 11.5–15.5)
Smear Review: NORMAL
WBC: 9.9 10*3/uL (ref 4.0–10.5)
nRBC: 15.5 % — ABNORMAL HIGH (ref 0.0–0.2)

## 2020-08-26 LAB — BASIC METABOLIC PANEL
Anion gap: 12 (ref 5–15)
Anion gap: 11 (ref 5–15)
BUN: 92 mg/dL — ABNORMAL HIGH (ref 8–23)
BUN: 95 mg/dL — ABNORMAL HIGH (ref 8–23)
CO2: 28 mmol/L (ref 22–32)
CO2: 30 mmol/L (ref 22–32)
Calcium: 7.3 mg/dL — ABNORMAL LOW (ref 8.9–10.3)
Calcium: 7.3 mg/dL — ABNORMAL LOW (ref 8.9–10.3)
Chloride: 93 mmol/L — ABNORMAL LOW (ref 98–111)
Chloride: 94 mmol/L — ABNORMAL LOW (ref 98–111)
Creatinine, Ser: 4.79 mg/dL — ABNORMAL HIGH (ref 0.44–1.00)
Creatinine, Ser: 4.84 mg/dL — ABNORMAL HIGH (ref 0.44–1.00)
GFR calc Af Amer: 11 mL/min — ABNORMAL LOW (ref >60)
GFR calc Af Amer: 10 mL/min — ABNORMAL LOW (ref >60)
GFR calc non Af Amer: 9 mL/min — ABNORMAL LOW (ref >60)
GFR calc non Af Amer: 9 mL/min — ABNORMAL LOW (ref >60)
Glucose, Bld: 83 mg/dL (ref 70–99)
Glucose, Bld: 83 mg/dL (ref 70–99)
Potassium: 5.6 mmol/L — ABNORMAL HIGH (ref 3.5–5.1)
Potassium: 5.5 mmol/L — ABNORMAL HIGH (ref 3.5–5.1)
Sodium: 133 mmol/L — ABNORMAL LOW (ref 135–145)
Sodium: 135 mmol/L (ref 135–145)

## 2020-08-26 LAB — PROTIME-INR
INR: 2.3 — ABNORMAL HIGH (ref 0.8–1.2)
INR: 2.1 — ABNORMAL HIGH (ref 0.8–1.2)
INR: 2.2 — ABNORMAL HIGH (ref 0.8–1.2)
Prothrombin Time: 24.6 seconds — ABNORMAL HIGH (ref 11.4–15.2)
Prothrombin Time: 22.9 seconds — ABNORMAL HIGH (ref 11.4–15.2)
Prothrombin Time: 23.6 seconds — ABNORMAL HIGH (ref 11.4–15.2)

## 2020-08-26 LAB — BPAM FFP
Blood Product Expiration Date: 202109192359
ISSUE DATE / TIME: 202109142039
Unit Type and Rh: 6200

## 2020-08-26 LAB — RENAL FUNCTION PANEL
Albumin: 2 g/dL — ABNORMAL LOW (ref 3.5–5.0)
Anion gap: 12 (ref 5–15)
BUN: 92 mg/dL — ABNORMAL HIGH (ref 8–23)
CO2: 29 mmol/L (ref 22–32)
Calcium: 7.4 mg/dL — ABNORMAL LOW (ref 8.9–10.3)
Chloride: 94 mmol/L — ABNORMAL LOW (ref 98–111)
Creatinine, Ser: 4.78 mg/dL — ABNORMAL HIGH (ref 0.44–1.00)
GFR calc Af Amer: 11 mL/min — ABNORMAL LOW (ref >60)
GFR calc non Af Amer: 9 mL/min — ABNORMAL LOW (ref >60)
Glucose, Bld: 80 mg/dL (ref 70–99)
Phosphorus: 6.9 mg/dL — ABNORMAL HIGH (ref 2.5–4.6)
Potassium: 5.5 mmol/L — ABNORMAL HIGH (ref 3.5–5.1)
Sodium: 135 mmol/L (ref 135–145)

## 2020-08-26 LAB — GLUCOSE, CAPILLARY
Glucose-Capillary: 100 mg/dL — ABNORMAL HIGH (ref 70–99)
Glucose-Capillary: 74 mg/dL (ref 70–99)
Glucose-Capillary: 81 mg/dL (ref 70–99)
Glucose-Capillary: 86 mg/dL (ref 70–99)
Glucose-Capillary: 92 mg/dL (ref 70–99)

## 2020-08-26 LAB — PREPARE FRESH FROZEN PLASMA

## 2020-08-26 LAB — PHOSPHORUS: Phosphorus: 7 mg/dL — ABNORMAL HIGH (ref 2.5–4.6)

## 2020-08-26 LAB — HIV ANTIBODY (ROUTINE TESTING W REFLEX): HIV Screen 4th Generation wRfx: NONREACTIVE

## 2020-08-26 LAB — MAGNESIUM: Magnesium: 2.5 mg/dL — ABNORMAL HIGH (ref 1.7–2.4)

## 2020-08-26 MED ORDER — POLYVINYL ALCOHOL 1.4 % OP SOLN
1.0000 [drp] | Freq: Four times a day (QID) | OPHTHALMIC | Status: DC | PRN
  Filled 2020-08-26: qty 15

## 2020-08-26 MED ORDER — ONDANSETRON HCL 4 MG/2ML IJ SOLN: 4.0000 mg | Freq: Four times a day (QID) | INTRAMUSCULAR | Status: DC | PRN

## 2020-08-26 MED ORDER — PRISMASOL BGK 0/2.5 32-2.5 MEQ/L IV SOLN
INTRAVENOUS | Status: DC
  Filled 2020-08-26: qty 5000

## 2020-08-26 MED ORDER — PANTOPRAZOLE SODIUM 40 MG IV SOLR
40.0000 mg | Freq: Two times a day (BID) | INTRAVENOUS | Status: DC
  Administered 2020-08-26: 40 mg via INTRAVENOUS
  Filled 2020-08-26: qty 40

## 2020-08-26 MED ORDER — GLYCOPYRROLATE 0.2 MG/ML IJ SOLN
0.2000 mg | INTRAMUSCULAR | Status: DC | PRN
  Filled 2020-08-26: qty 1

## 2020-08-26 MED ORDER — CEFAZOLIN SODIUM-DEXTROSE 1-4 GM/50ML-% IV SOLN
1.0000 g | Freq: Two times a day (BID) | INTRAVENOUS | Status: DC
  Filled 2020-08-26 (×2): qty 50

## 2020-08-26 MED ORDER — SODIUM CHLORIDE 0.9% IV SOLUTION: Freq: Once | INTRAVENOUS | Status: AC

## 2020-08-26 MED ORDER — ANTICOAGULANT SODIUM CITRATE 4% (200MG/5ML) IV SOLN
5.0000 mL | Status: DC | PRN
  Administered 2020-08-26: 5 mL
  Filled 2020-08-26 (×2): qty 5

## 2020-08-26 MED ORDER — ACETAMINOPHEN 650 MG RE SUPP: 650.0000 mg | Freq: Four times a day (QID) | RECTAL | Status: DC | PRN

## 2020-08-26 MED ORDER — BIOTENE DRY MOUTH MT LIQD: 15.0000 mL | OROMUCOSAL | Status: DC | PRN

## 2020-08-26 MED ORDER — HEPARIN SODIUM (PORCINE) 1000 UNIT/ML DIALYSIS
1000.0000 [IU] | INTRAMUSCULAR | Status: DC | PRN
  Filled 2020-08-26: qty 6

## 2020-08-26 MED ORDER — HALOPERIDOL LACTATE 5 MG/ML IJ SOLN: 0.5000 mg | INTRAMUSCULAR | Status: DC | PRN

## 2020-08-26 MED ORDER — LORAZEPAM 2 MG/ML IJ SOLN
1.0000 mg | INTRAMUSCULAR | Status: DC | PRN
  Administered 2020-08-26: 2 mg via INTRAVENOUS
  Filled 2020-08-26: qty 1

## 2020-08-26 MED FILL — Medication: Qty: 1 | Status: AC

## 2020-08-27 LAB — BPAM RBC
Blood Product Expiration Date: 202109192359
Blood Product Expiration Date: 202110112359
Blood Product Expiration Date: 202110112359
Blood Product Expiration Date: 202110112359
Blood Product Expiration Date: 202110112359
Blood Product Expiration Date: 202110112359
ISSUE DATE / TIME: 202109141620
ISSUE DATE / TIME: 202109141804
ISSUE DATE / TIME: 202109142149
ISSUE DATE / TIME: 202109150421
Unit Type and Rh: 6200
Unit Type and Rh: 6200
Unit Type and Rh: 6200
Unit Type and Rh: 6200
Unit Type and Rh: 6200
Unit Type and Rh: 6200

## 2020-08-27 LAB — TYPE AND SCREEN
ABO/RH(D): A POS
Antibody Screen: NEGATIVE
Unit division: 0
Unit division: 0
Unit division: 0
Unit division: 0
Unit division: 0
Unit division: 0

## 2020-08-27 LAB — PREPARE RBC (CROSSMATCH)

## 2020-08-30 LAB — CULTURE, BLOOD (ROUTINE X 2)
Culture: NO GROWTH
Culture: NO GROWTH
Special Requests: ADEQUATE
Special Requests: ADEQUATE

## 2020-09-11 NOTE — Progress Notes (Signed)
[name] visited pt.'s rm. after observing family present at bedside; two dtrs. and son at bedside; pt. intubated and not responsive.  Dtrs. very tearful; dtr. Tasha Long shared she found pt. unresponsive at home and began CPR; Tasha Long burdened by the sense that she 'broke' her mother but also did not do enough to save her; [place] attempted to reassure dtr. pt.'s condition is not her fault.  Dtr. Tasha Long spoke at length w/her sister re: pt.'s condition and presumed wishes to not be in her current condition of being hooked up to machines; family discussing code status and whether to proceed with giving pt. blood transfusion; both dtrs. accepting of situation that pt. seems to be 'already gone' and beyond meaningful recovery; dtrs. called several other family members to have them say their goodbyes via speakerphone laid on pt.'s chest.  Family left rm. to take a smoking break before making final decision to move to comfort care.  CH provided supportive presence and gently facilitated discussion of pt.'s wishes in context of family's decisionmaking re: plan of care.  Family aware of [place] availability. When [facility] returned to rm. later this evening, pt. had expired; no family present.

## 2020-09-11 NOTE — Progress Notes (Signed)
Initial Nutrition Assessment  DOCUMENTATION CODES:   Morbid obesity  INTERVENTION:  If tube feeds initiated recommend: -Vital High Protein at 50 mL/hr (1200 mL goal daily volume per tube) -PROSource TF 45 mL TID per tube -Goal regimen provides 1320 kcal, 138 grams of protein, 1008 mL H2O daily -MVI daily per tube  NUTRITION DIAGNOSIS:   Inadequate oral intake related to inability to eat as evidenced by NPO status.  GOAL:   Patient will meet greater than or equal to 90% of their needs  MONITOR:   Vent status, Labs, Weight trends, I & O's, Skin  REASON FOR ASSESSMENT:   Ventilator    ASSESSMENT:   64 year old female with PMHx of COPD, A-fib on Coumadin admitted after cardiac arrest, intubated, and initiated on 36C TTM protocol.   9/14 intubated 9/15 cooling target temperature achieved 1541  Patient is currently intubated on ventilator support MV: 6.7 L/min Temp (24hrs), Avg:95.4 F (35.2 C), Min:91.9 F (33.3 C), Max:97 F (36.1 C)  Medications reviewed and include: Colace 100 mg BID, Protonix, Miralax, cefazolin, fentanyl gtt, sodium bicarbonate 150 mEq in sterile water at 50 mL/hr.  Labs reviewed: CBG 74-92, Phosphorus 7, Magnesium 2.5.  I/O: 226 mL UOP yesterday  Enteral Access: 16 Fr. OGT placed 9/14; terminates in distal gastric antrum per abdominal x-ray 9/14  Discussed with RN and on rounds. Plan is to hold tube feeds today.  NUTRITION - FOCUSED PHYSICAL EXAM:    Most Recent Value  Orbital Region No depletion  Upper Arm Region No depletion  Thoracic and Lumbar Region No depletion  Buccal Region Unable to assess  Temple Region No depletion  Clavicle Bone Region No depletion  Clavicle and Acromion Bone Region No depletion  Scapular Bone Region Unable to assess  Dorsal Hand No depletion  Patellar Region Unable to assess  Anterior Thigh Region Unable to assess  Posterior Calf Region Unable to assess  Edema (RD Assessment) Moderate  Hair Reviewed   Eyes Unable to assess  Mouth Unable to assess  Skin Reviewed  Nails Reviewed     Diet Order:   Diet Order            Diet NPO time specified  Diet effective now                EDUCATION NEEDS:   No education needs have been identified at this time  Skin:  Skin Assessment: Skin Integrity Issues: (cellulitis and weeping to bilateral legs)  Last BM:  09/09/2020  Height:   Ht Readings from Last 1 Encounters:  08/24/2020 5\' 3"  (1.6 m)   Weight:   Wt Readings from Last 1 Encounters:  09/05/2020 (!) 170.4 kg   Ideal Body Weight:  52.3 kg  BMI:  Body mass index is 66.55 kg/m.  Estimated Nutritional Needs:   Kcal:  4332-9518  Protein:  131 grams  Fluid:  1.8 L/day  Jacklynn Barnacle, MS, RD, LDN Pager number available on Amion

## 2020-09-11 NOTE — Progress Notes (Signed)
Palliative:  Chart reviewed. Discussed case with RN. Called and spoke with patient's daughter, Marita [place] - when I introduced myself as palliative care she interrupted and told me "I know who you are". I attempted to explain the purpose of palliative care consult to assist with goals of care. Marita [place] explained the family was not interested in talking to [place] at this time. Discussed with Marita [place] that we would be available as needed and to please let the healthcare team know if family becomes interested in discussing goals of care.  Juel Burrow, DNP, AGNP-C Palliative Medicine Team Team Phone # [phone]  Pager # [phone]  NO CHARGE

## 2020-09-11 NOTE — Consult Note (Signed)
Anticoagulant [facility] [place] for [facility]  Indication: INR>10  Allergies  Allergen Reactions  . Ciprofloxacin Hcl Other (See Comments)    Other reaction(s): Other (See Comments) PATIENT HAS KIDNEY FAILURE KIDNEY FAILURE   . Sulfa Antibiotics Hives  . Sulfasalazine Hives  . Codeine Hives  . Gabapentin Other (See Comments)    Other reaction(s): Other (See Comments) confusion confusion     Patient Measurements: Height: 5\' 3"  (160 cm) Weight: (!) 170.4 kg (375 lb 10.6 oz) IBW/kg (Calculated) : 52.4  Vital Signs: Temp: 96.8 F (36 C) (09/15 1500) Temp Source: Bladder (09/15 1500) BP: 103/42 (09/15 1500) Pulse Rate: 74 (09/15 1500)  Labs: Recent Labs    09/02/2020 1120 08/28/2020 1120 08/13/2020 1550 08/28/2020 1852 09/16/2020 0114 09-16-20 0114 09/16/20 0640 16-Sep-2020 1232  HGB 3.1*   < >  --   --  6.1*   < > 6.5* 6.2*  HCT 11.6*   < >  --   --  19.7*  --  20.6* 19.0*  PLT 476*  --   --   --   --   --  316  --   APTT 138*  --  149*  --   --   --   --   --   LABPROT >90.0*   < > >90.0*   < > 24.6*  --  22.9* 23.6*  INR >10.0*   < > >10.0*   < > 2.3*  --  2.1* 2.2*  CREATININE 4.17*  --  4.64*  --   --   --   --  4.79*   < > = values in this interval not displayed.    Estimated Creatinine Clearance: 19.1 mL/min (A) (by C-G formula based on SCr of 4.79 mg/dL (H)).   Medications:  Warfarin 5mg  per chart review PTA   Assessment: Pt unresponsive. Per chart review, pt covered in black stool. On admit to ED INR was supratherapeutic @>10.0. Vitamin K IV 10 mg given x1 dose. Kcentra given today at 1656. INR that was scheduled for 1730 was canceled.   INR after infusion Date Time INR 9/14 1550 >10.0 (level prior to start of KCentra) 9/14 1852 4.6 9/15 0640 2.1 9/15 1232 2.2     Plan:  Continue to follow INRs post Kcentra per pharmacy protocol. Next INR with morning labs. Continue to monitor.   Tawnya Crook, PharmD 2020/09/16,3:40 PM

## 2020-09-11 NOTE — Progress Notes (Signed)
Pts daughter Tasha Long at bedside and informed me she wanted to transition pt to comfort measures only.  I also spoke with pts son Tasha Long and daughter Tasha Long via telephone and they both agreed they wanted to transition pt to comfort measures only.  Pts family all stated the pt made it clear she would not want mechanical ventilation or life saving measures.  Therefore, will transition pt to comfort measures only per family request.  Plan of care discussed with Dr. Mortimer Fries via telephone and he agreed with transitioning pt to comfort measures only and initiating end of life policy.  Will continue to monitor and assess pt.  Marda Stalker, [place] Pager [phone] (please enter 7 digits) PCCM Consult Pager [phone] (please enter 7 digits)

## 2020-09-11 NOTE — Procedures (Signed)
Patient Name: Tasha Long  MRN: [id number]  Epilepsy Attending: Lora Havens  Referring Physician/Provider: Dr Flora Lipps Date: 09/12/20 Duration: 24.58 mins  Patient history: 63yo F s/p cardiac arrest on TTM. EEG to evaluate for seizure.  Level of alertness: comatose  AEDs during EEG study: None  Technical aspects: This EEG study was done with scalp electrodes positioned according to the 10-20 International system of electrode placement. Electrical activity was acquired at a sampling rate of 500Hz  and reviewed with a high frequency filter of 70Hz  and a low frequency filter of 1Hz . EEG data were recorded continuously and digitally stored.   Description:  EEG showed generalized background suppression. EEG was not reactive to noxious stimulation. Hyperventilation and photic stimulation were not performed.     ABNORMALITY - Background suppression, generalized   IMPRESSION: This study is suggestive of profound diffuse encephalopathy, nonspecific etiology but likely related to sedation, anoxic/hypoxic brain injury. No seizures or epileptiform discharges were seen throughout the recording.  Demaris Leavell Barbra Sarks

## 2020-09-11 NOTE — Consult Note (Signed)
[facility] CONSULT NOTE    Date: 05-Sep-2020                  Patient Name:  Tasha Long  MRN: [id number]  DOB: November 18, 1956  Age / Sex: 64 y.o., female         PCP: Ricardo Jericho, NP                 Service Requesting Consult:  Critical care                 Reason for Consult:  Acute kidney injury            History of Present Illness: Patient is a 64 y.o. female with a PMHx of atrial fibrillation, COPD, morbid obesity, hypertension, depression, seasonal allergies, chronic lower extremity edema, hypothyroidism who was admitted to [facility] on 08/24/2020 for evaluation of unresponsiveness.  Patient had a cardiac arrest.  CPR was started by the daughter and her approximate downtime was 35 minutes.  EMS showed PEA asystole rhythm with V. tach and several shocks were administered.  Patient has now developed multiorgan failure and is currently maintained on the ventilator.  She was started on hypothermia protocol.  We are asked to see her for acute kidney injury.  BUN now 92 with a creatinine of 4.79.  Potassium also high at 5.6.  eGFR currently 9.  Also significantly anemic with a hemoglobin of 6.2.   Medications: Outpatient medications: Medications Prior to Admission  Medication Sig Dispense Refill Last Dose  . albuterol (PROVENTIL HFA;VENTOLIN HFA) 108 (90 Base) MCG/ACT inhaler Inhale 2 puffs into the lungs every 4 (four) hours as needed for wheezing or shortness of breath.   Unknown at Prn  . amLODipine (NORVASC) 5 MG tablet Take 5 mg by mouth daily.    08/24/2020 at 0800  . busPIRone (BUSPAR) 30 MG tablet Take 15 mg by mouth 2 (two) times daily.    08/24/2020 at 2000  . clotrimazole (LOTRIMIN) 1 % cream Apply topically 2 (two) times daily.   08/24/2020 at 0800  . DULoxetine (CYMBALTA) 30 MG capsule Take 30 mg by mouth daily.    08/24/2020 at 0800  . DULoxetine (CYMBALTA) 60 MG capsule Take 60 mg by mouth daily.   08/24/2020 at 2000  .  Fluticasone-Umeclidin-Vilant (TRELEGY ELLIPTA) 200-62.5-25 MCG/INH AEPB Inhale 1 puff into the lungs daily.    Past Week at Unknown time  . ipratropium-albuterol (DUONEB) 0.5-2.5 (3) MG/3ML SOLN Take 3 mLs by nebulization 4 (four) times daily as needed.   09/07/2020 at Prn  . levothyroxine (SYNTHROID) 125 MCG tablet Take 125 mcg by mouth daily before breakfast.    08/24/2020 at 0700  . montelukast (SINGULAIR) 10 MG tablet Take 10 mg by mouth at bedtime.    08/24/2020 at 2000  . omeprazole (PRILOSEC) 20 MG capsule Take 20 mg by mouth daily.   08/24/2020 at 2000  . pramipexole (MIRAPEX) 0.125 MG tablet Take 0.125 mg by mouth at bedtime.    08/24/2020 at 2000  . torsemide (DEMADEX) 100 MG tablet Take 100 mg by mouth daily.   08/24/2020 at 0800  . acetaminophen (TYLENOL) 325 MG tablet Take 325-650 mg by mouth every 6 (six) hours as needed for mild pain or fever.    Unknown at Prn  . predniSONE (DELTASONE) 10 MG tablet Take 10 mg by mouth daily with breakfast. Prednisone 40 mg po day 1-2, 30 mg po day 3-4, 20 mg po 5-6, 10  mg po 7-8, 5 mg po 9-10 and stop (Patient not taking: Reported on 09-17-20)   Not Taking at Unknown time  . predniSONE (DELTASONE) 50 MG tablet Take 1 tablet (50 mg total) by mouth daily. 6 tablet 0   . warfarin (COUMADIN) 5 MG tablet Take 5 mg by mouth daily at 4 PM.    08/21/2020 at 1600    Current medications: Current Facility-Administered Medications  Medication Dose Route Frequency Provider Last Rate Last Admin  . 0.9 %  sodium chloride infusion (Manually program via Guardrails IV Fluids)   Intravenous Once Awilda Bill, NP      . 0.9 %  sodium chloride infusion   Intravenous Continuous Flora Lipps, MD   Stopped at 09/08/2020 1759  . anticoagulant sodium citrate solution 5 mL  5 mL Intracatheter PRN Awilda Bill, NP   5 mL at 2020/09/17 0646  . ceFAZolin (ANCEF) IVPB 1 g/50 mL premix  1 g Intravenous Q12H Kasa, Maretta Bees, MD      . chlorhexidine gluconate (MEDLINE KIT) (PERIDEX)  0.12 % solution 15 mL  15 mL Mouth Rinse BID Flora Lipps, MD   15 mL at 17-Sep-2020 0846  . Chlorhexidine Gluconate Cloth 2 % PADS 6 each  6 each Topical Daily Flora Lipps, MD   6 each at 09-17-20 1029  . docusate (COLACE) 50 MG/5ML liquid 100 mg  100 mg Oral BID Awilda Bill, NP   100 mg at 09/17/20 1049  . fentaNYL (SUBLIMAZE) bolus via infusion 50 mcg  50 mcg Intravenous Q15 min PRN Awilda Bill, NP   50 mcg at 08/27/2020 2032  . fentaNYL (SUBLIMAZE) injection 50 mcg  50 mcg Intravenous Once Awilda Bill, NP      . fentaNYL 2540mg in NS 2550m(1067mml) infusion-PREMIX  50-200 mcg/hr Intravenous Continuous BlaAwilda BillP 10 mL/hr at 09/October 07, 202100 100 mcg/hr at 08/2020/09/699  . heparin injection 1,000-6,000 Units  1,000-6,000 Units CRRT PRN Genesi Stefanko, MD      . MEDLINE mouth rinse  15 mL Mouth Rinse 10 times per day KasFlora LippsD   15 mL at 08/2020/09/737  . mupirocin ointment (BACTROBAN) 2 % 1 application  1 application Nasal BID KasFlora LippsD   1 application at 09/69/45/0353  . norepinephrine (LEVOPHED) 4mg108m 250mL51mmix infusion  0-40 mcg/min Intravenous Continuous BradlNaaman Plummer  Paused at 08/22/2020 2141  . pantoprazole (PROTONIX) injection 40 mg  40 mg Intravenous Q12H BlakeAwilda Bill  40 mg at 09/15October 07, 2021  . polyethylene glycol (MIRALAX / GLYCOLAX) packet 17 g  17 g Oral Daily BlakeAwilda Bill  17 g at 09/15October 07, 2021  . prismasol BGK 2/2.5 dialysis solution   CRRT Continuous Temitope Flammer, MD      . prismasol BGK 2/2.5 replacement solution   CRRT Continuous Meerab Maselli, MD      . prismasol BGK 2/2.5 replacement solution   CRRT Continuous Kalifa Cadden, MD      . sodium bicarbonate 150 mEq in sterile water 1,000 mL infusion   Intravenous Continuous Kasa,Flora Lipps50 mL/hr at 08/1506-Oct-2021 Rate Verify at 09/152021-10-07      Allergies: Allergies  Allergen Reactions  . Ciprofloxacin Hcl Other (See Comments)    Other reaction(s): Other  (See Comments) PATIENT HAS KIDNEY FAILURE KIDNEY FAILURE   . Sulfa Antibiotics Hives  . Sulfasalazine Hives  . Codeine Hives  . Gabapentin Other (  See Comments)    Other reaction(s): Other (See Comments) confusion confusion       Past Medical History: Past Medical History:  Diagnosis Date  . Atrial fibrillation ([place])   . COPD (chronic obstructive pulmonary disease) ([place])      Past Surgical History: History reviewed. No pertinent surgical history.   Family History: No family history on file.   Social History: Social History   Socioeconomic History  . Marital status: Married    Spouse name: Not on file  . Number of children: Not on file  . Years of education: Not on file  . Highest education level: Not on file  Occupational History  . Not on file  Tobacco Use  . Smoking status: Former Research scientist (life sciences)  . Smokeless tobacco: Never Used  Vaping Use  . Vaping Use: Never used  Substance and Sexual Activity  . Alcohol use: Never    Alcohol/week: 0.0 standard drinks  . Drug use: Never  . Sexual activity: Not on file  Other Topics Concern  . Not on file  Social History Narrative  . Not on file   Social Determinants of Health   Financial Resource Strain:   . Difficulty of Paying Living Expenses: Not on file  Food Insecurity:   . Worried About Charity fundraiser in the Last Year: Not on file  . Ran Out of Food in the Last Year: Not on file  Transportation Needs:   . Lack of Transportation (Medical): Not on file  . Lack of Transportation (Non-Medical): Not on file  Physical Activity:   . Days of Exercise per Week: Not on file  . Minutes of Exercise per Session: Not on file  Stress:   . Feeling of Stress : Not on file  Social Connections:   . Frequency of Communication with Friends and Family: Not on file  . Frequency of Social Gatherings with Friends and Family: Not on file  . Attends Religious Services: Not on file  . Active Member of Clubs or Organizations: Not  on file  . Attends Archivist Meetings: Not on file  . Marital Status: Not on file  Intimate Partner Violence:   . Fear of Current or Ex-Partner: Not on file  . Emotionally Abused: Not on file  . Physically Abused: Not on file  . Sexually Abused: Not on file     Review of Systems: Unable to obtain review of systems as the patient is intubated, sedated, and on hypothermia protocol  Vital Signs: Blood pressure (!) 103/42, pulse 74, temperature (!) 96.8 F (36 C), temperature source Bladder, resp. rate 18, height 5' 3"  (1.6 m), weight (!) 170.4 kg, SpO2 94 %.  Weight trends: Filed Weights   08/31/2020 1117 08/31/2020 1541  Weight: (!) 175 kg (!) 170.4 kg    Physical Exam: Physical Exam: General:  Critically ill-appearing  Head:  Normocephalic, atraumatic.  Endotracheal tube in place  Eyes:  Anicteric  Neck:  Supple  Lungs:   Bilateral rhonchi, vent assisted  Heart:  S1S2 no rubs  Abdomen:   Soft, nontender, bowel sounds present  Extremities:  2+ brawny peripheral edema.  Neurologic:  Intubated, sedated, on hypothermia protocol  Skin:  Changes of venous stasis in bilateral lower extremities  Access:  Left internal jugular temporary dialysis catheter    Lab results: Basic Metabolic Panel: Recent Labs  Lab 08/20/2020 1120 09/05/2020 1550 2020-09-21 0640 21-Sep-2020 1232  NA 133* 133*  --  133*  K 5.7* 5.9*  --  5.6*  CL 93* 91*  --  93*  CO2 21* 24  --  28  GLUCOSE 96 104*  --  83  BUN 80* 84*  --  92*  CREATININE 4.17* 4.64*  --  4.79*  CALCIUM 7.6* 7.9*  --  7.3*  MG  --   --  2.5*  --   PHOS  --   --  7.0*  --     Liver Function Tests: Recent Labs  Lab 09/06/2020 1120  AST 58*  ALT 21  ALKPHOS 48  BILITOT 0.9  PROT 5.6*  ALBUMIN 2.2*   No results for input(s): LIPASE, AMYLASE in the last 168 hours. No results for input(s): AMMONIA in the last 168 hours.  CBC: Recent Labs  Lab 09/03/2020 1120 08/13/2020 1120 09-03-2020 0114 2020/09/03 0640 2020-09-03 1232   WBC 11.9*  --   --  9.9  --   NEUTROABS 8.4*  --   --  7.8*  --   HGB 3.1*   < > 6.1* 6.5* 6.2*  HCT 11.6*   < > 19.7* 20.6* 19.0*  MCV 66.7*  --   --  73.8*  --   PLT 476*  --   --  316  --    < > = values in this interval not displayed.    Cardiac Enzymes: No results for input(s): CKTOTAL, CKMB, CKMBINDEX, TROPONINI in the last 168 hours.  BNP: Invalid input(s): POCBNP  CBG: Recent Labs  Lab 09/03/20 0001 2020/09/03 0351 Sep 03, 2020 0709 09/03/20 1126 09/03/20 1521  GLUCAP 100* 92 86 74 81    Microbiology: Results for orders placed or performed during the hospital encounter of 08/14/2020  SARS Coronavirus 2 by RT PCR (hospital order, performed in [facility] hospital lab) Nasopharyngeal Nasopharyngeal Swab     Status: None   Collection Time: 08/29/2020 11:20 AM   Specimen: Nasopharyngeal Swab  Result Value Ref Range Status   SARS Coronavirus 2 NEGATIVE NEGATIVE Final    Comment: (NOTE) SARS-CoV-2 target nucleic acids are NOT DETECTED.  The SARS-CoV-2 RNA is generally detectable in upper and lower respiratory specimens during the acute phase of infection. The lowest concentration of SARS-CoV-2 viral copies this assay can detect is 250 copies / mL. A negative result does not preclude SARS-CoV-2 infection and should not be used as the sole basis for treatment or other patient management decisions.  A negative result may occur with improper specimen collection / handling, submission of specimen other than nasopharyngeal swab, presence of viral mutation(s) within the areas targeted by this assay, and inadequate number of viral copies (<250 copies / mL). A negative result must be combined with clinical observations, patient history, and epidemiological information.  Fact Sheet for Patients:   StrictlyIdeas.no  Fact Sheet for Healthcare Providers: BankingDealers.co.za  This test is not yet approved or  cleared by the [place] FDA and has been authorized for detection and/or diagnosis of SARS-CoV-2 by FDA under an Emergency Use Authorization (EUA).  This EUA will remain in effect (meaning this test can be used) for the duration of the COVID-19 declaration under Section 564(b)(1) of the Act, 21 U.S.C. section 360bbb-3(b)(1), unless the authorization is terminated or revoked sooner.  Performed at [facility], [address] [place], [place] [address]   MRSA PCR Screening     Status: Abnormal   Collection Time: 09/02/2020  4:28 PM   Specimen: Nasopharyngeal  Result Value Ref Range Status   MRSA by PCR POSITIVE (A) NEGATIVE  Final    Comment:        The GeneXpert MRSA Assay (FDA approved for NASAL specimens only), is one component of a comprehensive MRSA colonization surveillance program. It is not intended to diagnose MRSA infection nor to guide or monitor treatment for MRSA infections. RESULT CALLED TO, READ BACK BY AND VERIFIED WITH: [facility] MOORE 08/23/2020 AT 1735 BY ACR Performed at [facility], [address] [place], [place] [address]   Culture, blood (routine x 2)     Status: None (Preliminary result)   Collection Time: 08/28/2020  9:24 PM   Specimen: BLOOD  Result Value Ref Range Status   Specimen Description BLOOD LEFT AC  Final   Special Requests   Final    BOTTLES DRAWN AEROBIC AND ANAEROBIC Blood Culture adequate volume   Culture   Final    NO GROWTH < 12 HOURS Performed at [facility], [address] [place], [place] [address]    Report Status PENDING  Incomplete  Culture, blood (routine x 2)     Status: None (Preliminary result)   Collection Time: 09/04/2020  9:25 PM   Specimen: BLOOD  Result Value Ref Range Status   Specimen Description BLOOD LEFT WRIST  Final   Special Requests   Final    BOTTLES DRAWN AEROBIC AND ANAEROBIC Blood Culture adequate volume   Culture   Final    NO GROWTH < 12 HOURS Performed at [facility], [address], [place], [place] [address]    Report Status PENDING  Incomplete    Coagulation Studies: Recent Labs    17-Sep-2020 0114 2020/09/17 0640 2020/09/17 1232  LABPROT 24.6* 22.9* 23.6*  INR 2.3* 2.1* 2.2*    Urinalysis: Recent Labs    09/10/2020 1120  COLORURINE AMBER*  LABSPEC 1.016  PHURINE 5.0  GLUCOSEU NEGATIVE  HGBUR MODERATE*  BILIRUBINUR NEGATIVE  KETONESUR NEGATIVE  PROTEINUR >=300*  NITRITE NEGATIVE  LEUKOCYTESUR SMALL*      Imaging: DG Abd 1 View  Result Date: 08/23/2020 CLINICAL DATA:  Intubation, orogastric tube placement EXAM: ABDOMEN - 1 VIEW COMPARISON:  Portable exam 1144 hours compared to 03/28/2016 FINDINGS: Tip of nasogastric tube projects over distal gastric antrum. Patchy infiltrates at visualized lower lungs. Bones slightly demineralized. External pacing leads present. IMPRESSION: Tip of orogastric tube projects over distal gastric antrum. Electronically Signed   By: Lavonia Dana M.D.   On: 08/24/2020 12:02   CT HEAD WO CONTRAST  Result Date: 09/01/2020 CLINICAL DATA:  Neuro deficit, acute, stroke suspected. Additional history provided: Unresponsiveness, CPR, fall 2 days ago. EXAM: CT HEAD WITHOUT CONTRAST TECHNIQUE: Contiguous axial images were obtained from the base of the skull through the vertex without intravenous contrast. COMPARISON:  Head CT 03/28/2016. FINDINGS: Brain: Cerebral volume is normal for age. There is no acute intracranial hemorrhage. No demarcated cortical infarct. No extra-axial fluid collection. No evidence of intracranial mass. No midline shift. Partially empty sella turcica. Vascular: No hyperdense vessel Skull: Normal. Negative for fracture or focal lesion. Sinuses/Orbits: Visualized orbits show no acute finding. Mild ethmoid sinus mucosal thickening. Prominent secretions within the nasal passage and nasopharynx. Partially visualized support tubes. Small left mastoid effusion. IMPRESSION: No CT evidence of acute intracranial abnormality. Mild  ethmoid sinus mucosal thickening. Prominent secretions within the nasal passages and nasopharynx in the presence of life-support tubes. Electronically Signed   By: Kellie Simmering DO   On: 08/29/2020 12:40   DG Chest Port 1 View  Result Date: 17-Sep-2020 CLINICAL DATA:  Central line placement EXAM: PORTABLE CHEST 1 VIEW COMPARISON:  08/24/2020 FINDINGS: Support Apparatus: --Endotracheal tube: Tip just above the carina --Enteric tube:Tip and sideport are below the field of view. --Catheter(s):Left internal jugular vein approach central venous catheter tip is in the lower SVC --Other: None Persistent widespread bilateral airspace opacities. Mild cardiomegaly. No pleural abnormality. IMPRESSION: 1. Left internal jugular vein approach central venous catheter tip in the lower SVC. 2. Endotracheal tube tip just above the carina. 3. Unchanged widespread bilateral airspace opacities. Electronically Signed   By: Ulyses Jarred M.D.   On: 07-Sep-2020 03:02   DG Chest Portable 1 View  Result Date: 09/09/2020 CLINICAL DATA:  Intubation, atrial fibrillation, COPD, former smoker EXAM: PORTABLE CHEST 1 VIEW COMPARISON:  Portable exam 1141 hours compared to 05/06/2016 FINDINGS: Tip of endotracheal tube projects 3.5 cm above carina. Nasogastric tube extends into stomach. Enlargement of cardiac silhouette. Mediastinal contours normal. Numerous EKG leads project over chest. Patchy BILATERAL airspace infiltrates consistent with multifocal pneumonia. No pleural effusion or pneumothorax. IMPRESSION: Patchy BILATERAL airspace infiltrates consistent with multifocal pneumonia. Electronically Signed   By: Lavonia Dana M.D.   On: 08/31/2020 12:01      Assessment & Plan: Pt is a 64 y.o. female with a PMHx of atrial fibrillation, COPD, morbid obesity, hypertension, depression, seasonal allergies, chronic lower extremity edema, hypothyroidism who was admitted to [facility] on 08/16/2020 for evaluation of unresponsiveness.  1.  Acute kidney  injury secondary to ATN/chronic kidney disease stage IV baseline creatinine 1.9 with EGFR of 28.  Patient with severe renal dysfunction now.  We have recommended CRRT given critically ill state and patient on hypothermia protocol.  Case discussed with pulmonary/critical care.  Temporary dialysis catheter placed.  Orders for CRT have been placed.  2.  Acute respiratory failure/cardiac arrest.  Patient currently on hypothermia protocol.  Continue ventilatory support.  3.  Hyperkalemia.  Serum potassium high at 5.6.  Patient will be dialyzed against a 2K bath.  4.  Thanks for consultation.

## 2020-09-11 NOTE — Progress Notes (Signed)
Patient remains intubated on vent, Levophed titrated off and patient maintaining BP WDL. PERRL  Fentanyl gtt started and being maintained at 191mcg/hr for patient comfort. Overnight patient received 2 units of PRBC and 1 FFP.  Trialysis catheter placed, family updated by phone, questions answered as asked.  See [facility] for further update.

## 2020-09-11 NOTE — Death Summary Note (Signed)
DEATH SUMMARY   Patient Details  Name: Tasha Long MRN: [id number] DOB: 30-Aug-1956  Admission/Discharge Information   Admit Date:  09-23-20  Date of Death: Date of Death: September 24, 2020  Time of Death: Time of Death: 1954-03-29  Length of Stay: 1  Referring Physician: Ricardo Jericho, NP   Reason(s) for Hospitalization  Cardiac Arrest  Diagnoses  Preliminary cause of death: NSTEMI (non-ST elevated myocardial infarction) [facility]) Secondary Diagnoses (including complications and co-morbidities):  Active Problems:   Cardiac arrest [facility])   Counseling regarding end of life decision making   Severe COPD exacerbation   Severe acute hypoxic hypercapnic respiratory failure    Morbid Obesity   Acute renal failure    Acute toxic metabolic encephalopathy concerning for possible anoxic brain injury   Bilateral lower extremity cellulitis      Brief Hospital Course (including significant findings, care, treatment, and services provided and events leading to death)  Tasha Long is a 64 y.o. year old female with past medical history of COPD and atrial fibrillation on coumadin, and bedbound who presented to [facility] ER on 09/23/2020 via EMS after being found unresponsive by family members during a diaper change.Pt pulseless at home and CPR initiated by her daughter. When EMS arrived on the scene pts initial cardiac rhythm revealed PEA asystole rhythm then V. tach pt required several shocks and several rounds of CPR prior to ROSC (total downtime 35 to 45 minutes).  EMS placed a [facility] airway. Upon arrival to the ER pt in significant respiratory failure, cardiac failure along with acute renal failure and pt noted to have significant bleeding from the GI tract.  [facility] airway exchanged for ETT by ER physician. The decision was made to initiate hypothermia protocol. Pt admitted to ICU per PCCM team.  Upon arrival to ICU pt remained unresponsive concerning for anoxic brain injury due to  prolonged downtime.  On 2020-09-24 pt developed worsening acute renal failure with hyperkalemia, per nephrology recommendations orders placed to initiate CRRT.  However, following discussion with ICU Intensivist pts family decided to transition pt to comfort measures only.  The pt expired on 09-24-2020 at 03/29/1954 with daughter present at bedside.   Pertinent Labs and Studies  Significant Diagnostic Studies DG Abd 1 View  Result Date: 09-23-20 CLINICAL DATA:  Intubation, orogastric tube placement EXAM: ABDOMEN - 1 VIEW COMPARISON:  Portable exam 1144 hours compared to 03/28/2016 FINDINGS: Tip of nasogastric tube projects over distal gastric antrum. Patchy infiltrates at visualized lower lungs. Bones slightly demineralized. External pacing leads present. IMPRESSION: Tip of orogastric tube projects over distal gastric antrum. Electronically Signed   By: Lavonia  M.D.   On: 2020-09-23 12:02   CT HEAD WO CONTRAST  Result Date: 09/23/2020 CLINICAL DATA:  Neuro deficit, acute, stroke suspected. Additional history provided: Unresponsiveness, CPR, fall 2 days ago. EXAM: CT HEAD WITHOUT CONTRAST TECHNIQUE: Contiguous axial images were obtained from the base of the skull through the vertex without intravenous contrast. COMPARISON:  Head CT 03/28/2016. FINDINGS: Brain: Cerebral volume is normal for age. There is no acute intracranial hemorrhage. No demarcated cortical infarct. No extra-axial fluid collection. No evidence of intracranial mass. No midline shift. Partially empty sella turcica. Vascular: No hyperdense vessel Skull: Normal. Negative for fracture or focal lesion. Sinuses/Orbits: Visualized orbits show no acute finding. Mild ethmoid sinus mucosal thickening. Prominent secretions within the nasal passage and nasopharynx. Partially visualized support tubes. Small left mastoid effusion. IMPRESSION: No CT evidence of acute intracranial abnormality. Mild ethmoid sinus mucosal thickening.  Prominent secretions  within the nasal passages and nasopharynx in the presence of life-support tubes. Electronically Signed   By: Kellie Simmering DO   On: 09/09/2020 12:40   DG Chest Port 1 View  Result Date: 09-24-2020 CLINICAL DATA:  Central line placement EXAM: PORTABLE CHEST 1 VIEW COMPARISON:  08/31/2020 FINDINGS: Support Apparatus: --Endotracheal tube: Tip just above the carina --Enteric tube:Tip and sideport are below the field of view. --Catheter(s):Left internal jugular vein approach central venous catheter tip is in the lower SVC --Other: None Persistent widespread bilateral airspace opacities. Mild cardiomegaly. No pleural abnormality. IMPRESSION: 1. Left internal jugular vein approach central venous catheter tip in the lower SVC. 2. Endotracheal tube tip just above the carina. 3. Unchanged widespread bilateral airspace opacities. Electronically Signed   By: Ulyses Jarred M.D.   On: 09/24/20 03:02   DG Chest Portable 1 View  Result Date: 08/31/2020 CLINICAL DATA:  Intubation, atrial fibrillation, COPD, former smoker EXAM: PORTABLE CHEST 1 VIEW COMPARISON:  Portable exam 1141 hours compared to 05/06/2016 FINDINGS: Tip of endotracheal tube projects 3.5 cm above carina. Nasogastric tube extends into stomach. Enlargement of cardiac silhouette. Mediastinal contours normal. Numerous EKG leads project over chest. Patchy BILATERAL airspace infiltrates consistent with multifocal pneumonia. No pleural effusion or pneumothorax. IMPRESSION: Patchy BILATERAL airspace infiltrates consistent with multifocal pneumonia. Electronically Signed   By: Lavonia  M.D.   On: 08/28/2020 12:01    Microbiology Recent Results (from the past 240 hour(s))  SARS Coronavirus 2 by RT PCR (hospital order, performed in [facility] hospital lab) Nasopharyngeal Nasopharyngeal Swab     Status: None   Collection Time: 09/07/2020 11:20 AM   Specimen: Nasopharyngeal Swab  Result Value Ref Range Status   SARS Coronavirus 2 NEGATIVE NEGATIVE Final     Comment: (NOTE) SARS-CoV-2 target nucleic acids are NOT DETECTED.  The SARS-CoV-2 RNA is generally detectable in upper and lower respiratory specimens during the acute phase of infection. The lowest concentration of SARS-CoV-2 viral copies this assay can detect is 250 copies / mL. A negative result does not preclude SARS-CoV-2 infection and should not be used as the sole basis for treatment or other patient management decisions.  A negative result may occur with improper specimen collection / handling, submission of specimen other than nasopharyngeal swab, presence of viral mutation(s) within the areas targeted by this assay, and inadequate number of viral copies (<250 copies / mL). A negative result must be combined with clinical observations, patient history, and epidemiological information.  Fact Sheet for Patients:   StrictlyIdeas.no  Fact Sheet for Healthcare Providers: BankingDealers.co.za  This test is not yet approved or  cleared by the [place] FDA and has been authorized for detection and/or diagnosis of SARS-CoV-2 by FDA under an Emergency Use Authorization (EUA).  This EUA will remain in effect (meaning this test can be used) for the duration of the COVID-19 declaration under Section 564(b)(1) of the Act, 21 U.S.C. section 360bbb-3(b)(1), unless the authorization is terminated or revoked sooner.  Performed at [facility], [address] [place], [place] [address]   MRSA PCR Screening     Status: Abnormal   Collection Time: 09/09/2020  4:28 PM   Specimen: Nasopharyngeal  Result Value Ref Range Status   MRSA by PCR POSITIVE (A) NEGATIVE Final    Comment:        The GeneXpert MRSA Assay (FDA approved for NASAL specimens only), is one component of a comprehensive MRSA colonization surveillance program. It is not intended  to diagnose MRSA infection nor to guide or monitor treatment for MRSA  infections. RESULT CALLED TO, READ BACK BY AND VERIFIED WITH: [facility] MOORE 08/17/2020 AT 1735 BY ACR Performed at [facility], [address] [place], [place] [address]   Culture, blood (routine x 2)     Status: None (Preliminary result)   Collection Time: 09/03/2020  9:24 PM   Specimen: BLOOD  Result Value Ref Range Status   Specimen Description BLOOD LEFT [facility]  Final   Special Requests   Final    BOTTLES DRAWN AEROBIC AND ANAEROBIC Blood Culture adequate volume   Culture   Final    NO GROWTH < 12 HOURS Performed at [facility], [address] [place], [place] [address]    Report Status PENDING  Incomplete  Culture, blood (routine x 2)     Status: None (Preliminary result)   Collection Time: 08/29/2020  9:25 PM   Specimen: BLOOD  Result Value Ref Range Status   Specimen Description BLOOD LEFT WRIST  Final   Special Requests   Final    BOTTLES DRAWN AEROBIC AND ANAEROBIC Blood Culture adequate volume   Culture   Final    NO GROWTH < 12 HOURS Performed at [facility], [address] [place], [place] [address]    Report Status PENDING  Incomplete    Lab Basic Metabolic Panel: Recent Labs  Lab 09/07/2020 1120 08/22/2020 1550 2020-09-19 0640 09-19-20 1232 Sep 19, 2020 1543 September 19, 2020 1824  NA 133* 133*  --  133* 135 135  K 5.7* 5.9*  --  5.6* 5.5* 5.5*  CL 93* 91*  --  93* 94* 94*  CO2 21* 24  --  28 29 30   GLUCOSE 96 104*  --  83 80 83  BUN 80* 84*  --  92* 92* 95*  CREATININE 4.17* 4.64*  --  4.79* 4.78* 4.84*  CALCIUM 7.6* 7.9*  --  7.3* 7.4* 7.3*  MG  --   --  2.5*  --   --   --   PHOS  --   --  7.0*  --  6.9*  --    Liver Function Tests: Recent Labs  Lab 08/19/2020 1120 Sep 19, 2020 1543  AST 58*  --   ALT 21  --   ALKPHOS 48  --   BILITOT 0.9  --   PROT 5.6*  --   ALBUMIN 2.2* 2.0*   No results for input(s): LIPASE, AMYLASE in the last 168 hours. No results for input(s): AMMONIA in the last 168 hours. CBC: Recent Labs  Lab 08/27/2020 1120  2020-09-19 0114 09/19/2020 0640 09/19/2020 1232 09-19-2020 1824  WBC 11.9*  --  9.9  --   --   NEUTROABS 8.4*  --  7.8*  --   --   HGB 3.1* 6.1* 6.5* 6.2* 6.6*  HCT 11.6* 19.7* 20.6* 19.0* 20.3*  MCV 66.7*  --  73.8*  --   --   PLT 476*  --  316  --   --    Cardiac Enzymes: No results for input(s): CKTOTAL, CKMB, CKMBINDEX, TROPONINI in the last 168 hours. Sepsis Labs: Recent Labs  Lab 09/02/2020 1120 2020-09-19 0640  WBC 11.9* 9.9    Procedures/Operations  Mechanical Intubation  Left internal jugular trialysis catheter   Marda Stalker, [place] Pager [phone] (please enter 7 digits) PCCM Consult Pager [phone] (please enter 7 digits)

## 2020-09-11 NOTE — Procedures (Signed)
Central Venous Catheter Insertion Procedure Note  Kenlea Rylee Huestis  [id number]  02/28/1956  Date:09/24/20  Time:2:23 AM   Provider Performing:Tedra Coppernoll Shauna Hugh   Procedure: Insertion of Non-tunneled Central Venous Catheter(36556)with US guidance (20254)    Indication(s) Medication administration and Hemodialysis  Consent Risks of the procedure as well as the alternatives and risks of each were explained to the patient and/or caregiver.  Consent for the procedure was obtained and is signed in the bedside chart  Anesthesia Topical only with 1% lidocaine   Timeout Verified patient identification, verified procedure, site/side was marked, verified correct patient position, special equipment/implants available, medications/allergies/relevant history reviewed, required imaging and test results available.  Sterile Technique Maximal sterile technique including full sterile barrier drape, hand hygiene, sterile gown, sterile gloves, mask, hair covering, sterile ultrasound probe cover (if used).  Procedure Description Area of catheter insertion was cleaned with chlorhexidine and draped in sterile fashion.   With real-time ultrasound guidance a HD catheter was placed into the left internal jugular vein.  Nonpulsatile blood flow and easy flushing noted in all ports.  The catheter was sutured in place and sterile dressing applied.  Complications/Tolerance None; patient tolerated the procedure well. Chest X-ray is ordered to verify placement for internal jugular or subclavian cannulation.  Chest x-ray is not ordered for femoral cannulation.  EBL Minimal  Specimen(s) None  Marda Stalker, [place] Pager [phone] (please enter 7 digits) PCCM Consult Pager [phone] (please enter 7 digits)

## 2020-09-11 NOTE — Progress Notes (Addendum)
Name: Tasha Long MRN: [id number] DOB: 1956-02-19    SYNOPSIS 64 y.o. female with a past medical history of COPD and atrial fibrillation on Coumadin bedbound who presents via EMS after being found unresponsive by family members during a diaper change just prior to arrival.    CPR started by daughter approximate downtime 35 minutes EMS showed a PEA asystole rhythm then V. tach with several shocks given Several rounds of CPR were administered and return of spontaneous circulation is reported to have returned within 35 to 45 minutes  At this time patient is in multiorgan failure with significant respiratory and cardiac failure along with acute renal failure with significant bleeding from the GI tract.  The decision was made to initiate hypothermia protocol Prognosis is very poor at this time with multiorgan failure with a very high risk for cardiac arrest and death   EVENTS 09/01/2023 admitted for acute CARDIAC ARREST Pt admitted to ICU mechanically intubated and hypothermic protocol initiated at 36 degrees C 2023/09/01 FAMILY UPDATED, TRIALYSIS PLACED 9/15 Multiorgan failure  CHIEF COMPLAINT: follow up Cardiac arrest  HISTORY OF PRESENT ILLNESS:   Severe multiorgan failure Cariogenic shock Wean off pressors Severe resp failure   REVIEW OF SYSTEMS:   Unable to obtain due to critical illness     Estimated body mass index is 66.55 kg/m as calculated from the following:   Height as of this encounter: 5\' 3"  (1.6 m).   Weight as of this encounter: 170.4 kg.    VITAL SIGNS: Temp:  [91.9 F (33.3 C)-97 F (36.1 C)] 97 F (36.1 C) (09/15 0300) Pulse Rate:  [58-91] 73 (09/15 0300) Resp:  [13-25] 17 (09/15 0300) BP: (90-158)/(32-64) 152/51 (09/15 0300) SpO2:  [90 %-100 %] 99 % (09/15 0300) FiO2 (%):  [35 %-40 %] 35 % (09/15 0233) Weight:  [170.4 kg-175 kg] 170.4 kg 09/01/2023 1541)   I/O last 3 completed shifts: In: 595.9 [I.V.:549.3; IV Piggyback:46.7] Out: 106  [Urine:6; Emesis/NG output:100] Total I/O In: 1985.6 [I.V.:325.6; Blood:1610; IV Piggyback:50] Out: 525 [Urine:175; Emesis/NG output:350]   SpO2: 99 % FiO2 (%): 35 %   Physical Examination:  GENERAL:critically ill appearing, +resp distress HEAD: Normocephalic, atraumatic.  EYES: Pupils equal, round, reactive to light.  No scleral icterus.  MOUTH: Moist mucosal membrane. NECK: Supple. No JVD.  PULMONARY: +rhonchi, +wheezing, slightly tachypneic  CARDIOVASCULAR: S1 and S2. Regular rate and rhythm. No murmurs, rubs, or gallops.  GASTROINTESTINAL: Soft, nontender, -distended.  Positive bowel sounds.  MUSCULOSKELETAL: 2+ bilateral lowe extremity edema  NEUROLOGIC: sedated, not following commands  SKIN:bilateral lower extremity cellulitis with vesicles and dried blood   MEDICATIONS: I have reviewed all medications and confirmed regimen as documented   CULTURE RESULTS   Recent Results (from the past 240 hour(s))  SARS Coronavirus 2 by RT PCR (hospital order, performed in [facility] hospital lab) Nasopharyngeal Nasopharyngeal Swab     Status: None   Collection Time: 31-Aug-2020 11:20 AM   Specimen: Nasopharyngeal Swab  Result Value Ref Range Status   SARS Coronavirus 2 NEGATIVE NEGATIVE Final    Comment: (NOTE) SARS-CoV-2 target nucleic acids are NOT DETECTED.  The SARS-CoV-2 RNA is generally detectable in upper and lower respiratory specimens during the acute phase of infection. The lowest concentration of SARS-CoV-2 viral copies this assay can detect is 250 copies / mL. A negative result does not preclude SARS-CoV-2 infection and should not be used as the sole basis for treatment or other patient management decisions.  A negative result may occur  with improper specimen collection / handling, submission of specimen other than nasopharyngeal swab, presence of viral mutation(s) within the areas targeted by this assay, and inadequate number of viral copies (<250 copies / mL). A  negative result must be combined with clinical observations, patient history, and epidemiological information.  Fact Sheet for Patients:   StrictlyIdeas.no  Fact Sheet for Healthcare Providers: BankingDealers.co.za  This test is not yet approved or  cleared by the [place] FDA and has been authorized for detection and/or diagnosis of SARS-CoV-2 by FDA under an Emergency Use Authorization (EUA).  This EUA will remain in effect (meaning this test can be used) for the duration of the COVID-19 declaration under Section 564(b)(1) of the Act, 21 U.S.C. section 360bbb-3(b)(1), unless the authorization is terminated or revoked sooner.  Performed at [facility], [address] [place], [place] [address]   MRSA PCR Screening     Status: Abnormal   Collection Time: 09/01/2020  4:28 PM   Specimen: Nasopharyngeal  Result Value Ref Range Status   MRSA by PCR POSITIVE (A) NEGATIVE Final    Comment:        The GeneXpert MRSA Assay (FDA approved for NASAL specimens only), is one component of a comprehensive MRSA colonization surveillance program. It is not intended to diagnose MRSA infection nor to guide or monitor treatment for MRSA infections. RESULT CALLED TO, READ BACK BY AND VERIFIED WITH: [facility] [place]ORE 08/31/2020 AT 1735 BY ACR Performed at [facility], [place], [place] [address]           IMAGING    DG Abd 1 View  Result Date: 09/03/2020 CLINICAL DATA:  Intubation, orogastric tube placement EXAM: ABDOMEN - 1 VIEW COMPARISON:  Portable exam 1144 hours compared to 03/28/2016 FINDINGS: Tip of nasogastric tube projects over distal gastric antrum. Patchy infiltrates at visualized lower lungs. Bones slightly demineralized. External pacing leads present. IMPRESSION: Tip of orogastric tube projects over distal gastric antrum. Electronically Signed   By: Lavonia Dana M.D.   On: 08/29/2020 12:02   CT HEAD  WO CONTRAST  Result Date: 08/18/2020 CLINICAL DATA:  Neuro deficit, acute, stroke suspected. Additional history provided: Unresponsiveness, CPR, fall 2 days ago. EXAM: CT HEAD WITHOUT CONTRAST TECHNIQUE: Contiguous axial images were obtained from the base of the skull through the vertex without intravenous contrast. COMPARISON:  Head CT 03/28/2016. FINDINGS: Brain: Cerebral volume is normal for age. There is no acute intracranial hemorrhage. No demarcated cortical infarct. No extra-axial fluid collection. No evidence of intracranial mass. No midline shift. Partially empty sella turcica. Vascular: No hyperdense vessel Skull: Normal. Negative for fracture or focal lesion. Sinuses/Orbits: Visualized orbits show no acute finding. Mild ethmoid sinus mucosal thickening. Prominent secretions within the nasal passage and nasopharynx. Partially visualized support tubes. Small left mastoid effusion. IMPRESSION: No CT evidence of acute intracranial abnormality. Mild ethmoid sinus mucosal thickening. Prominent secretions within the nasal passages and nasopharynx in the presence of life-support tubes. Electronically Signed   By: Kellie Simmering DO   On: 09/10/2020 12:40   DG Chest Port 1 View  Result Date: 2020/09/09 CLINICAL DATA:  Central line placement EXAM: PORTABLE CHEST 1 VIEW COMPARISON:  08/15/2020 FINDINGS: Support Apparatus: --Endotracheal tube: Tip just above the carina --Enteric tube:Tip and sideport are below the field of view. --Catheter(s):Left internal jugular vein approach central venous catheter tip is in the lower SVC --Other: None Persistent widespread bilateral airspace opacities. Mild cardiomegaly. No pleural abnormality. IMPRESSION: 1. Left internal jugular vein  approach central venous catheter tip in the lower SVC. 2. Endotracheal tube tip just above the carina. 3. Unchanged widespread bilateral airspace opacities. Electronically Signed   By: Ulyses Jarred M.D.   On: 08-31-2020 03:02   DG Chest  Portable 1 View  Result Date: 08/21/2020 CLINICAL DATA:  Intubation, atrial fibrillation, COPD, former smoker EXAM: PORTABLE CHEST 1 VIEW COMPARISON:  Portable exam 1141 hours compared to 05/06/2016 FINDINGS: Tip of endotracheal tube projects 3.5 cm above carina. Nasogastric tube extends into stomach. Enlargement of cardiac silhouette. Mediastinal contours normal. Numerous EKG leads project over chest. Patchy BILATERAL airspace infiltrates consistent with multifocal pneumonia. No pleural effusion or pneumothorax. IMPRESSION: Patchy BILATERAL airspace infiltrates consistent with multifocal pneumonia. Electronically Signed   By: Lavonia Dana M.D.   On: 09/05/2020 12:01     Nutrition Status:            Indwelling Urinary Catheter continued, requirement due to   Reason to continue Indwelling Urinary Catheter strict Intake/Output monitoring for hemodynamic instability   Central Line/ continued, requirement due to  Reason to continue [place] of central venous pressure or other hemodynamic parameters and poor IV access   Ventilator continued, requirement due to severe respiratory failure   Ventilator Sedation RASS 0 to -2     ASSESSMENT AND PLAN SYNOPSIS  64 year old morbidly obese white female with multiple medical issues with acute blood loss anemia leading to acute and severe cardiac arrest and cardiac death leading to acute and severe respiratory failure with hypoxia with multiorgan failure in the setting of probable anoxic brain injury with progressive renal failure    Severe ACUTE Hypoxic and Hypercapnic Respiratory Failure -continue Full MV support -continue Bronchodilator Therapy -Wean Fio2 and PEEP as tolerated -VAP/VENT bundle implementation  ACUTE CARDIAC FAILURE-diagnosis NSTEMI Cardiogenic and hypovolemic shock  Unable to anticoagulate at this time due to massive GI bleeding Continuous telemetry monitoring  Prn levophed gtt and fluid resuscitation to  maintain map >65  SEVERE COPD EXACERBATION Empiric antibiotics Steroids BD therapy  Morbid obesity, possible OSA.   Will certainly impact respiratory mechanics, ventilator weaning Suspect will need to consider additional PEEP  ACUTE KIDNEY INJURY/Renal Failure -continue Foley Catheter-assess need -Avoid nephrotoxic agents -Follow urine output, BMP -Ensure adequate renal perfusion, optimize oxygenation -Renal dose medications  NEUROLOGY Acute toxic metabolic encephalopathy-findings may consist be consistent with anoxic brain injury With sedatives at this time  ID Bilateral lower extremity cellulitis  -continue IV abx as prescibed -follow up cultures  GI GI PROPHYLAXIS as indicated  NUTRITIONAL STATUS DIET-->NPO Constipation protocol as indicated   ENDO - will use ICU hypoglycemic\Hyperglycemia protocol if needed    ELECTROLYTES -follow labs as needed -replace as needed -pharmacy consultation and following    DVT/GI PRX ordered and assessed TRANSFUSIONS AS NEEDED MONITOR FSBS I Assessed the need for Labs I Assessed the need for Foley I Assessed the need for Central Venous Line Family Discussion when available I Assessed the need for Mobilization I made an Assessment of medications to be adjusted accordingly Safety Risk assessment Completed   Overall, patient is critically ill, prognosis is guarded.  Patient with Multiorgan failure and at high risk for cardiac arrest and death.    Marda Stalker, [place] Pager [phone] (please enter 7 digits) [place] Pager [phone] (please enter 7 digits)     PCCM ATTENDING ATTESTATION:  I have evaluated patient with the APP, I personally  reviewed database in its entirety and discussed care plan in detail.  In addition, this patient was discussed on multidisciplinary rounds.   I agree with assessment and plan.  Cardiac arrest due to ABLA and NTEMI HYPOTHERMIA  PROTOCOL  MULTIORGAN FAILURE PROGNOSIS IS POOR  Critical Care Time devoted to patient care services described in this note is 45 minutes.   Overall, patient is critically ill, prognosis is guarded.  Patient with Multiorgan failure and at high risk for cardiac arrest and death.    Corrin Parker, M.D.  [facility]  Medical Director [place] Director [facility] Cardio-Pulmonary Department

## 2020-09-11 NOTE — Progress Notes (Signed)
eeg done °

## 2020-09-11 DEATH — deceased

## 2020-11-27 IMAGING — DX DG CHEST 1V PORT
1 series · 1 of 1 positions shown · non-contrast
Comparison: Portable exam 3373 hours compared to 05/06/2016

CLINICAL DATA: Intubation, atrial fibrillation, COPD, former smoker

EXAM:
PORTABLE CHEST 1 VIEW

[chest ap]
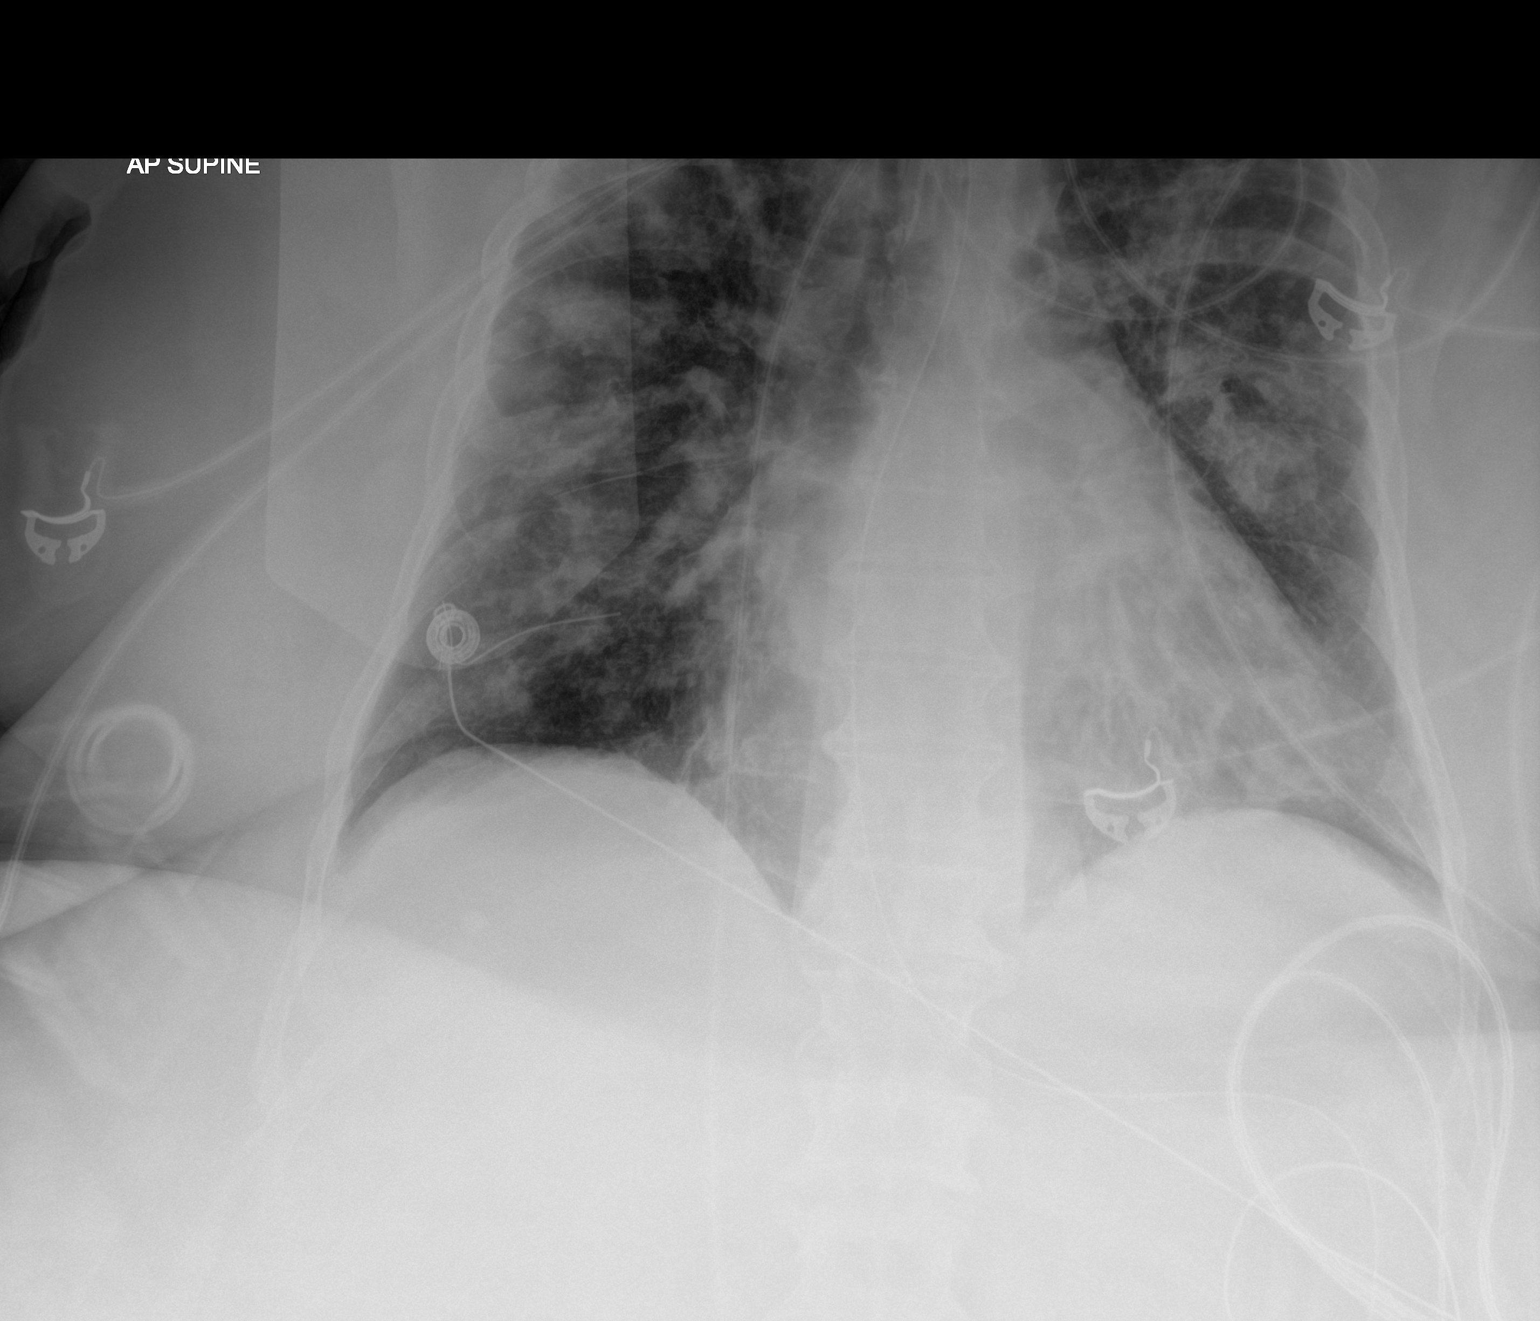

[1 of 1 positions shown; findings below may reference images not displayed]

FINDINGS: Tip of endotracheal tube projects 3.5 cm above carina.

Nasogastric tube extends into stomach.

Enlargement of cardiac silhouette.

Mediastinal contours normal.

Numerous EKG leads project over chest.

Patchy BILATERAL airspace infiltrates consistent with multifocal
pneumonia.

No pleural effusion or pneumothorax.
IMPRESSION: Patchy BILATERAL airspace infiltrates consistent with multifocal
pneumonia.
